# Patient Record
Sex: Female | Born: 1950
Health system: Southern US, Community
[De-identification: ages and names within clinical notes are randomized; demographics above are authoritative.]

## PROBLEM LIST (undated history)

## (undated) DIAGNOSIS — K219 Gastro-esophageal reflux disease without esophagitis: Secondary | ICD-10-CM

## (undated) DIAGNOSIS — M1990 Unspecified osteoarthritis, unspecified site: Secondary | ICD-10-CM

## (undated) DIAGNOSIS — G2581 Restless legs syndrome: Secondary | ICD-10-CM

## (undated) DIAGNOSIS — K589 Irritable bowel syndrome without diarrhea: Secondary | ICD-10-CM

## (undated) DIAGNOSIS — H269 Unspecified cataract: Secondary | ICD-10-CM

## (undated) DIAGNOSIS — Z98811 Dental restoration status: Secondary | ICD-10-CM

## (undated) DIAGNOSIS — M2041 Other hammer toe(s) (acquired), right foot: Secondary | ICD-10-CM

## (undated) DIAGNOSIS — G373 Acute transverse myelitis in demyelinating disease of central nervous system: Secondary | ICD-10-CM

## (undated) HISTORY — DX: Irritable bowel syndrome, unspecified: K58.9

## (undated) HISTORY — PX: COLONOSCOPY: SHX174

## (undated) HISTORY — PX: MIDDLE EAR SURGERY: SHX713

## (undated) HISTORY — DX: Acute transverse myelitis in demyelinating disease of central nervous system: G37.3

## (undated) HISTORY — DX: Restless legs syndrome: G25.81

## (undated) HISTORY — PX: SYNOVIAL CYST EXCISION: SUR507

## (undated) HISTORY — PX: POLYPECTOMY: SHX149

## (undated) HISTORY — DX: Gastro-esophageal reflux disease without esophagitis: K21.9

## (undated) HISTORY — PX: BUNIONECTOMY: SHX129

## (undated) HISTORY — PX: FOOT SURGERY: SHX648

---

## 2003-04-27 ENCOUNTER — Other Ambulatory Visit: Admission: RE | Admit: 2003-04-27 | Discharge: 2003-04-27 | Payer: Self-pay | Admitting: Obstetrics and Gynecology

## 2004-09-11 ENCOUNTER — Other Ambulatory Visit: Admission: RE | Admit: 2004-09-11 | Discharge: 2004-09-11 | Payer: Self-pay | Admitting: Obstetrics and Gynecology

## 2005-03-19 ENCOUNTER — Encounter: Admission: RE | Admit: 2005-03-19 | Discharge: 2005-03-19 | Payer: Self-pay | Admitting: *Deleted

## 2005-10-14 ENCOUNTER — Encounter: Admission: RE | Admit: 2005-10-14 | Discharge: 2005-10-14 | Payer: Self-pay | Admitting: Obstetrics and Gynecology

## 2005-11-25 ENCOUNTER — Other Ambulatory Visit: Admission: RE | Admit: 2005-11-25 | Discharge: 2005-11-25 | Payer: Self-pay | Admitting: Obstetrics and Gynecology

## 2006-10-15 ENCOUNTER — Encounter: Admission: RE | Admit: 2006-10-15 | Discharge: 2006-10-15 | Payer: Self-pay | Admitting: Obstetrics & Gynecology

## 2006-12-15 DIAGNOSIS — K219 Gastro-esophageal reflux disease without esophagitis: Secondary | ICD-10-CM

## 2006-12-15 HISTORY — DX: Gastro-esophageal reflux disease without esophagitis: K21.9

## 2007-01-06 ENCOUNTER — Other Ambulatory Visit: Admission: RE | Admit: 2007-01-06 | Discharge: 2007-01-06 | Payer: Self-pay | Admitting: Obstetrics & Gynecology

## 2007-02-01 ENCOUNTER — Ambulatory Visit: Payer: Self-pay | Admitting: Vascular Surgery

## 2007-02-11 ENCOUNTER — Ambulatory Visit: Payer: Self-pay | Admitting: Vascular Surgery

## 2007-04-01 ENCOUNTER — Ambulatory Visit: Payer: Self-pay | Admitting: Vascular Surgery

## 2007-10-18 ENCOUNTER — Encounter: Admission: RE | Admit: 2007-10-18 | Discharge: 2007-10-18 | Payer: Self-pay | Admitting: Obstetrics & Gynecology

## 2008-01-19 ENCOUNTER — Other Ambulatory Visit: Admission: RE | Admit: 2008-01-19 | Discharge: 2008-01-19 | Payer: Self-pay | Admitting: Obstetrics & Gynecology

## 2008-10-23 ENCOUNTER — Encounter: Admission: RE | Admit: 2008-10-23 | Discharge: 2008-10-23 | Payer: Self-pay | Admitting: Obstetrics & Gynecology

## 2009-02-01 ENCOUNTER — Other Ambulatory Visit: Admission: RE | Admit: 2009-02-01 | Discharge: 2009-02-01 | Payer: Self-pay | Admitting: Obstetrics and Gynecology

## 2011-10-28 ENCOUNTER — Encounter (HOSPITAL_BASED_OUTPATIENT_CLINIC_OR_DEPARTMENT_OTHER)
Admission: RE | Admit: 2011-10-28 | Discharge: 2011-10-28 | Disposition: A | Discharge status: Home / Self Care | Payer: BC Managed Care – PPO | Source: Ambulatory Visit | Attending: Orthopedic Surgery | Admitting: Orthopedic Surgery

## 2011-10-28 ENCOUNTER — Other Ambulatory Visit: Payer: Self-pay

## 2011-10-28 ENCOUNTER — Encounter (HOSPITAL_BASED_OUTPATIENT_CLINIC_OR_DEPARTMENT_OTHER): Payer: Self-pay | Admitting: *Deleted

## 2011-10-28 NOTE — H&P (Signed)
  HISTORY  OF  PRESENT  ILLNESS:  Gail Gilbert  presents  to  the  office  today in  consultation  from  Dr. Althea Charon for a left second toe hammertoe that has been present for the last year and has been somewhat progressive.  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  Otherwise, she is healthy.  She has had a couple of bunion corrections in the past, done by a podiatrist down in [facility].  She is here to discuss  getting  the hammertoe released.  It does have a dorsal  callous.   No  ulceration, but  it  bothers  her and prevents her from wearing most types of shoes.  No fevers or chills or wounds.  The toe next to it, the middle  toe on  the left foot,  does have  a small mallet  deformity.   She also  has  occasional numbness and tingling in the foot that sounds like an early Morton's neuroma, but it is not bad enough where she desires any specific treatment for it.  PAST MEDICAL HISTORY:  Restless leg syndrome, as well as bursitis in her right hip.  PAST SURGICAL HISTORY:  Bunionectomy x2 in both the right and the left foot in 2002 and 2009. Lower back surgery in addition to right ear surgery in 1999.  MEDICATIONS:  Meloxicam, omeprazole, Citrulline, Prometrium, Estradiol, Requip and a multivitamin.  ALLERGIES:  None.  FAMILY HISTORY:  Coronary artery disease, as well as cervical arthritis.  SOCIAL HISTORY:  The patient does drink alcohol occasionally.  She denies tobacco use.  She generally works as a Haematologist, but as mentioned above has been out of work since 01/04/2010.  REVIEW OF SYSTEMS:  An extensive review of systems was conducted and is significant for insomnia, heart palpitations, joint pains, muscle aches, arthritis, bladder control problems in addition to frequent and urgent urination, poor balance and anxiety.  The patient has been pregnant 4 times and has a total of 5 live births.  OBJECTIVE:  The patient is a well-developed and well-nourished female in no acute  distress.  Eyes not dilated.  No use of accessory muscles for breathing.  No rashes on the skin.  She is alert and oriented x3 The  left  second  toe  does  have  a  fairly  impressive  hammer  deformity  with  about  a  45  degree  fixed contracture  at  the  PIP  joint.   Neurovascular  status  is  intact.   No  cuts, scrapes,  or  abrasions  of  the  skin. There is a distal callous, but again it has not worn through.  IMPRESSION:  Left foot second toe hammertoe, fixed, with about a 40 degree flexion contracture and mild symptoms consistent with a Morton's neuroma that do not require surgical intervention at this time.  PLAN:   We  will  get  her  set  up  for  hammertoe  release  using  a  Duvries  arthroplasty  technique.  I  will reassess her in the office at the time of surgery.  I filled out a surgery sheet and gave it to [place] and she may call Olegario Messier to set up the surgery at her convenience.

## 2011-10-28 NOTE — Progress Notes (Signed)
To come in for ekg- 

## 2011-10-29 ENCOUNTER — Encounter (HOSPITAL_BASED_OUTPATIENT_CLINIC_OR_DEPARTMENT_OTHER)
Admission: RE | Disposition: A | Discharge status: Home / Self Care | Payer: Self-pay | Source: Ambulatory Visit | Attending: Orthopedic Surgery

## 2011-10-29 ENCOUNTER — Ambulatory Visit (HOSPITAL_BASED_OUTPATIENT_CLINIC_OR_DEPARTMENT_OTHER)
Admission: RE | Admit: 2011-10-29 | Discharge: 2011-10-29 | Disposition: A | Discharge status: Home / Self Care | Payer: BC Managed Care – PPO | Source: Ambulatory Visit | Attending: Orthopedic Surgery | Admitting: Orthopedic Surgery

## 2011-10-29 ENCOUNTER — Encounter (HOSPITAL_BASED_OUTPATIENT_CLINIC_OR_DEPARTMENT_OTHER): Payer: Self-pay | Admitting: Anesthesiology

## 2011-10-29 ENCOUNTER — Encounter (HOSPITAL_BASED_OUTPATIENT_CLINIC_OR_DEPARTMENT_OTHER): Payer: Self-pay | Admitting: *Deleted

## 2011-10-29 ENCOUNTER — Ambulatory Visit (HOSPITAL_BASED_OUTPATIENT_CLINIC_OR_DEPARTMENT_OTHER): Payer: BC Managed Care – PPO | Admitting: Anesthesiology

## 2011-10-29 DIAGNOSIS — M2040 Other hammer toe(s) (acquired), unspecified foot: Secondary | ICD-10-CM

## 2011-10-29 DIAGNOSIS — Z01810 Encounter for preprocedural cardiovascular examination: Secondary | ICD-10-CM | POA: Insufficient documentation

## 2011-10-29 HISTORY — PX: HAMMER TOE SURGERY: SHX385

## 2011-10-29 SURGERY — CORRECTION, HAMMER TOE
Anesthesia: Choice | Laterality: Left | Wound class: Clean

## 2011-10-29 MED ORDER — CEFAZOLIN SODIUM 1-5 GM-% IV SOLN
INTRAVENOUS | Status: DC | PRN
  Administered 2011-10-29: 1 g via INTRAVENOUS

## 2011-10-29 MED ORDER — CHLORHEXIDINE GLUCONATE 4 % EX LIQD: 60.0000 mL | Freq: Once | CUTANEOUS | Status: DC

## 2011-10-29 MED ORDER — PROPOFOL 10 MG/ML IV EMUL
INTRAVENOUS | Status: DC | PRN
  Administered 2011-10-29: 200 mg via INTRAVENOUS

## 2011-10-29 MED ORDER — BUPIVACAINE HCL (PF) 0.5 % IJ SOLN
INTRAMUSCULAR | Status: DC | PRN
  Administered 2011-10-29: 4 mL

## 2011-10-29 MED ORDER — DEXTROSE-NACL 5-0.45 % IV SOLN: INTRAVENOUS | Status: DC

## 2011-10-29 MED ORDER — HYDROCODONE-ACETAMINOPHEN 5-325 MG PO TABS: 1.0000 | ORAL_TABLET | ORAL | Status: AC | PRN

## 2011-10-29 MED ORDER — FENTANYL CITRATE 0.05 MG/ML IJ SOLN
INTRAMUSCULAR | Status: DC | PRN
  Administered 2011-10-29 (×2): 50 ug via INTRAVENOUS

## 2011-10-29 MED ORDER — MIDAZOLAM HCL 5 MG/5ML IJ SOLN
INTRAMUSCULAR | Status: DC | PRN
  Administered 2011-10-29: 2 mg via INTRAVENOUS

## 2011-10-29 MED ORDER — LIDOCAINE HCL (CARDIAC) 20 MG/ML IV SOLN
INTRAVENOUS | Status: DC | PRN
  Administered 2011-10-29: 60 mg via INTRAVENOUS

## 2011-10-29 MED ORDER — LACTATED RINGERS IV SOLN
INTRAVENOUS | Status: DC
  Administered 2011-10-29 (×3): via INTRAVENOUS

## 2011-10-29 MED ORDER — ONDANSETRON HCL 4 MG/2ML IJ SOLN
INTRAMUSCULAR | Status: DC | PRN
  Administered 2011-10-29: 4 mg via INTRAVENOUS

## 2011-10-29 MED ORDER — CEFAZOLIN SODIUM 1-5 GM-% IV SOLN: 1.0000 g | INTRAVENOUS | Status: DC

## 2011-10-29 MED ORDER — DEXAMETHASONE SODIUM PHOSPHATE 4 MG/ML IJ SOLN
INTRAMUSCULAR | Status: DC | PRN
  Administered 2011-10-29: 10 mg via INTRAVENOUS

## 2011-10-29 SURGICAL SUPPLY — 26 items
BANDAGE ELASTIC 3 VELCRO ST LF (GAUZE/BANDAGES/DRESSINGS) ×2 IMPLANT
BANDAGE ELASTIC 4 VELCRO ST LF (GAUZE/BANDAGES/DRESSINGS) ×2 IMPLANT
BANDAGE ELASTIC 6 VELCRO ST LF (GAUZE/BANDAGES/DRESSINGS) IMPLANT
BLADE OSC/SAG .038X5.5 CUT EDG (BLADE) ×2 IMPLANT
BLADE SURG 15 STRL LF DISP TIS (BLADE) ×1 IMPLANT
BLADE SURG 15 STRL SS (BLADE) ×1
BNDG ESMARK 4X9 LF (GAUZE/BANDAGES/DRESSINGS) ×2 IMPLANT
CHLORAPREP W/TINT 26ML (MISCELLANEOUS) ×2 IMPLANT
CLOTH BEACON ORANGE TIMEOUT ST (SAFETY) ×2 IMPLANT
COVER TABLE BACK 60X90 (DRAPES) ×2 IMPLANT
DRAPE EXTREMITY T 121X128X90 (DRAPE) ×2 IMPLANT
DRAPE U 20/CS (DRAPES) ×2 IMPLANT
GAUZE XEROFORM 1X8 LF (GAUZE/BANDAGES/DRESSINGS) ×2 IMPLANT
GLOVE BIO SURGEON STRL SZ7.5 (GLOVE) ×4 IMPLANT
GLOVE ECLIPSE 6.5 STRL STRAW (GLOVE) ×2 IMPLANT
GLOVE INDICATOR 8.0 STRL GRN (GLOVE) ×2 IMPLANT
GOWN PREVENTION PLUS XLARGE (GOWN DISPOSABLE) ×6 IMPLANT
NEEDLE HYPO 25X1 1.5 SAFETY (NEEDLE) ×2 IMPLANT
PACK BASIN DAY SURGERY FS (CUSTOM PROCEDURE TRAY) ×2 IMPLANT
PADDING CAST ABS 4INX4YD NS (CAST SUPPLIES) ×1
PADDING CAST ABS COTTON 4X4 ST (CAST SUPPLIES) ×1 IMPLANT
SPONGE GAUZE 4X4 12PLY (GAUZE/BANDAGES/DRESSINGS) ×2 IMPLANT
SUT ETHILON 3 0 PS 1 (SUTURE) ×2 IMPLANT
SYR BULB 3OZ (MISCELLANEOUS) ×2 IMPLANT
SYR CONTROL 10ML LL (SYRINGE) ×2 IMPLANT
TOWEL OR 17X26 4PK STRL BLUE (TOWEL DISPOSABLE) ×2 IMPLANT

## 2011-10-29 NOTE — Op Note (Deleted)
dictated [id number]

## 2011-10-29 NOTE — Brief Op Note (Signed)
10/29/2011  3:57 PM  PATIENT:  Rudy Jew  60 y.o. female  PRE-OPERATIVE DIAGNOSIS:  left 2nd toe hammer  POST-OPERATIVE DIAGNOSIS:  Left 2nd toe hammer  PROCEDURE:  Procedure(s): HAMMER TOE CORRECTION  SURGEON:  Surgeon(s): Nestor Lewandowsky  PHYSICIAN ASSISTANT: Mauricia Area  ASSISTANTS:    ANESTHESIA:   general  EBL:  Total I/O In: 600 [I.V.:600] Out: -   BLOOD ADMINISTERED:none  DRAINS: none   LOCAL MEDICATIONS USED:  NONE  SPECIMEN:  No Specimen  DISPOSITION OF SPECIMEN:  N/A  COUNTS:  YES  TOURNIQUET:  * Missing tourniquet times found for documented tourniquets in log:  8723 *  DICTATION: .Other Dictation: Dictation Number [id number]  PLAN OF CARE: Discharge to home after PACU  PATIENT DISPOSITION:  PACU - hemodynamically stable.   Delay start of Pharmacological VTE agent (>24hrs) due to surgical blood loss or risk of bleeding:  {YES/NO/NOT APPLICABLE:20182

## 2011-10-29 NOTE — Anesthesia Postprocedure Evaluation (Signed)
  Anesthesia Post-op Note  Patient: Gail Gilbert  Procedure(s) Performed:  HAMMER TOE CORRECTION - Left 2nd toe duvries arthroplasty  Patient Location: PACU  Anesthesia Type: General  Level of Consciousness: awake, alert  and oriented  Airway and Oxygen Therapy: Patient Spontanous Breathing  Post-op Pain: mild  Post-op Assessment: Post-op Vital signs reviewed, Patient's Cardiovascular Status Stable, Respiratory Function Stable, Patent Airway, No signs of Nausea or vomiting and Pain level controlled  Post-op Vital Signs: Reviewed and stable  Complications: No apparent anesthesia complications

## 2011-10-29 NOTE — Anesthesia Postprocedure Evaluation (Signed)
  Anesthesia Post-op Note  Patient: Gail Gilbert  Procedure(s) Performed:  HAMMER TOE CORRECTION - Left 2nd toe duvries arthroplasty  Patient Location: PACU  Anesthesia Type: General  Level of Consciousness: awake, alert  and oriented  Airway and Oxygen Therapy: Patient Spontanous Breathing  Post-op Pain: mild  Post-op Assessment: Post-op Vital signs reviewed, Patient's Cardiovascular Status Stable, Respiratory Function Stable and Pain level controlled  Post-op Vital Signs: Reviewed and stable  Complications: No apparent anesthesia complications

## 2011-10-29 NOTE — Anesthesia Procedure Notes (Addendum)
Performed by: Signa Kell   Procedure Name: LMA Insertion Date/Time: 10/29/2011 3:37 PM Performed by: Signa Kell Pre-anesthesia Checklist: Patient identified, Emergency Drugs available, Suction available and Patient being monitored Patient Re-evaluated:Patient Re-evaluated prior to inductionOxygen Delivery Method: Circle System Utilized Preoxygenation: Pre-oxygenation with 100% oxygen Intubation Type: IV induction Ventilation: Mask ventilation without difficulty LMA: LMA inserted LMA Size: 4.0 Number of attempts: 1 Airway Equipment and Method: bite block Placement Confirmation: positive ETCO2 Tube secured with: Tape Dental Injury: Teeth and Oropharynx as per pre-operative assessment

## 2011-10-29 NOTE — Anesthesia Postprocedure Evaluation (Signed)
  Anesthesia Post-op Note  Patient: Gail Gilbert  Procedure(s) Performed:  HAMMER TOE CORRECTION - Left 2nd toe duvries arthroplasty  Patient Location: PACU  Anesthesia Type: General  Level of Consciousness: awake and alert   Airway and Oxygen Therapy: Patient Spontanous Breathing  Post-op Pain: mild  Post-op Assessment: Post-op Vital signs reviewed, Patient's Cardiovascular Status Stable, Respiratory Function Stable, Patent Airway, No signs of Nausea or vomiting and Pain level controlled  Post-op Vital Signs: Reviewed and stable  Complications: No apparent anesthesia complications

## 2011-10-29 NOTE — Transfer of Care (Signed)
Immediate Anesthesia Transfer of Care Note  Patient: Gail Gilbert  Procedure(s) Performed:  HAMMER TOE CORRECTION - Left 2nd toe duvries arthroplasty  Patient Location: PACU  Anesthesia Type: General  Level of Consciousness: awake and oriented  Airway & Oxygen Therapy: Patient Spontanous Breathing and Patient connected to face mask  Post-op Assessment: Report given to PACU RN and Post -op Vital signs reviewed and stable  Post vital signs: Reviewed and stable  Complications: No apparent anesthesia complications

## 2011-10-29 NOTE — Anesthesia Preprocedure Evaluation (Addendum)
Anesthesia Evaluation  Patient identified by MRN, date of birth, ID band Patient awake    Reviewed: Allergy & Precautions, H&P , NPO status , Patient's Chart, lab work & pertinent test results  Airway       Dental   Pulmonary          Cardiovascular     Neuro/Psych PSYCHIATRIC DISORDERS    GI/Hepatic   Endo/Other    Renal/GU      Musculoskeletal   Abdominal   Peds  Hematology   Anesthesia Other Findings   Reproductive/Obstetrics                           Anesthesia Physical Anesthesia Plan  ASA: I  Anesthesia Plan: General   Post-op Pain Management:    Induction: Intravenous  Airway Management Planned: LMA  Additional Equipment:   Intra-op Plan:   Post-operative Plan: Extubation in OR  Informed Consent: I have reviewed the patients History and Physical, chart, labs and discussed the procedure including the risks, benefits and alternatives for the proposed anesthesia with the patient or authorized representative who has indicated his/her understanding and acceptance.   Dental advisory given  Plan Discussed with: CRNA and Surgeon  Anesthesia Plan Comments:         Anesthesia Quick Evaluation

## 2011-10-30 NOTE — Op Note (Signed)
NAME:  Gail Gilbert, Gail Gilbert                  ACCOUNT NO.:  [id number]  MEDICAL RECORD NO.:  LOCATION:                                 FACILITY:  PHYSICIAN:  Feliberto Gottron. Turner Daniels, M.D.        DATE OF BIRTH:  DATE OF PROCEDURE:  10/29/2011 DATE OF DISCHARGE:                              OPERATIVE REPORT   PREOPERATIVE DIAGNOSIS:  Left second hammer toe.  POSTOPERATIVE DIAGNOSIS:  Left second hammer toe.  PROCEDURE:  Left second hammertoe release.  SURGEON:  Feliberto Gottron. Turner Daniels, MD  FIRS ASSISTANT:  Shirl Harris, PA-C  ANESTHETIC:  General LMA.  ESTIMATED BLOOD LOSS:  Minimal.  FLUID REPLACEMENT:  600 mL of crystalloid.  DRAINS PLACED:  None.  TOURNIQUET TIME:  15 minutes.  INDICATIONS FOR PROCEDURE:  The patient is a 60 year old woman with a fixed left second hammer toe desires elective release.  She is developing callus on the dorsal aspect of the PIP joint even with loose shoe wear and is causing pain and disability.  Risks and benefits of surgery have been discussed.  Questions answered.  DESCRIPTION OF PROCEDURE:  The patient identified by armband and taken to the operating room 5 at [facility].  Appropriate anesthetic monitors were attached.  General LMA anesthesia induced. With the patient in supine position, tourniquet applied to the left ankle. Left lower extremity was prepped and draped in usual fashion from the toes to the tourniquet.  Time-out procedure performed.  Limb wrapped with an Esmarch bandage, tourniquet inflated to 250 mmHg and we began the operation itself by making an elliptical incision over the left second toe PIP joint removing an ellipse medially to the mid axis laterally of the PIP joint, and the width of the ellipse was about 8 mm. This was a full-thickness ellipse including skin, subcutaneous tissue, and the extensor tendon.  After this has been removed, the condyles of the distal aspect of the proximal phalanx were removed with a small  ACL saw perpendicular to the axis of the PIP.  We then did a provisional reduction of the PIP joint and found to be in excellent condition.  More of the dorsal structures were released with a Therapist, nutritional along the proximal phalanx towards the MTP joint and the toe was now quite flexible at the MTP joint.  The wound was irrigated out with normal saline solution, and we closed dorsally with 3-0 vertical mattress sutures x4 obtaining good dermatodesis and eliminating the hammer toe.  At this point, a dressing of 4 x 4 __________ between the toes 4x4s dorsally and plantarly, 2 inch Webril and a 3 inch Ace wrap were applied.  Tourniquet was let down. The toes pinked up.  The patient was awakened, extubated and taken to recovery room without difficulty.     Feliberto Gottron. Turner Daniels, M.D.     Ovid Curd  D:  10/29/2011  T:  10/29/2011  Job:  [id number]

## 2011-10-30 NOTE — Op Note (Signed)
Pre-operative Diagnosis:Pre-Op Diagnosis Codes:    * Hammer toe [735.3]  Post-operative Diagnosis: same  Surgeon: Surgeon(s) and Role:    * Nestor Lewandowsky - Primary   Procedure and Anesthesia:  Procedure(s) and Anesthesia Type:    * HAMMER TOE CORRECTION - Choice  ASA Class: 1  Patient was taken to the operating room at [facility]. The appropriate anesthetic monitors were attached and general LMA anesthesia was induced with the patient in the supine position. Tourniquet was applied to the left ankle and left foot prepped and draped in usual sterile fashion from the toes to the tourniquet. A time out procedure was performed. The limb was then wrapped with an Esmarch bandage the tourniquet was inflated to 250 mmHg. We began the operation by making an elliptical incision over the second toe PIP joint full-thickness through the skin subcutaneous tissue and extensor tendons exposed and the distal condyles of the proximal phalanx. The distal condyles were then removed with a small oscillating saw. We provisionally approximated the skin edges of the ellipse together reducing the hammertoe deformity. Satisfied with the appearance of the toe the skin edges were then fixed using 4 vertical mattress 3-0 nylon sutures. A dressing of 0 4 x 4 sponges between the toes 2 inch web roll and 2 inch Ace wrap was then applied the tourniquet was let down the patient was awakened extubated and taken to the recovery without difficulty.

## 2011-11-03 ENCOUNTER — Encounter (HOSPITAL_BASED_OUTPATIENT_CLINIC_OR_DEPARTMENT_OTHER): Payer: Self-pay | Admitting: Orthopedic Surgery

## 2012-01-08 ENCOUNTER — Ambulatory Visit: Payer: Self-pay | Admitting: Physician Assistant

## 2012-01-08 DIAGNOSIS — R079 Chest pain, unspecified: Secondary | ICD-10-CM

## 2012-03-10 ENCOUNTER — Telehealth: Payer: Self-pay

## 2012-03-10 NOTE — Telephone Encounter (Signed)
CHELLE  PT IS IN [place] UNDER A LOT OF STRESS AND ANXIETY REQUESTING SOMETHING TO HELP HER DEAL WITH THIS  CALL [phone]

## 2012-03-11 NOTE — Telephone Encounter (Signed)
Chelle,  Do you need the chart?  Can you recommend anything for patient?

## 2012-03-11 NOTE — Telephone Encounter (Signed)
I'll need the paper chart to review before I can make a recommendation, but I'll also need more details about the stress she having, her symptoms, when she expects to return from [place], etc.  Thanks.

## 2012-03-12 ENCOUNTER — Other Ambulatory Visit: Payer: Self-pay | Admitting: Family Medicine

## 2012-03-12 MED ORDER — CLONAZEPAM 1 MG PO TABS: ORAL_TABLET | ORAL | Status: DC

## 2012-03-12 NOTE — Telephone Encounter (Signed)
LMOM to CB. 

## 2012-03-12 NOTE — Telephone Encounter (Signed)
Clonazepam 1 mg, 1/2-1 PO Q8hours prn anxiety, #90, no refills. Please call to he pharmacy of her choice.

## 2012-03-12 NOTE — Telephone Encounter (Signed)
Please call the patient and get the details of her stress and anxiety (please see my previous message).

## 2012-03-12 NOTE — Telephone Encounter (Signed)
Chelle chart in your box.

## 2012-03-12 NOTE — Telephone Encounter (Signed)
Spoke with patient she states that she will be in [place] for 3 weeks visiting mother her stress is due from a 4 yr relationship and found out mate was cheating on her they have concealing set up butit'll  take a while before anything gets better its hard for her to stay sleep at night please respond back

## 2012-03-12 NOTE — Telephone Encounter (Signed)
Patient called back and rx called into below pharmacy.

## 2012-05-18 ENCOUNTER — Other Ambulatory Visit: Payer: Self-pay | Admitting: Physician Assistant

## 2012-05-25 ENCOUNTER — Other Ambulatory Visit: Payer: Self-pay | Admitting: Physician Assistant

## 2012-05-25 MED ORDER — ESTRADIOL 1 MG PO TABS: 1.5000 mg | ORAL_TABLET | Freq: Every day | ORAL | Status: DC

## 2012-05-29 ENCOUNTER — Other Ambulatory Visit: Payer: Self-pay | Admitting: Physician Assistant

## 2012-06-11 ENCOUNTER — Telehealth: Payer: Self-pay

## 2012-06-11 MED ORDER — MEDROXYPROGESTERONE ACETATE 5 MG PO TABS: 5.0000 mg | ORAL_TABLET | Freq: Every day | ORAL | Status: DC

## 2012-06-11 NOTE — Telephone Encounter (Signed)
Sent rx to pharmacy

## 2012-06-11 NOTE — Telephone Encounter (Signed)
Pt pharmacy called, pt needs refill on her medroxy. Can we send in refills? Please advise

## 2012-06-12 NOTE — Telephone Encounter (Signed)
LMOM notifying patient.

## 2012-07-06 ENCOUNTER — Telehealth: Payer: Self-pay

## 2012-07-06 NOTE — Telephone Encounter (Signed)
Per pt, Chelle prescribes her Medroxy PR. She just filled the last one. Would like to know if she needs to RTC or if we will refill. New pharmacy is [facility] in [place] (no # avail, per pt this is the only [facility] in [place])  [place]   [phone]

## 2012-07-07 NOTE — Telephone Encounter (Signed)
Chelle, is this something you can refill or do we need to have her come into clinic?

## 2012-07-08 NOTE — Telephone Encounter (Signed)
I'll need to see her for refills at this point.  Please advise her.

## 2012-07-09 NOTE — Telephone Encounter (Signed)
Spoke with pt advised pt to RTC. Pt understood

## 2012-08-05 ENCOUNTER — Encounter: Payer: Self-pay | Admitting: Physician Assistant

## 2012-08-05 ENCOUNTER — Ambulatory Visit: Payer: Self-pay | Admitting: Physician Assistant

## 2012-08-05 VITALS — BP 102/72 | HR 80 | Temp 98.3°F | Resp 16 | Ht 67.5 in | Wt 136.0 lb

## 2012-08-05 DIAGNOSIS — G2581 Restless legs syndrome: Secondary | ICD-10-CM | POA: Insufficient documentation

## 2012-08-05 DIAGNOSIS — F419 Anxiety disorder, unspecified: Secondary | ICD-10-CM | POA: Insufficient documentation

## 2012-08-05 DIAGNOSIS — M4310 Spondylolisthesis, site unspecified: Secondary | ICD-10-CM | POA: Insufficient documentation

## 2012-08-05 DIAGNOSIS — Q762 Congenital spondylolisthesis: Secondary | ICD-10-CM

## 2012-08-05 DIAGNOSIS — N63 Unspecified lump in unspecified breast: Secondary | ICD-10-CM

## 2012-08-05 DIAGNOSIS — N644 Mastodynia: Secondary | ICD-10-CM

## 2012-08-05 MED ORDER — MELOXICAM 15 MG PO TABS: 15.0000 mg | ORAL_TABLET | Freq: Every day | ORAL | Status: DC

## 2012-08-05 NOTE — Patient Instructions (Signed)
Schedule a mammogram and eye appointments at your earliest convenience!

## 2012-08-05 NOTE — Progress Notes (Signed)
  Subjective:    Patient ID: Gail Gilbert, female    DOB: 1951/05/28, 61 y.o.   MRN: [id number]  HPI  This 61 y.o. female presents for evaluation of a right breast lump, and development of a floater in the right eye.  Does have vision insurance, and plans to see an eye specialist when she returns to [place] next week.  She's had a breast lump since 2006 (US showed calcium deposited in the fatty tissue, mammograms were normal through 2009, but no evaluation since then).  The lump became painful about a month ago.  No change in the size of the lump.    Review of Systems No chest pain, SOB, HA, dizziness, vision change, N/V, diarrhea, constipation, dysuria, urinary urgency or frequency, myalgias, arthralgias or rash.     Objective:   Physical Exam  Blood pressure 102/72, pulse 80, temperature 98.3 F (36.8 C), resp. rate 16, height 5' 7.5" (1.715 m), weight 136 lb (61.689 kg). Body mass index is 20.99 kg/(m^2). Well-developed, well nourished WF who is awake, alert and oriented, in NAD. HEENT: [place]/AT, sclera and conjunctiva are clear.   Neck: supple, non-tender, no lymphadenopathy, thyromegaly. Heart: RRR, no murmur Lungs: normal effort, CTA Skin: warm and dry without rash.     Assessment & Plan:   1. Anxiety  controlled  2. Breast lump  Mammogram and possible [place].  She will schedule upon her return to [place] next week.  3. Mastalgia  As above.  4. Spondylolisthesis  meloxicam (MOBIC) 15 MG tablet

## 2012-10-28 ENCOUNTER — Other Ambulatory Visit: Payer: Self-pay | Admitting: Physician Assistant

## 2012-11-27 ENCOUNTER — Other Ambulatory Visit: Payer: Self-pay | Admitting: Physician Assistant

## 2012-12-15 DIAGNOSIS — G373 Acute transverse myelitis in demyelinating disease of central nervous system: Secondary | ICD-10-CM

## 2012-12-15 HISTORY — DX: Acute transverse myelitis in demyelinating disease of central nervous system: G37.3

## 2013-02-19 ENCOUNTER — Inpatient Hospital Stay (HOSPITAL_COMMUNITY)
Admission: EM | Admit: 2013-02-19 | Discharge: 2013-03-01 | DRG: 099 | Payer: Medicaid Other | Attending: Family Medicine | Admitting: Family Medicine

## 2013-02-19 DIAGNOSIS — K219 Gastro-esophageal reflux disease without esophagitis: Secondary | ICD-10-CM | POA: Diagnosis present

## 2013-02-19 DIAGNOSIS — M47817 Spondylosis without myelopathy or radiculopathy, lumbosacral region: Secondary | ICD-10-CM | POA: Diagnosis present

## 2013-02-19 DIAGNOSIS — K5900 Constipation, unspecified: Secondary | ICD-10-CM | POA: Diagnosis present

## 2013-02-19 DIAGNOSIS — F411 Generalized anxiety disorder: Secondary | ICD-10-CM | POA: Diagnosis present

## 2013-02-19 DIAGNOSIS — G373 Acute transverse myelitis in demyelinating disease of central nervous system: Principal | ICD-10-CM

## 2013-02-19 DIAGNOSIS — F329 Major depressive disorder, single episode, unspecified: Secondary | ICD-10-CM | POA: Diagnosis present

## 2013-02-19 DIAGNOSIS — M4310 Spondylolisthesis, site unspecified: Secondary | ICD-10-CM

## 2013-02-19 DIAGNOSIS — R259 Unspecified abnormal involuntary movements: Secondary | ICD-10-CM | POA: Diagnosis present

## 2013-02-19 DIAGNOSIS — F419 Anxiety disorder, unspecified: Secondary | ICD-10-CM

## 2013-02-19 DIAGNOSIS — G0491 Myelitis, unspecified: Secondary | ICD-10-CM

## 2013-02-19 DIAGNOSIS — M47812 Spondylosis without myelopathy or radiculopathy, cervical region: Secondary | ICD-10-CM | POA: Diagnosis present

## 2013-02-19 DIAGNOSIS — R29898 Other symptoms and signs involving the musculoskeletal system: Secondary | ICD-10-CM

## 2013-02-19 DIAGNOSIS — G2581 Restless legs syndrome: Secondary | ICD-10-CM

## 2013-02-19 DIAGNOSIS — M19079 Primary osteoarthritis, unspecified ankle and foot: Secondary | ICD-10-CM | POA: Diagnosis present

## 2013-02-19 DIAGNOSIS — G43909 Migraine, unspecified, not intractable, without status migrainosus: Secondary | ICD-10-CM | POA: Diagnosis present

## 2013-02-19 DIAGNOSIS — Q762 Congenital spondylolisthesis: Secondary | ICD-10-CM

## 2013-02-19 DIAGNOSIS — R55 Syncope and collapse: Secondary | ICD-10-CM | POA: Diagnosis present

## 2013-02-19 DIAGNOSIS — I059 Rheumatic mitral valve disease, unspecified: Secondary | ICD-10-CM | POA: Diagnosis present

## 2013-02-19 DIAGNOSIS — F3289 Other specified depressive episodes: Secondary | ICD-10-CM | POA: Diagnosis present

## 2013-02-19 DIAGNOSIS — Z79899 Other long term (current) drug therapy: Secondary | ICD-10-CM

## 2013-02-19 DIAGNOSIS — N3010 Interstitial cystitis (chronic) without hematuria: Secondary | ICD-10-CM | POA: Diagnosis present

## 2013-02-19 DIAGNOSIS — R339 Retention of urine, unspecified: Secondary | ICD-10-CM

## 2013-02-19 NOTE — ED Notes (Signed)
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 Began experiencing weakness, tingling/numbness in feet, legs and right arm that started this afternoon, worse over the last hour. Neuro intact, gait steady. Pt voices hx of degenerative disk and osteoporosis. Alert/oriented

## 2013-02-19 NOTE — ED Notes (Signed)
ZOX:[id number]<AV> Expected date:02/19/13<BR> Expected time: 9:43 PM<BR> Means of arrival:Ambulance<BR> Comments:<BR> EMS: has arrived.

## 2013-02-20 ENCOUNTER — Emergency Department (HOSPITAL_COMMUNITY): Payer: Medicaid Other

## 2013-02-20 ENCOUNTER — Inpatient Hospital Stay (HOSPITAL_COMMUNITY): Payer: Medicaid Other

## 2013-02-20 ENCOUNTER — Encounter (HOSPITAL_COMMUNITY): Payer: Self-pay | Admitting: Family Medicine

## 2013-02-20 DIAGNOSIS — G2581 Restless legs syndrome: Secondary | ICD-10-CM

## 2013-02-20 DIAGNOSIS — Q762 Congenital spondylolisthesis: Secondary | ICD-10-CM

## 2013-02-20 DIAGNOSIS — F411 Generalized anxiety disorder: Secondary | ICD-10-CM

## 2013-02-20 DIAGNOSIS — R29898 Other symptoms and signs involving the musculoskeletal system: Secondary | ICD-10-CM | POA: Diagnosis present

## 2013-02-20 LAB — BASIC METABOLIC PANEL
CO2: 25 mEq/L (ref 19–32)
Calcium: 9.1 mg/dL (ref 8.4–10.5)
Chloride: 101 mEq/L (ref 96–112)
Glucose, Bld: 124 mg/dL — ABNORMAL HIGH (ref 70–99)
Potassium: 3.8 mEq/L (ref 3.5–5.1)
Sodium: 136 mEq/L (ref 135–145)

## 2013-02-20 LAB — CBC
Hemoglobin: 13.5 g/dL (ref 12.0–15.0)
MCH: 31.5 pg (ref 26.0–34.0)
MCHC: 35.2 g/dL (ref 30.0–36.0)
MCV: 89.7 fL (ref 78.0–100.0)
RBC: 4.28 MIL/uL (ref 3.87–5.11)

## 2013-02-20 LAB — URINALYSIS, ROUTINE W REFLEX MICROSCOPIC
Glucose, UA: NEGATIVE mg/dL
Leukocytes, UA: NEGATIVE
Specific Gravity, Urine: 1.02 (ref 1.005–1.030)
pH: 7 (ref 5.0–8.0)

## 2013-02-20 LAB — CBC WITH DIFFERENTIAL/PLATELET
Eosinophils Relative: 2 % (ref 0–5)
HCT: 36.3 % (ref 36.0–46.0)
Hemoglobin: 12.6 g/dL (ref 12.0–15.0)
Lymphocytes Relative: 18 % (ref 12–46)
Lymphs Abs: 1.2 10*3/uL (ref 0.7–4.0)
MCH: 31.2 pg (ref 26.0–34.0)
MCHC: 34.7 g/dL (ref 30.0–36.0)
Neutro Abs: 4.8 10*3/uL (ref 1.7–7.7)
Neutrophils Relative %: 74 % (ref 43–77)
RDW: 12.7 % (ref 11.5–15.5)

## 2013-02-20 LAB — MAGNESIUM: Magnesium: 2.1 mg/dL (ref 1.5–2.5)

## 2013-02-20 LAB — URINE MICROSCOPIC-ADD ON

## 2013-02-20 MED ORDER — LORAZEPAM 2 MG/ML IJ SOLN
1.0000 mg | Freq: Once | INTRAMUSCULAR | Status: AC
  Administered 2013-02-20: 1 mg via INTRAVENOUS
  Filled 2013-02-20: qty 1

## 2013-02-20 MED ORDER — SODIUM CHLORIDE 0.9 % IV SOLN
500.0000 mg | Freq: Two times a day (BID) | INTRAVENOUS | Status: DC
  Administered 2013-02-20 – 2013-02-22 (×4): 500 mg via INTRAVENOUS
  Filled 2013-02-20 (×8): qty 4

## 2013-02-20 MED ORDER — ZOLPIDEM TARTRATE 5 MG PO TABS
5.0000 mg | ORAL_TABLET | Freq: Every evening | ORAL | Status: DC | PRN
  Administered 2013-02-20 – 2013-02-28 (×6): 5 mg via ORAL
  Filled 2013-02-20 (×6): qty 1

## 2013-02-20 MED ORDER — MELOXICAM 15 MG PO TABS
15.0000 mg | ORAL_TABLET | Freq: Every day | ORAL | Status: DC
  Administered 2013-02-20 – 2013-03-01 (×10): 15 mg via ORAL
  Filled 2013-02-20 (×10): qty 1

## 2013-02-20 MED ORDER — GADOBENATE DIMEGLUMINE 529 MG/ML IV SOLN: 12.0000 mL | Freq: Once | INTRAVENOUS | Status: DC

## 2013-02-20 MED ORDER — LORAZEPAM 2 MG/ML IJ SOLN
2.0000 mg | Freq: Once | INTRAMUSCULAR | Status: AC
  Administered 2013-02-20: 2 mg via INTRAVENOUS
  Filled 2013-02-20: qty 1

## 2013-02-20 MED ORDER — ENOXAPARIN SODIUM 40 MG/0.4ML ~~LOC~~ SOLN
40.0000 mg | SUBCUTANEOUS | Status: DC
  Administered 2013-02-20: 40 mg via SUBCUTANEOUS
  Filled 2013-02-20: qty 0.4

## 2013-02-20 MED ORDER — VITAMIN D3 25 MCG (1000 UNIT) PO TABS
1000.0000 [IU] | ORAL_TABLET | Freq: Every morning | ORAL | Status: DC
  Administered 2013-02-20 – 2013-03-01 (×10): 1000 [IU] via ORAL
  Filled 2013-02-20 (×10): qty 1

## 2013-02-20 MED ORDER — DEXAMETHASONE SODIUM PHOSPHATE 10 MG/ML IJ SOLN: 6.0000 mg | Freq: Once | INTRAMUSCULAR | Status: DC

## 2013-02-20 MED ORDER — SODIUM CHLORIDE 0.9 % IJ SOLN
3.0000 mL | Freq: Two times a day (BID) | INTRAMUSCULAR | Status: DC
  Administered 2013-02-20 – 2013-02-28 (×14): 3 mL via INTRAVENOUS

## 2013-02-20 MED ORDER — SODIUM CHLORIDE 0.9 % IV SOLN
500.0000 mg | Freq: Once | INTRAVENOUS | Status: DC
  Administered 2013-02-20: 500 mg via INTRAVENOUS
  Filled 2013-02-20: qty 4

## 2013-02-20 MED ORDER — MELATONIN 5 MG PO CAPS
1.0000 | ORAL_CAPSULE | Freq: Every evening | ORAL | Status: DC | PRN
Start: 2013-02-20 — End: 2013-02-20
  Filled 2013-02-20: qty 1

## 2013-02-20 MED ORDER — GADOBENATE DIMEGLUMINE 529 MG/ML IV SOLN
13.0000 mL | Freq: Once | INTRAVENOUS | Status: AC | PRN
  Administered 2013-02-20: 13 mL via INTRAVENOUS

## 2013-02-20 NOTE — ED Notes (Signed)
Received call from MRI, states pt is unable to remain still for MRI, EDP aware

## 2013-02-20 NOTE — ED Provider Notes (Signed)
History     CSN: [id number]  Arrival date & time 02/19/13  2143   First MD Initiated Contact with Patient 02/20/13 0017      Chief Complaint  Patient presents with  . Numbness    (Consider location/radiation/quality/duration/timing/severity/associated sxs/prior treatment) HPI Comments: Patient with history of degenerative disc disease presents with several hours of worsening decreased sensation numbness in her bilateral lower extremities is associated weakness after painting earlier today. Weakness is progressive. Patient had called the ambulance because she is unable to walk or support herself. Numbness extends proximally to the groin. No urinary retention/incontinence but patient has not urinated since symptoms began. No fecal incontinence. No abdominal or back pain. Patient also notes some mild tingling in her left hand of the fifth digit. Onset gradual. Course is worsening. Nothing makes symptoms better or worse.   The history is provided by the patient.    Past Medical History  Diagnosis Date  . Arthritis     cervical,feet  . Depression   . Migraine   . GERD (gastroesophageal reflux disease) 12/2006    esophageal stricture  . Vitamin d deficiency   . MVP (mitral valve prolapse)   . Spondylolisthesis     L4-5  . RLS (restless legs syndrome)   . Anxiety   . Interstitial cystitis   . IBS (irritable bowel syndrome)   . Insomnia   . DVT (deep vein thrombosis) in pregnancy 1989    postpartum    Past Surgical History  Procedure Laterality Date  . Colonoscopy    . Bunionectomy  09,05    both feet  . Upper gastrointestinal endoscopy    . Ear examination under anesthesia      to correct ringing in ear at [facility]  . Lumbar aspiration and drainage      cyst  . Hammer toe surgery  10/29/2011    Procedure: HAMMER TOE CORRECTION;  Surgeon: Nestor Lewandowsky;  Location: [facility];  Service: Orthopedics;  Laterality: Left;  Left 2nd toe duvries arthroplasty     Family History  Problem Relation Age of Onset  . Arthritis Mother     spinal stenosis  . Cataracts Mother   . Heart disease Mother     pacemaker  . Hypertension Father   . Heart disease Father     pacer/defibrillator    History  Substance Use Topics  . Smoking status: Never Smoker   . Smokeless tobacco: Never Used  . Alcohol Use: Yes    OB History   Grav Para Term Preterm Abortions TAB SAB Ect Mult Living                  Review of Systems  Constitutional: Negative for fever and unexpected weight change.  HENT: Negative for congestion, rhinorrhea, neck pain, neck stiffness, dental problem and sinus pressure.   Eyes: Negative for photophobia, discharge, redness and visual disturbance.  Respiratory: Negative for shortness of breath.   Cardiovascular: Negative for chest pain.  Gastrointestinal: Negative for nausea, vomiting and constipation.       Negative for fecal incontinence.   Genitourinary: Negative for dysuria, hematuria, flank pain, vaginal bleeding, vaginal discharge and pelvic pain.       Negative for urinary incontinence or retention.  Musculoskeletal: Negative for back pain and gait problem.  Skin: Negative for rash.  Neurological: Positive for weakness and numbness. Negative for syncope, speech difficulty, light-headedness and headaches.       Denies saddle paresthesias.  Psychiatric/Behavioral: Negative  for confusion.    Allergies  Antihistamines, diphenhydramine-type  Home Medications   Current Outpatient Rx  Name  Route  Sig  Dispense  Refill  . cholecalciferol (VITAMIN D) 1000 UNITS tablet   Oral   Take 1,000 Units by mouth every morning.         . fish oil-omega-3 fatty acids 1000 MG capsule   Oral   Take 2 g by mouth every morning.          . Melatonin 5 MG CAPS   Oral   Take 1 capsule by mouth at bedtime as needed (sleep).         . meloxicam (MOBIC) 15 MG tablet   Oral   Take 15 mg by mouth every morning.         .  Multiple Vitamin (MULTIVITAMIN) capsule   Oral   Take 1 capsule by mouth daily.           . sertraline (ZOLOFT) 100 MG tablet   Oral   Take 50 mg by mouth every morning.           BP 125/66  Pulse 73  Temp(Src) 98.7 F (37.1 C) (Oral)  Resp 18  SpO2 100%  Physical Exam  Nursing note and vitals reviewed. Constitutional: She is oriented to person, place, and time. She appears well-developed and well-nourished.  HENT:  Head: Normocephalic and atraumatic.  Right Ear: Tympanic membrane, external ear and ear canal normal.  Left Ear: Tympanic membrane, external ear and ear canal normal.  Nose: Nose normal.  Mouth/Throat: Uvula is midline, oropharynx is clear and moist and mucous membranes are normal.  Eyes: Conjunctivae, EOM and lids are normal. Pupils are equal, round, and reactive to light. Right eye exhibits no discharge. Left eye exhibits no discharge. Right eye exhibits no nystagmus. Left eye exhibits no nystagmus.  Neck: Normal range of motion. Neck supple.  Cardiovascular: Normal rate, regular rhythm and normal heart sounds.   Pulmonary/Chest: Effort normal and breath sounds normal.  Abdominal: Soft. There is no tenderness.  Genitourinary: Rectal exam shows anal tone abnormal (no tone). Rectal exam shows no mass.  Musculoskeletal:       Cervical back: She exhibits normal range of motion, no tenderness and no bony tenderness.       Thoracic back: She exhibits no tenderness and no bony tenderness.       Lumbar back: She exhibits no tenderness and no bony tenderness.  Neurological: She is alert and oriented to person, place, and time. A sensory deficit (bilateral lower extremities) is present. No cranial nerve deficit. She exhibits abnormal muscle tone. Gait abnormal. Coordination normal. GCS eye subscore is 4. GCS verbal subscore is 5. GCS motor subscore is 6.  Reflex Scores:      Patellar reflexes are 4+ on the right side and 3+ on the left side. 3/5 flexor/extensor strength  bilateral lower extremities; 4/5 strength right upper extremity.   Skin: Skin is warm and dry.  Psychiatric: She has a normal mood and affect.    ED Course  Procedures (including critical care time)  Labs Reviewed  BASIC METABOLIC PANEL - Abnormal; Notable for the following:    Glucose, Bld 124 (*)    All other components within normal limits  URINALYSIS, ROUTINE W REFLEX MICROSCOPIC - Abnormal; Notable for the following:    APPearance CLOUDY (*)    Hgb urine dipstick SMALL (*)    All other components within normal limits  CBC WITH DIFFERENTIAL  URINE MICROSCOPIC-ADD ON  MAGNESIUM  TSH  T4, FREE   No results found.   1. Lower extremity weakness     12:45 AM Patient seen and examined. Dr. Read Drivers and Dr. Amada Jupiter involved in care immediately. I have spoken with radiologist and am trying to arrange for emergent MRI. Patient went to void, however states she is unable to urinate. + urinary retention.     Rectal exam performed with nurse chaperone. No increase of rectal tone with bearing down. No stool in vault. No masses.   Vital signs reviewed and are as follows: Filed Vitals:   02/19/13 2144  BP: 125/66  Pulse: 73  Temp: 98.7 F (37.1 C)  Resp: 18   1:24 AM To [facility] for emergent MRI. Dr. Read Drivers has spoken with Dr. Norlene Campbell at [facility] ED.   CRITICAL CARE Performed by: Carolee Rota   Total critical care time: 45  Critical care time was exclusive of separately billable procedures and treating other patients.  Critical care was necessary to treat or prevent imminent or life-threatening deterioration.  Critical care was time spent personally by me on the following activities: development of treatment plan with patient and/or surrogate as well as nursing, discussions with consultants, evaluation of patient's response to treatment, examination of patient, obtaining history from patient or surrogate, ordering and performing treatments and interventions, ordering and  review of laboratory studies, ordering and review of radiographic studies, pulse oximetry and re-evaluation of patient's condition.    MDM  Patient d/c to [facility] for emergent imaging and admission.         Renne Crigler, PA-C 02/20/13 [phone]

## 2013-02-20 NOTE — ED Notes (Addendum)
Pt returned from MRI. Pt oriented x4, arousable to verbal stimulation. Pt continues to have intermittent spasms in BUE and BLE. Pt's hand grips slightly weaker compared to previous assessment. Daughter at bedside, pt in NAD at this time.

## 2013-02-20 NOTE — ED Notes (Addendum)
Received pt from [facility]. Per [facility] RN, pt has numbness bilateral lower extremities from toes to hips, reports bilateral hands "don't feel normal, but are not numb now." Per RN pt has no other neuro deficits, solumedrol running at this time. 18g left forearm. Pt alert and interactive at this time. BP123/69 102bpm, 20RR. 98%RA, S1S2 heard, lungs CTA, denies pain.

## 2013-02-20 NOTE — ED Provider Notes (Signed)
Medical screening examination/treatment/procedure(s) were conducted as a shared visit with non-physician practitioner(s) and myself.  I personally evaluated the patient during patient the encounter  Patient was near complete paraplegia and paresis of the right upper study. Patient awake, alert, oriented and appropriate. We'll arrange for emergent MRI of the spinal cord as well as an urgent consultation to neurology.  Hanley Seamen, MD 02/20/13 [phone]

## 2013-02-20 NOTE — H&P (Signed)
Seen and examined.  Discussed with Dr. Armen Pickup.  Greatly appreciate neuro help.  Briefly,  Ms Gail Gilbert is a 62 yo female with chronic low back pain presents with bilateral leg weakness and urinary retention.  These are red flag symptoms for a spinal cord process.  She has had MRI imaging of her full cord and there is no mechanical compression.  So, our working diagnosis is transverse myelitis.  She is receiving major dose of glucocorticoids as treatment.  We shall follow her clinically.

## 2013-02-20 NOTE — ED Notes (Signed)
Solumedrol completed 

## 2013-02-20 NOTE — ED Notes (Signed)
Dr. Amada Jupiter paged, states does not want to give more ativan for MRI, states will contact MRI and notify to end scan now.

## 2013-02-20 NOTE — ED Notes (Signed)
Pt given 2mg  Ativan IV, approx 1 minute later, pt SpO2 dropped to 89%, pt placed on 2LNC. Pt able to follow commands, oriented x4. PMS remained intact in BLE and BUE.

## 2013-02-20 NOTE — H&P (Signed)
Gail Gilbert is an 62 y.o. female.   PCP: previously  [facility], currently unassigned.  Historian: patient and her daughter in law. Patient has received ativan for MRI and is sedated.  Chief Complaint: bilateral leg tingling/numbness and weakness  HPI:  62 yo F with history of low back pain s/p L4 decompression laminectomy in 1999, anxiety, insomnia, restless leg syndrome and osteopenia presents with acute onset of tingling in her feet starting  3/8 afternoon, 2-3 hrs later she developed leg weakness and staggering gait as well as RUE numbness and weakness. She called EMS and presented to [facility] ED. She denies weakness in other areas, visual changes, slurred speech and SOB. She also denies recent fever, respiratory/GI illness. She denies recent medications changes and drug use.  She reports that she started to experience urinary retention upon presenting to the ED. She denies fecal and urinary incontinence. She reports that her R arms numbness and weakness has improved. She reports similar symptoms that were note as severe following her back surgery in 1999.   Of note, she recently moved back to [place] for the [place], [place] area about two weeks ago.   Past Medical History  Diagnosis Date  . Arthritis     cervical,feet  . Depression   . Migraine   . GERD (gastroesophageal reflux disease) 12/2006    esophageal stricture  . Vitamin D deficiency   . MVP (mitral valve prolapse)   . Spondylolisthesis     L4-5  . RLS (restless legs syndrome)   . Anxiety   . Interstitial cystitis   . IBS (irritable bowel syndrome)   . Insomnia   . DVT (deep vein thrombosis) in pregnancy 1989    postpartum    Past Surgical History  Procedure Laterality Date  . Colonoscopy    . Bunionectomy  09,05    both feet  . Upper gastrointestinal endoscopy    . Ear examination under anesthesia      to correct ringing in ear at [facility]  . Lumbar aspiration and drainage      cyst  . Hammer toe surgery  10/29/2011     Procedure: HAMMER TOE CORRECTION;  Surgeon: Nestor Lewandowsky;  Location: [facility];  Service: Orthopedics;  Laterality: Left;  Left 2nd toe duvries arthroplasty  . Decompressive lumbar laminectomy level 4  1999    Family History  Problem Relation Age of Onset  . Arthritis Mother     spinal stenosis  . Cataracts Mother   . Heart disease Mother     pacemaker  . Hypertension Father   . Heart disease Father     pacer/defibrillator   Social History:  reports that she has never smoked. She has never used smokeless tobacco. She reports that  drinks alcohol. She reports that she does not use illicit drugs.  Allergies:  Allergies  Allergen Reactions  . Antihistamines, Diphenhydramine-Type Palpitations   Results for orders placed during the hospital encounter of 02/19/13 (from the past 48 hour(s))  CBC WITH DIFFERENTIAL     Status: None   Collection Time    02/20/13 12:55 AM      Result Value Range   WBC 6.5  4.0 - 10.5 K/uL   RBC 4.04  3.87 - 5.11 MIL/uL   Hemoglobin 12.6  12.0 - 15.0 g/dL   HCT 29.5  62.1 - 30.8 %   MCV 89.9  78.0 - 100.0 fL   MCH 31.2  26.0 - 34.0 pg  MCHC 34.7  30.0 - 36.0 g/dL   RDW 16.1  09.6 - 04.5 %   Platelets 287  150 - 400 K/uL   Neutrophils Relative 74  43 - 77 %   Neutro Abs 4.8  1.7 - 7.7 K/uL   Lymphocytes Relative 18  12 - 46 %   Lymphs Abs 1.2  0.7 - 4.0 K/uL   Monocytes Relative 5  3 - 12 %   Monocytes Absolute 0.3  0.1 - 1.0 K/uL   Eosinophils Relative 2  0 - 5 %   Eosinophils Absolute 0.1  0.0 - 0.7 K/uL   Basophils Relative 1  0 - 1 %   Basophils Absolute 0.1  0.0 - 0.1 K/uL  BASIC METABOLIC PANEL     Status: Abnormal   Collection Time    02/20/13 12:55 AM      Result Value Range   Sodium 136  135 - 145 mEq/L   Potassium 3.8  3.5 - 5.1 mEq/L   Chloride 101  96 - 112 mEq/L   CO2 25  19 - 32 mEq/L   Glucose, Bld 124 (*) 70 - 99 mg/dL   BUN 16  6 - 23 mg/dL   Creatinine, Ser 4.09  0.50 - 1.10 mg/dL   Calcium 9.1  8.4  - 81.1 mg/dL   GFR calc non Af Amer >90  >90 mL/min   GFR calc Af Amer >90  >90 mL/min   Comment:            The eGFR has been calculated     using the CKD EPI equation.     This calculation has not been     validated in all clinical     situations.     eGFR's persistently     <90 mL/min signify     possible Chronic Kidney Disease.  URINALYSIS, ROUTINE W REFLEX MICROSCOPIC     Status: Abnormal   Collection Time    02/20/13  1:40 AM      Result Value Range   Color, Urine YELLOW  YELLOW   APPearance CLOUDY (*) CLEAR   Specific Gravity, Urine 1.020  1.005 - 1.030   pH 7.0  5.0 - 8.0   Glucose, UA NEGATIVE  NEGATIVE mg/dL   Hgb urine dipstick SMALL (*) NEGATIVE   Bilirubin Urine NEGATIVE  NEGATIVE   Ketones, ur NEGATIVE  NEGATIVE mg/dL   Protein, ur NEGATIVE  NEGATIVE mg/dL   Urobilinogen, UA 0.2  0.0 - 1.0 mg/dL   Nitrite NEGATIVE  NEGATIVE   Leukocytes, UA NEGATIVE  NEGATIVE  URINE MICROSCOPIC-ADD ON     Status: None   Collection Time    02/20/13  1:40 AM      Result Value Range   RBC / HPF 3-6  <3 RBC/hpf   Urine-Other AMORPHOUS URATES/PHOSPHATES    MAGNESIUM     Status: None   Collection Time    02/20/13  7:13 AM      Result Value Range   Magnesium 2.1  1.5 - 2.5 mg/dL   Mr Cervical Spine Wo Contrast  02/20/2013  *RADIOLOGY REPORT*  Clinical Data:  Acute onset of paraparesis.  Hyperreflexia concerning for cervical spine lesion.  Associated spasms.  MRI CERVICAL, THORACIC AND LUMBAR SPINE WITHOUT CONTRAST  Technique:  Multiplanar and multiecho pulse sequences of the cervical spine, to include the craniocervical junction and cervicothoracic junction, and thoracic and lumbar spine, were obtained without intravenous contrast.  Comparison:   None.  MRI CERVICAL SPINE  Findings:  Straightening of the cervical lordosis with mild kyphosis.  There is cervical spondylosis.  No cord compression or cord lesion is identified.  C2-3:  Negative  C3-4:  3 mm anterior slip with disc  degeneration and facet degeneration.  No cord compression.  C4-5:  Disc degeneration and spondylosis with mild narrowing of the canal but no significant spinal stenosis.  C5-6:  Disc degeneration and spondylosis with mild narrowing of the anterior subarachnoid space.  No cord deformity.  C6-7:  Disc degeneration and spondylosis narrowing the anterior subarachnoid space.  No cord compression.  Mild foraminal narrowing due to spurring bilaterally  C7-T1:  Negative  IMPRESSION: Cervical spondylosis.  No cord compression or significant spinal stenosis.  These findings appear chronic.  MRI THORACIC SPINE  Findings: Negative for fracture or mass in the thoracic spine. Negative for cord compression.  No focal cord lesion is seen.  The signal in the spinal cord is normal.  No disc protrusion or spinal stenosis is present.  Negative for epidural abscess or evidence of infection.  There is mild disc degeneration at T11-T12  and T12-L1.  IMPRESSION: No acute abnormality in the cervical spine.  Specifically no cord compression or cord lesion is present.  MRI LUMBAR SPINE  Findings: The conus medullaris is normal and terminates at T12-L1.  Negative for fracture or mass in the lumbar spine.  L1-2:  Mild disc bulging without stenosis  L2-3:  Mild posterior slip.  Disc degeneration without stenosis  L3-4:  Disc degeneration with mild spondylosis.  Moderate to advanced facet hypertrophy bilaterally with  bilateral facet joint effusions.  Synovial cyst on the right projects into the canal and could cause impingement of the L4 nerve root on the right in the lateral recess.  The right foramen is mildly narrowed at this level.  Central canal is mildly narrowed without significant spinal stenosis  L4-5:  7 mm anterior slip with moderate disc degeneration and spondylosis.  There is advanced facet degeneration bilaterally with facet hypertrophy and bilateral facet joint effusions.  These findings contribute to foraminal encroachment  bilaterally.  There is impingement of the left L4 nerve root in the foramen.  There has been prior decompressive laminectomy on the left.  L5-S1:  Disc degeneration and mild spondylosis and mild facet degeneration.  IMPRESSION: The conus medullaris is normal.  Lumbar degenerative changes as above.  These appear chronic.  I discussed the findings by telephone with Dr. Roseanne Reno.   Original Report Authenticated By: Janeece Riggers, M.D.    Mr Thoracic Spine Wo Contrast  02/20/2013  *RADIOLOGY REPORT*  Clinical Data:  Acute onset of paraparesis.  Hyperreflexia concerning for cervical spine lesion.  Associated spasms.  MRI CERVICAL, THORACIC AND LUMBAR SPINE WITHOUT CONTRAST  Technique:  Multiplanar and multiecho pulse sequences of the cervical spine, to include the craniocervical junction and cervicothoracic junction, and thoracic and lumbar spine, were obtained without intravenous contrast.  Comparison:   None.  MRI CERVICAL SPINE  Findings:  Straightening of the cervical lordosis with mild kyphosis.  There is cervical spondylosis.  No cord compression or cord lesion is identified.  C2-3:  Negative  C3-4:  3 mm anterior slip with disc degeneration and facet degeneration.  No cord compression.  C4-5:  Disc degeneration and spondylosis with mild narrowing of the canal but no significant spinal stenosis.  C5-6:  Disc degeneration and spondylosis with mild narrowing of the anterior subarachnoid space.  No cord deformity.  C6-7:  Disc  degeneration and spondylosis narrowing the anterior subarachnoid space.  No cord compression.  Mild foraminal narrowing due to spurring bilaterally  C7-T1:  Negative  IMPRESSION: Cervical spondylosis.  No cord compression or significant spinal stenosis.  These findings appear chronic.  MRI THORACIC SPINE  Findings: Negative for fracture or mass in the thoracic spine. Negative for cord compression.  No focal cord lesion is seen.  The signal in the spinal cord is normal.  No disc protrusion or  spinal stenosis is present.  Negative for epidural abscess or evidence of infection.  There is mild disc degeneration at T11-T12  and T12-L1.  IMPRESSION: No acute abnormality in the cervical spine.  Specifically no cord compression or cord lesion is present.  MRI LUMBAR SPINE  Findings: The conus medullaris is normal and terminates at T12-L1.  Negative for fracture or mass in the lumbar spine.  L1-2:  Mild disc bulging without stenosis  L2-3:  Mild posterior slip.  Disc degeneration without stenosis  L3-4:  Disc degeneration with mild spondylosis.  Moderate to advanced facet hypertrophy bilaterally with  bilateral facet joint effusions.  Synovial cyst on the right projects into the canal and could cause impingement of the L4 nerve root on the right in the lateral recess.  The right foramen is mildly narrowed at this level.  Central canal is mildly narrowed without significant spinal stenosis  L4-5:  7 mm anterior slip with moderate disc degeneration and spondylosis.  There is advanced facet degeneration bilaterally with facet hypertrophy and bilateral facet joint effusions.  These findings contribute to foraminal encroachment bilaterally.  There is impingement of the left L4 nerve root in the foramen.  There has been prior decompressive laminectomy on the left.  L5-S1:  Disc degeneration and mild spondylosis and mild facet degeneration.  IMPRESSION: The conus medullaris is normal.  Lumbar degenerative changes as above.  These appear chronic.  I discussed the findings by telephone with Dr. Roseanne Reno.   Original Report Authenticated By: Janeece Riggers, M.D.    Mr Lumbar Spine Wo Contrast  02/20/2013  *RADIOLOGY REPORT*  Clinical Data:  Acute onset of paraparesis.  Hyperreflexia concerning for cervical spine lesion.  Associated spasms.  MRI CERVICAL, THORACIC AND LUMBAR SPINE WITHOUT CONTRAST  Technique:  Multiplanar and multiecho pulse sequences of the cervical spine, to include the craniocervical junction and  cervicothoracic junction, and thoracic and lumbar spine, were obtained without intravenous contrast.  Comparison:   None.  MRI CERVICAL SPINE  Findings:  Straightening of the cervical lordosis with mild kyphosis.  There is cervical spondylosis.  No cord compression or cord lesion is identified.  C2-3:  Negative  C3-4:  3 mm anterior slip with disc degeneration and facet degeneration.  No cord compression.  C4-5:  Disc degeneration and spondylosis with mild narrowing of the canal but no significant spinal stenosis.  C5-6:  Disc degeneration and spondylosis with mild narrowing of the anterior subarachnoid space.  No cord deformity.  C6-7:  Disc degeneration and spondylosis narrowing the anterior subarachnoid space.  No cord compression.  Mild foraminal narrowing due to spurring bilaterally  C7-T1:  Negative  IMPRESSION: Cervical spondylosis.  No cord compression or significant spinal stenosis.  These findings appear chronic.  MRI THORACIC SPINE  Findings: Negative for fracture or mass in the thoracic spine. Negative for cord compression.  No focal cord lesion is seen.  The signal in the spinal cord is normal.  No disc protrusion or spinal stenosis is present.  Negative for epidural abscess  or evidence of infection.  There is mild disc degeneration at T11-T12  and T12-L1.  IMPRESSION: No acute abnormality in the cervical spine.  Specifically no cord compression or cord lesion is present.  MRI LUMBAR SPINE  Findings: The conus medullaris is normal and terminates at T12-L1.  Negative for fracture or mass in the lumbar spine.  L1-2:  Mild disc bulging without stenosis  L2-3:  Mild posterior slip.  Disc degeneration without stenosis  L3-4:  Disc degeneration with mild spondylosis.  Moderate to advanced facet hypertrophy bilaterally with  bilateral facet joint effusions.  Synovial cyst on the right projects into the canal and could cause impingement of the L4 nerve root on the right in the lateral recess.  The right foramen  is mildly narrowed at this level.  Central canal is mildly narrowed without significant spinal stenosis  L4-5:  7 mm anterior slip with moderate disc degeneration and spondylosis.  There is advanced facet degeneration bilaterally with facet hypertrophy and bilateral facet joint effusions.  These findings contribute to foraminal encroachment bilaterally.  There is impingement of the left L4 nerve root in the foramen.  There has been prior decompressive laminectomy on the left.  L5-S1:  Disc degeneration and mild spondylosis and mild facet degeneration.  IMPRESSION: The conus medullaris is normal.  Lumbar degenerative changes as above.  These appear chronic.  I discussed the findings by telephone with Dr. Roseanne Reno.   Original Report Authenticated By: Janeece Riggers, M.D.     ROS Negative ROS except as per HPI and the following  Neuro: intermittent, short duration (3-5 secs) coarse upper and lower extremity tremors  Blood pressure 106/61, pulse 72, temperature 99.5 F (37.5 C), temperature source Oral, resp. rate 16, SpO2 100.00%. Physical Exam  BP 124/89  Pulse 99  Temp(Src) 99.5 F (37.5 C) (Oral)  Resp 16  SpO2 100% General appearance: resting, awake, slowly responsive, following commands and answering questions appropriately.  Head: Normocephalic, without obvious abnormality, atraumatic Eyes: conjunctivae/corneas clear. PERRL, EOM's intact.  Throat: lips, mucosa, and tongue normal; teeth and gums normal Lungs: clear to auscultation bilaterally Heart: sinus tachycardia, no MRG.  Abdomen: soft, non-tender; bowel sounds normal; no masses,  no organomegaly Extremities: extremities normal, atraumatic, no cyanosis or edema Pulses: 2+ and symmetric Skin: Skin color, texture, turgor normal. No rashes or lesions Back: negative straight leg raising bilaterally.  Neurologic:  Mental status: alertness: lethargic, orientation: time, date, person, place, affect: normal, thought content exhibits logical  connections Cranial nerves: normal Sensory: temperature sense present generalized bilaterally Motor: Tone is normal. Bulk is normal.  LUE: 5/5 strength RUE: 4/5 triceps and grip, 4+/5 biceps. LLE: 4-/5 hip flexion, knee flexion/extension and plantar and plantar/dorsiflexion  RLE: 3/5 hip flexion, knee flexion/extension and plantar and plantar/dorsiflexion  Reflexes: quadriceps reflex (L-2 to L-4) right 3+/4, left 2+/4 brachioradialis reflex (C-5 to C-6) right and left 3+/4 Coordination: normal Gait: not tested due to weakness  Assessment/Plan 62 yo F presents with acute onset of bilateral LE weakness and tingling.   1. Bilateral LE weakness: acute onset. Weakness without atrophy and hyperreflexia on  exam concerning for UMN lesion. Patient with C5-C6,  L4-L5 and L5-S1 spondylosis on MRI. No spinal cord lesions, masses or abscesses noted to account for physical exam finding. Patient is s/p one dose of IV Solumerdol 500 mg and she has been evaluated by neurology.  P:  Admit to med-surg tele bed Obtain MRI head to rule out intracranial pathology/lesion consistent with MS.  Continue IV  Solumedrol 500 mg BID.  I spoke to Dr. Roseanne Reno, [facility] who agrees with continuing steroids and obtaining MRI of head. If MRI of head is negative, he will perform a LP to evaluate for inflammatory process. He request that FMTS page him at [phone] to discuss MRI of head. F/u neuro exam.   FEN/GI: Lytes normal. Regular diet.   DVT PPX: lovenox.   Code: Full.   Dispo: pending work-up and stabilization/improvement in neurological findings.    FUNCHES,JOSALYN 02/20/2013, 11:50 AM

## 2013-02-20 NOTE — Consult Note (Signed)
Reason for Consult: LE weakness Referring Physician: Molpus, J  CC: LE weakness  History is obtained from:Patient  HPI: Gail Gilbert is a 62 y.o. female who began having some pins and needles earlier today. Then this afternoon, she noticed numbness of her legs. Subsequently she has noticed that hse has had weakness, and currently is even having difficulty walking. Then, this afternoon, she began having some numbness in the right hand as well. She denies incontinence. She denies previous history of similar events. She denies history of blurred vision.   LKW: this morning tpa given, no out of window.   ROS: A 14 point ROS was performed and is negative except as noted in the HPI.  Past Medical History  Diagnosis Date  . Arthritis     cervical,feet  . Depression   . Migraine   . GERD (gastroesophageal reflux disease) 12/2006    esophageal stricture  . Vitamin d deficiency   . MVP (mitral valve prolapse)   . Spondylolisthesis     L4-5  . RLS (restless legs syndrome)   . Anxiety   . Interstitial cystitis   . IBS (irritable bowel syndrome)   . Insomnia   . DVT (deep vein thrombosis) in pregnancy 1989    postpartum    Family History: No history of autoimmune disease or neuropathy  Social History: Tob: denies  Exam: Current vital signs: BP 123/69  Pulse 81  Temp(Src) 98.7 F (37.1 C) (Oral)  Resp 18  SpO2 100% Vital signs in last 24 hours: Temp:  [98.7 F (37.1 C)] 98.7 F (37.1 C) (03/08 2144) Pulse Rate:  [73-81] 81 (03/09 0134) Resp:  [18] 18 (03/09 0134) BP: (123-125)/(66-69) 123/69 mmHg (03/09 0134) SpO2:  [100 %] 100 % (03/09 0134)  General: in bed, appears anxious CV: RRR Mental Status: Patient is awake, alert, oriented to person, place, month, year, and situation. Immediate and remote memory are intact. Patient is able to give a clear and coherent history. No sign of aphasia.  Cranial Nerves: II: Visual Fields are full. Pupils are < 1mm different, left  > right, round, and reactive to light.  Discs are difficult to visualize. III,IV, VI: EOMI without ptosis or diploplia.  V: Facial sensation is symmetric to temperature VII: Facial movement is symmetric.  VIII: hearing is intact to voice X: Uvula elevates symmetrically XI: Shoulder shrug is symmetric. XII: tongue is midline without atrophy or fasciculations.  Motor: Tone is normal. Bulk is normal. 5/5 strength was present in left arm, right arm 4/5 triceps and grip, 4+/5 biceps. In her legs she had 4-/5 strength in her left hip flexor, 4/5 knee flexor/extensor, and 4+/5 plantar/dorsiflexion. On the right, she has 3/5 hip flexion, 4-/5 knee flexion/extension, and 4/5 plantar/dorsiflexion.  Sensory: Sensation is diminished to vibration and pin in teh legs, right more than left. In hands, sensation to pin and vibration are intact.  Deep Tendon Reflexes: 3+ and symmetric in the biceps and patellae with crossed adductors. She has sustained clonus bilaterally at the ankles.  Plantars: Toes are equivocal bilaterally.  Cerebellar: FNF  intact bilaterally Gait: Not tested due to weakness.   I have reviewed labs in epic and the results pertinent to this consultation are: CBC, BMP unremarkable.   Impression: 62 yo F with rapidly progressive acute paraparesis and hyperreflexia concerning for a C-Spine lesion. At this time, I would be concerned for a compressive lesion and she will need emergent evaluation of her cervical and thoracic spine.   Also  to be considered would be a transverse myelitis.   Recommendations: 1) Solumedrol 500mg  IV x 1 2) MRI C-Spine and T-Spine w/wo contrast.    Ritta Slot, MD [facility] [phone]  If 7pm- 7am, please page neurology on call at [phone].

## 2013-02-20 NOTE — ED Notes (Signed)
CARELINK ARRIVAL

## 2013-02-20 NOTE — ED Notes (Signed)
Pt unable to remain still for MRI, appears to be having intermittent, short-lasting spasms of the BUE and BLE

## 2013-02-21 DIAGNOSIS — G0490 Encephalitis and encephalomyelitis, unspecified: Secondary | ICD-10-CM

## 2013-02-21 DIAGNOSIS — R29898 Other symptoms and signs involving the musculoskeletal system: Secondary | ICD-10-CM

## 2013-02-21 DIAGNOSIS — G0491 Myelitis, unspecified: Secondary | ICD-10-CM | POA: Insufficient documentation

## 2013-02-21 LAB — CSF CELL COUNT WITH DIFFERENTIAL
Eosinophils, CSF: NONE SEEN % (ref 0–1)
Segmented Neutrophils-CSF: NONE SEEN % (ref 0–6)
WBC, CSF: 1 /mm3 (ref 0–5)

## 2013-02-21 LAB — CBC
MCH: 31.4 pg (ref 26.0–34.0)
MCHC: 34.9 g/dL (ref 30.0–36.0)
Platelets: 286 10*3/uL (ref 150–400)

## 2013-02-21 LAB — GRAM STAIN

## 2013-02-21 LAB — PROTIME-INR: Prothrombin Time: 13.7 seconds (ref 11.6–15.2)

## 2013-02-21 MED ORDER — SERTRALINE HCL 50 MG PO TABS
50.0000 mg | ORAL_TABLET | Freq: Every morning | ORAL | Status: DC
  Administered 2013-02-21 – 2013-03-01 (×9): 50 mg via ORAL
  Filled 2013-02-21 (×9): qty 1

## 2013-02-21 MED ORDER — BACLOFEN 5 MG HALF TABLET
5.0000 mg | ORAL_TABLET | Freq: Four times a day (QID) | ORAL | Status: DC
  Administered 2013-02-21 – 2013-03-01 (×30): 5 mg via ORAL
  Filled 2013-02-21 (×35): qty 1

## 2013-02-21 MED ORDER — POVIDONE-IODINE 10 % EX SOLN
CUTANEOUS | Status: DC | PRN
  Filled 2013-02-21: qty 118

## 2013-02-21 NOTE — Progress Notes (Signed)
[facility] Attending Note Patient seen and examined by me; she is laying comfortably in bed, having just had LP performed prior to my visit.  Denies pain, says the dexterity of her hands is improved today from yesterday but still bothered by leg spasms and is concerned about leg weakness.  I did not get patient up to ambulate, as she has just had LP performed.  Plan to continue high-dose steroids, PT/OT, await results of LP.  Appreciate Neurohospitalist consult. Paula Compton, MD

## 2013-02-21 NOTE — Progress Notes (Addendum)
Subjective: No changes noted in strength with weakness and numbness distally in upper extremities and both lower extremities. She's experiencing spasms involving her legs which are uncomfortable. She's had no problems with bowel or bladder control.  Objective: Current vital signs: BP 112/63  Pulse 88  Temp(Src) 97.5 F (36.4 C) (Axillary)  Resp 18  SpO2 97%  Neurologic Exam: Alert and in no acute distress. Mental status was normal. Extraocular movements were full and conjugate. No facial weakness. Normal speech with no dysarthria Normal strength of upper extremities proximally; slight weakness of wrist extensors, right greater than left; mild intrinsic hand muscle weakness on the left and moderate on the right. Marked weakness of hip flexors with 2/5 strength bilaterally; quadriceps were normal; tibialis anterior and gastrocnemius muscles were normal. Diffuse hyperreflexia with 3+ reflexes in the upper extremities and at the knees; sustained clonus at right ankle; sustained clonus at left ankle.  Lab Results: Results for orders placed during the hospital encounter of 02/19/13 (from the past 48 hour(s))  CBC WITH DIFFERENTIAL     Status: None   Collection Time    02/20/13 12:55 AM      Result Value Range   WBC 6.5  4.0 - 10.5 K/uL   RBC 4.04  3.87 - 5.11 MIL/uL   Hemoglobin 12.6  12.0 - 15.0 g/dL   HCT 16.1  09.6 - 04.5 %   MCV 89.9  78.0 - 100.0 fL   MCH 31.2  26.0 - 34.0 pg   MCHC 34.7  30.0 - 36.0 g/dL   RDW 40.9  81.1 - 91.4 %   Platelets 287  150 - 400 K/uL   Neutrophils Relative 74  43 - 77 %   Neutro Abs 4.8  1.7 - 7.7 K/uL   Lymphocytes Relative 18  12 - 46 %   Lymphs Abs 1.2  0.7 - 4.0 K/uL   Monocytes Relative 5  3 - 12 %   Monocytes Absolute 0.3  0.1 - 1.0 K/uL   Eosinophils Relative 2  0 - 5 %   Eosinophils Absolute 0.1  0.0 - 0.7 K/uL   Basophils Relative 1  0 - 1 %   Basophils Absolute 0.1  0.0 - 0.1 K/uL  BASIC METABOLIC PANEL     Status: Abnormal    Collection Time    02/20/13 12:55 AM      Result Value Range   Sodium 136  135 - 145 mEq/L   Potassium 3.8  3.5 - 5.1 mEq/L   Chloride 101  96 - 112 mEq/L   CO2 25  19 - 32 mEq/L   Glucose, Bld 124 (*) 70 - 99 mg/dL   BUN 16  6 - 23 mg/dL   Creatinine, Ser 7.82  0.50 - 1.10 mg/dL   Calcium 9.1  8.4 - 95.6 mg/dL   GFR calc non Af Amer >90  >90 mL/min   GFR calc Af Amer >90  >90 mL/min   Comment:            The eGFR has been calculated     using the CKD EPI equation.     This calculation has not been     validated in all clinical     situations.     eGFR's persistently     <90 mL/min signify     possible Chronic Kidney Disease.  URINALYSIS, ROUTINE W REFLEX MICROSCOPIC     Status: Abnormal   Collection Time    02/20/13  1:40  AM      Result Value Range   Color, Urine YELLOW  YELLOW   APPearance CLOUDY (*) CLEAR   Specific Gravity, Urine 1.020  1.005 - 1.030   pH 7.0  5.0 - 8.0   Glucose, UA NEGATIVE  NEGATIVE mg/dL   Hgb urine dipstick SMALL (*) NEGATIVE   Bilirubin Urine NEGATIVE  NEGATIVE   Ketones, ur NEGATIVE  NEGATIVE mg/dL   Protein, ur NEGATIVE  NEGATIVE mg/dL   Urobilinogen, UA 0.2  0.0 - 1.0 mg/dL   Nitrite NEGATIVE  NEGATIVE   Leukocytes, UA NEGATIVE  NEGATIVE  URINE MICROSCOPIC-ADD ON     Status: None   Collection Time    02/20/13  1:40 AM      Result Value Range   RBC / HPF 3-6  <3 RBC/hpf   Urine-Other AMORPHOUS URATES/PHOSPHATES    MAGNESIUM     Status: None   Collection Time    02/20/13  7:13 AM      Result Value Range   Magnesium 2.1  1.5 - 2.5 mg/dL  TSH     Status: None   Collection Time    02/20/13  7:13 AM      Result Value Range   TSH 1.420  0.350 - 4.500 uIU/mL  T4, FREE     Status: None   Collection Time    02/20/13  7:13 AM      Result Value Range   Free T4 0.94  0.80 - 1.80 ng/dL  CBC     Status: None   Collection Time    02/20/13  1:00 PM      Result Value Range   WBC 8.2  4.0 - 10.5 K/uL   RBC 4.28  3.87 - 5.11 MIL/uL    Hemoglobin 13.5  12.0 - 15.0 g/dL   HCT 16.1  09.6 - 04.5 %   MCV 89.7  78.0 - 100.0 fL   MCH 31.5  26.0 - 34.0 pg   MCHC 35.2  30.0 - 36.0 g/dL   RDW 40.9  81.1 - 91.4 %   Platelets 300  150 - 400 K/uL  CREATININE, SERUM     Status: None   Collection Time    02/20/13  1:00 PM      Result Value Range   Creatinine, Ser 0.52  0.50 - 1.10 mg/dL   GFR calc non Af Amer >90  >90 mL/min   GFR calc Af Amer >90  >90 mL/min   Comment:            The eGFR has been calculated     using the CKD EPI equation.     This calculation has not been     validated in all clinical     situations.     eGFR's persistently     <90 mL/min signify     possible Chronic Kidney Disease.  CBC     Status: Abnormal   Collection Time    02/21/13  1:14 PM      Result Value Range   WBC 13.1 (*) 4.0 - 10.5 K/uL   RBC 4.07  3.87 - 5.11 MIL/uL   Hemoglobin 12.8  12.0 - 15.0 g/dL   HCT 78.2  95.6 - 21.3 %   MCV 90.2  78.0 - 100.0 fL   MCH 31.4  26.0 - 34.0 pg   MCHC 34.9  30.0 - 36.0 g/dL   RDW 08.6  57.8 - 46.9 %   Platelets 286  150 - 400 K/uL  PROTIME-INR     Status: None   Collection Time    02/21/13  1:14 PM      Result Value Range   Prothrombin Time 13.7  11.6 - 15.2 seconds   INR 1.06  0.00 - 1.49    Studies/Results: Mr Laqueta Jean AV Contrast  02/20/2013  *RADIOLOGY REPORT*  Clinical Data: Bilateral leg weakness.  Rule out demyelinating disease.  MRI HEAD WITHOUT AND WITH CONTRAST  Technique:  Multiplanar, multiecho pulse sequences of the brain and surrounding structures were obtained according to standard protocol without and with intravenous contrast  Contrast: 13mL MULTIHANCE GADOBENATE DIMEGLUMINE 529 MG/ML IV SOLN  Comparison: None.  Findings: Ventricle size is normal.  Craniocervical junction is normal.  Pituitary is not enlarged.  Scattered small subcortical white matter hyperintensities are present on the right.  Left cerebral white matter is normal.  Basal ganglion brainstem and cerebellum are  normal.  Diffusion weighted imaging is negative.  No acute infarct. Negative for hemorrhage or mass lesion.  No pathologic enhancement following contrast administration.  Chronic sinusitis with mucosal thickening in the paranasal sinuses.  IMPRESSION: Scattered small subcortical white matter hyperintensities in the cerebral white matter on the right.  These are most likely related to chronic microvascular ischemia.  Differential includes migraine headache and vasculitis.  No acute infarct or mass.  Multiple sclerosis not felt likely.   Original Report Authenticated By: Janeece Riggers, M.D.    Mr Cervical Spine Wo Contrast  02/20/2013  *RADIOLOGY REPORT*  Clinical Data:  Acute onset of paraparesis.  Hyperreflexia concerning for cervical spine lesion.  Associated spasms.  MRI CERVICAL, THORACIC AND LUMBAR SPINE WITHOUT CONTRAST  Technique:  Multiplanar and multiecho pulse sequences of the cervical spine, to include the craniocervical junction and cervicothoracic junction, and thoracic and lumbar spine, were obtained without intravenous contrast.  Comparison:   None.  MRI CERVICAL SPINE  Findings:  Straightening of the cervical lordosis with mild kyphosis.  There is cervical spondylosis.  No cord compression or cord lesion is identified.  C2-3:  Negative  C3-4:  3 mm anterior slip with disc degeneration and facet degeneration.  No cord compression.  C4-5:  Disc degeneration and spondylosis with mild narrowing of the canal but no significant spinal stenosis.  C5-6:  Disc degeneration and spondylosis with mild narrowing of the anterior subarachnoid space.  No cord deformity.  C6-7:  Disc degeneration and spondylosis narrowing the anterior subarachnoid space.  No cord compression.  Mild foraminal narrowing due to spurring bilaterally  C7-T1:  Negative  IMPRESSION: Cervical spondylosis.  No cord compression or significant spinal stenosis.  These findings appear chronic.  MRI THORACIC SPINE  Findings: Negative for fracture  or mass in the thoracic spine. Negative for cord compression.  No focal cord lesion is seen.  The signal in the spinal cord is normal.  No disc protrusion or spinal stenosis is present.  Negative for epidural abscess or evidence of infection.  There is mild disc degeneration at T11-T12  and T12-L1.  IMPRESSION: No acute abnormality in the cervical spine.  Specifically no cord compression or cord lesion is present.  MRI LUMBAR SPINE  Findings: The conus medullaris is normal and terminates at T12-L1.  Negative for fracture or mass in the lumbar spine.  L1-2:  Mild disc bulging without stenosis  L2-3:  Mild posterior slip.  Disc degeneration without stenosis  L3-4:  Disc degeneration with mild spondylosis.  Moderate to advanced facet hypertrophy bilaterally with  bilateral facet joint effusions.  Synovial cyst on the right projects into the canal and could cause impingement of the L4 nerve root on the right in the lateral recess.  The right foramen is mildly narrowed at this level.  Central canal is mildly narrowed without significant spinal stenosis  L4-5:  7 mm anterior slip with moderate disc degeneration and spondylosis.  There is advanced facet degeneration bilaterally with facet hypertrophy and bilateral facet joint effusions.  These findings contribute to foraminal encroachment bilaterally.  There is impingement of the left L4 nerve root in the foramen.  There has been prior decompressive laminectomy on the left.  L5-S1:  Disc degeneration and mild spondylosis and mild facet degeneration.  IMPRESSION: The conus medullaris is normal.  Lumbar degenerative changes as above.  These appear chronic.  I discussed the findings by telephone with Dr. Roseanne Reno.   Original Report Authenticated By: Janeece Riggers, M.D.    Mr Thoracic Spine Wo Contrast  02/20/2013  *RADIOLOGY REPORT*  Clinical Data:  Acute onset of paraparesis.  Hyperreflexia concerning for cervical spine lesion.  Associated spasms.  MRI CERVICAL, THORACIC AND  LUMBAR SPINE WITHOUT CONTRAST  Technique:  Multiplanar and multiecho pulse sequences of the cervical spine, to include the craniocervical junction and cervicothoracic junction, and thoracic and lumbar spine, were obtained without intravenous contrast.  Comparison:   None.  MRI CERVICAL SPINE  Findings:  Straightening of the cervical lordosis with mild kyphosis.  There is cervical spondylosis.  No cord compression or cord lesion is identified.  C2-3:  Negative  C3-4:  3 mm anterior slip with disc degeneration and facet degeneration.  No cord compression.  C4-5:  Disc degeneration and spondylosis with mild narrowing of the canal but no significant spinal stenosis.  C5-6:  Disc degeneration and spondylosis with mild narrowing of the anterior subarachnoid space.  No cord deformity.  C6-7:  Disc degeneration and spondylosis narrowing the anterior subarachnoid space.  No cord compression.  Mild foraminal narrowing due to spurring bilaterally  C7-T1:  Negative  IMPRESSION: Cervical spondylosis.  No cord compression or significant spinal stenosis.  These findings appear chronic.  MRI THORACIC SPINE  Findings: Negative for fracture or mass in the thoracic spine. Negative for cord compression.  No focal cord lesion is seen.  The signal in the spinal cord is normal.  No disc protrusion or spinal stenosis is present.  Negative for epidural abscess or evidence of infection.  There is mild disc degeneration at T11-T12  and T12-L1.  IMPRESSION: No acute abnormality in the cervical spine.  Specifically no cord compression or cord lesion is present.  MRI LUMBAR SPINE  Findings: The conus medullaris is normal and terminates at T12-L1.  Negative for fracture or mass in the lumbar spine.  L1-2:  Mild disc bulging without stenosis  L2-3:  Mild posterior slip.  Disc degeneration without stenosis  L3-4:  Disc degeneration with mild spondylosis.  Moderate to advanced facet hypertrophy bilaterally with  bilateral facet joint effusions.   Synovial cyst on the right projects into the canal and could cause impingement of the L4 nerve root on the right in the lateral recess.  The right foramen is mildly narrowed at this level.  Central canal is mildly narrowed without significant spinal stenosis  L4-5:  7 mm anterior slip with moderate disc degeneration and spondylosis.  There is advanced facet degeneration bilaterally with facet hypertrophy and bilateral facet joint effusions.  These findings contribute to foraminal encroachment bilaterally.  There is impingement  of the left L4 nerve root in the foramen.  There has been prior decompressive laminectomy on the left.  L5-S1:  Disc degeneration and mild spondylosis and mild facet degeneration.  IMPRESSION: The conus medullaris is normal.  Lumbar degenerative changes as above.  These appear chronic.  I discussed the findings by telephone with Dr. Roseanne Reno.   Original Report Authenticated By: Janeece Riggers, M.D.    Mr Lumbar Spine Wo Contrast  02/20/2013  *RADIOLOGY REPORT*  Clinical Data:  Acute onset of paraparesis.  Hyperreflexia concerning for cervical spine lesion.  Associated spasms.  MRI CERVICAL, THORACIC AND LUMBAR SPINE WITHOUT CONTRAST  Technique:  Multiplanar and multiecho pulse sequences of the cervical spine, to include the craniocervical junction and cervicothoracic junction, and thoracic and lumbar spine, were obtained without intravenous contrast.  Comparison:   None.  MRI CERVICAL SPINE  Findings:  Straightening of the cervical lordosis with mild kyphosis.  There is cervical spondylosis.  No cord compression or cord lesion is identified.  C2-3:  Negative  C3-4:  3 mm anterior slip with disc degeneration and facet degeneration.  No cord compression.  C4-5:  Disc degeneration and spondylosis with mild narrowing of the canal but no significant spinal stenosis.  C5-6:  Disc degeneration and spondylosis with mild narrowing of the anterior subarachnoid space.  No cord deformity.  C6-7:  Disc  degeneration and spondylosis narrowing the anterior subarachnoid space.  No cord compression.  Mild foraminal narrowing due to spurring bilaterally  C7-T1:  Negative  IMPRESSION: Cervical spondylosis.  No cord compression or significant spinal stenosis.  These findings appear chronic.  MRI THORACIC SPINE  Findings: Negative for fracture or mass in the thoracic spine. Negative for cord compression.  No focal cord lesion is seen.  The signal in the spinal cord is normal.  No disc protrusion or spinal stenosis is present.  Negative for epidural abscess or evidence of infection.  There is mild disc degeneration at T11-T12  and T12-L1.  IMPRESSION: No acute abnormality in the cervical spine.  Specifically no cord compression or cord lesion is present.  MRI LUMBAR SPINE  Findings: The conus medullaris is normal and terminates at T12-L1.  Negative for fracture or mass in the lumbar spine.  L1-2:  Mild disc bulging without stenosis  L2-3:  Mild posterior slip.  Disc degeneration without stenosis  L3-4:  Disc degeneration with mild spondylosis.  Moderate to advanced facet hypertrophy bilaterally with  bilateral facet joint effusions.  Synovial cyst on the right projects into the canal and could cause impingement of the L4 nerve root on the right in the lateral recess.  The right foramen is mildly narrowed at this level.  Central canal is mildly narrowed without significant spinal stenosis  L4-5:  7 mm anterior slip with moderate disc degeneration and spondylosis.  There is advanced facet degeneration bilaterally with facet hypertrophy and bilateral facet joint effusions.  These findings contribute to foraminal encroachment bilaterally.  There is impingement of the left L4 nerve root in the foramen.  There has been prior decompressive laminectomy on the left.  L5-S1:  Disc degeneration and mild spondylosis and mild facet degeneration.  IMPRESSION: The conus medullaris is normal.  Lumbar degenerative changes as above.  These  appear chronic.  I discussed the findings by telephone with Dr. Roseanne Reno.   Original Report Authenticated By: Janeece Riggers, M.D.     Medications:  I have reviewed the patient's current medications. Scheduled: . baclofen  5 mg Oral QID  .  cholecalciferol  1,000 Units Oral q morning - 10a  . meloxicam  15 mg Oral Daily  . methylPREDNISolone (SOLU-MEDROL) injection  500 mg Intravenous Once  . methylPREDNISolone (SOLU-MEDROL) injection  500 mg Intravenous Q12H  . sertraline  50 mg Oral q morning - 10a  . sodium chloride  3 mL Intravenous Q12H   ZOX:[id number], zolpidem  Assessment/Plan: Probable acute transverse myelitis as etiology for her distal upper extremity and bilateral lower extremity weakness and numbness. No evidence of compressive lesion involving spinal cord is demonstrated. MRI of the brain was unremarkable with no indication of an acute intracranial lesion. Patient is possibly slightly better with increased strength of her quadriceps and distal lower extremity muscles.  Lumbar puncture was performed. Laboratory results pending.  Recommend continuing IV Solu-Medrol 100 mg every 12 hours. Baclofen 5 mg ordered for spasticity 4 times a day. Physical therapy and occupational therapy intervention to continue as well. We will continue to follow this patient closely with you.  C.R. Roseanne Reno, MD [facility]  [phone]  02/21/2013  3:48 PM

## 2013-02-21 NOTE — Discharge Summary (Signed)
Physician Discharge Summary  Patient ID: Gail Gilbert MRN: [id number] DOB: December 24, 1950 Age: 62 y.o.  Admit date: 02/19/2013 Discharge date: 03/01/2013 Admitting Physician: Sanjuana Letters, MD  PCP: No primary provider on file.  Consultants: [facility]    Discharge Diagnosis: Principal Problem:   Idiopathic transverse myelitis Active Problems:   Spondylolisthesis   RLS (restless legs syndrome)   Anxiety   Leg weakness, bilateral   Urinary retention    Hospital Course 62 yo healthy female presented with acute onset of bilateral LE weakness and tingling and RUE weakness and tingling as well as bladder incontinence concerning for spinal compression or UMN lesion. Initial spine MRI did not show compression and head MRI did not show acute changes or pathology consistent with MS.  Under consult with Neurology, patient started on IV steroids with concern for transverse myelitis and LP ordered to look for inflammatory changes. LP results were negative, though clinical presentation consistent with transverse myelitis.  Symptoms remained stable to slightly improved on 4 day course of steroids.  Additional workup initiated including metabolic, infectious, and autoimmune causes for presentation all negative at time of discharge to inpatient rehab.  Vitamin D, GM1 and anti GAD antibodies remain pending. MRI rescan of cervical and lumbar spine with contrast showed inflammatory changes in both regions that could be explained by  infectious process or acute injury on chronic degeneration. No other markers of infection present with evidence of clinical improvement with physical therapy. Second attempt at voiding trial successful with minor dribbling noted by patient. Infrequent bowel movements treated with miralax and metamucil. Baclofen given for spasticity with improvement in symptoms. Patients home medications for depression, anxiety, and osteoarthritis continued. Patient considered good  candidate for inpatient rehabilitation by CIR.     Problem List 1. Bilateral LE weakness and loss of sensation 2. LE spasticity 3. UE parasthesia, worse on right than left 4. Urinary retention        Discharge PE   Filed Vitals:   03/01/13 0540  BP: 102/53  Pulse: 72  Temp: 98.4 F (36.9 C)  Resp: 18   Gen: Well appearing, NAD.  CV: Regular rate and rhythm, no murmurs rubs or gallops  PULM: clear to auscultation bilaterally. No wheezes/rales/rhonchi  ABD: soft/nontender/nondistended  EXT: No edema  Neuro: Alert and oriented x3. LE strength 5/5 bilateral foot dorsi/plantar flexion, 5/5 bilateral hip extension/flexion. Bilateral ankle clonus, upgoing babinskis.  UE right 5/5 grip strength, 5/5 left. Sensation diminished but present bilateral lower extremities and RUE ulnar distribution.     Procedures/Imaging:  Mr Laqueta Jean Wo Contrast 02/20/2013  IMPRESSION: Scattered small subcortical white matter hyperintensities in the cerebral white matter on the right.  These are most likely related to chronic microvascular ischemia.  Differential includes migraine headache and vasculitis.  No acute infarct or mass.  Multiple sclerosis not felt likely.    Mr Cervical Spine Wo Contrast 02/20/2013  IMPRESSION: No acute abnormality in the cervical spine.  Specifically no cord compression or cord lesion is present.   MRI LUMBAR SPINE  IMPRESSION: The conus medullaris is normal.  Lumbar degenerative changes as above.  These appear chronic.  I discussed the findings by telephone with Dr. Roseanne Reno.    Mr Thoracic Spine Wo Contrast 02/20/2013   IMPRESSION: Cervical spondylosis.  No cord compression or significant spinal stenosis.  These findings appear chronic.   MRI THORACIC SPINE   IMPRESSION: No acute abnormality in the cervical spine.  Specifically no cord compression or cord lesion is  present.   MRI LUMBAR SPINE  IMPRESSION: The conus medullaris is normal.  Lumbar degenerative changes as above.  These  appear chronic.   Labs  CBC   02/22/2013 06:10  WBC 13.0 (H)  RBC 4.02  Hemoglobin 12.5  HCT 35.8 (L)  MCV 89.1  MCH 31.1  MCHC 34.9  RDW 13.4  Platelets 271   BMET  Recent Labs Lab 02/25/13 1733  CREATININE 0.61     Patient condition at time of discharge/disposition: stable  Disposition-Inpatient Rehab   Follow up issues: 1. Bilateral LE weakness and parathesias  2. UE parathesia 3. Urinary Retention 4. Lab results  Discharge follow up:  Follow-up Information   Follow up with [facility] or [facility]. (Call for appointment after discharge for follow up)       Follow up with REYNOLDS,LESLIE D, MD. (Call for appointment after discharge)    Contact information:   [address] [facility] [place] [address]          Discharge Orders   Future Orders Complete By Expires     Diet general  As directed     Increase activity slowly  As directed       Outpatient: follow-up with Dr. Thana Farr, [facility]   Discharge Instructions: Please refer to Patient Instructions section of EMR for full details.  Patient was counseled important signs and symptoms that should prompt return to medical care, changes in medications, dietary instructions, activity restrictions, and follow up appointments.  Significant instructions noted below:  Discharge Medications   Medication List    TAKE these medications       baclofen 5 mg Tabs  Commonly known as:  LIORESAL  Take 0.5 tablets (5 mg total) by mouth 4 (four) times daily.     cholecalciferol 1000 UNITS tablet  Commonly known as:  VITAMIN D  Take 1,000 Units by mouth every morning.     fish oil-omega-3 fatty acids 1000 MG capsule  Take 2 g by mouth every morning.     Melatonin 5 MG Caps  Take 1 capsule by mouth at bedtime as needed (sleep).     meloxicam 15 MG tablet  Commonly known as:  MOBIC  Take 15 mg by mouth every morning.     multivitamin  capsule  Take 1 capsule by mouth daily.     polyethylene glycol packet  Commonly known as:  MIRALAX / GLYCOLAX  Take 17 g by mouth daily as needed.     sertraline 100 MG tablet  Commonly known as:  ZOLOFT  Take 50 mg by mouth every morning.       Ellin Goodie, [facility] 03/01/2013 11:37   I have seen and examined patient and agree with discharge summary above. I have made appropriate changes to reflect my findings. Patient will be discharged to [facility] for intense therapy.  Camella Seim M. Oluwatosin Higginson, M.D. 03/01/2013 11:42 AM

## 2013-02-21 NOTE — Progress Notes (Signed)
Patient ID: Gail Gilbert, female   DOB: 01/07/1951, 61 y.o.   MRN: [id number] PGY-1 Daily Progress Note Family Medicine Teaching Service [name] Service Pager: [phone]   Subjective: No changes reported overnight.  Right hand still feels "clumsy" with tingling along the ulnar distribution.  She reports dropping her drink and not being able to use her smart phone as well as normal.  Feet feel heavy but no changes since yesterday in weakness.  Appetite good, no SOB, visual changes, h/a.   Objective:  VITALS Temp:  [98.1 F (36.7 C)-99.8 F (37.7 C)] 98.1 F (36.7 C) (03/10 0534) Pulse Rate:  [88-107] 88 (03/10 0534) Resp:  [16] 16 (03/10 0534) BP: (104-124)/(65-89) 107/67 mmHg (03/10 0534) SpO2:  [94 %-100 %] 98 % (03/10 0534)  In/Out  Intake/Output Summary (Last 24 hours) at 02/21/13 0923 Last data filed at 02/21/13 0600  Gross per 24 hour  Intake    830 ml  Output   2150 ml  Net  -1320 ml    Physical Exam: Gen:  NAD, laying in bed.  HEENT: moist mucous membranes CV: Regular rate and rhythm, no murmurs rubs or gallops PULM: clear to auscultation bilaterally. No wheezes/rales/rhonchi ABD: soft/nontender/nondistended/normal bowel sounds EXT: No edema Neuro: Alert and oriented x3. LE strength 4/5 bilaterally, unable to stand. UE right 4 - /5 grip strength, 5/5 left.  Sensation intact to temp/light touch bilaterally U/L E. Unable to assess gait.   MEDS Scheduled Meds: . cholecalciferol  1,000 Units Oral q morning - 10a  . meloxicam  15 mg Oral Daily  . methylPREDNISolone (SOLU-MEDROL) injection  500 mg Intravenous Once  . methylPREDNISolone (SOLU-MEDROL) injection  500 mg Intravenous Q12H  . sertraline  50 mg Oral q morning - 10a  . sodium chloride  3 mL Intravenous Q12H   Continuous Infusions:  PRN Meds:.zolpidem  Labs and imaging:   CBC  Recent Labs Lab 02/20/13 0055 02/20/13 1300  WBC 6.5 8.2  HGB 12.6 13.5  HCT 36.3 38.4  PLT 287 300    BMET/CMET  Recent Labs Lab 02/20/13 0055 02/20/13 1300  NA 136  --   K 3.8  --   CL 101  --   CO2 25  --   BUN 16  --   CREATININE 0.58 0.52  CALCIUM 9.1  --   GLUCOSE 124*  --    Results for orders placed during the hospital encounter of 02/19/13 (from the past 24 hour(s))  CBC     Status: None   Collection Time    02/20/13  1:00 PM      Result Value Range   WBC 8.2  4.0 - 10.5 K/uL   RBC 4.28  3.87 - 5.11 MIL/uL   Hemoglobin 13.5  12.0 - 15.0 g/dL   HCT 10.2  72.5 - 36.6 %   MCV 89.7  78.0 - 100.0 fL   MCH 31.5  26.0 - 34.0 pg   MCHC 35.2  30.0 - 36.0 g/dL   RDW 44.0  34.7 - 42.5 %   Platelets 300  150 - 400 K/uL  CREATININE, SERUM     Status: None   Collection Time    02/20/13  1:00 PM      Result Value Range   Creatinine, Ser 0.52  0.50 - 1.10 mg/dL   GFR calc non Af Amer >90  >90 mL/min   GFR calc Af Amer >90  >90 mL/min   Mr Brain W Wo Contrast  02/20/2013  *RADIOLOGY REPORT*  Clinical Data: Bilateral leg weakness.  Rule out demyelinating disease.  MRI HEAD WITHOUT AND WITH CONTRAST  Technique:  Multiplanar, multiecho pulse sequences of the brain and surrounding structures were obtained according to standard protocol without and with intravenous contrast  Contrast: 13mL MULTIHANCE GADOBENATE DIMEGLUMINE 529 MG/ML IV SOLN  Comparison: None.  Findings: Ventricle size is normal.  Craniocervical junction is normal.  Pituitary is not enlarged.  Scattered small subcortical white matter hyperintensities are present on the right.  Left cerebral white matter is normal.  Basal ganglion brainstem and cerebellum are normal.  Diffusion weighted imaging is negative.  No acute infarct. Negative for hemorrhage or mass lesion.  No pathologic enhancement following contrast administration.  Chronic sinusitis with mucosal thickening in the paranasal sinuses.  IMPRESSION: Scattered small subcortical white matter hyperintensities in the cerebral white matter on the right.  These are most  likely related to chronic microvascular ischemia.  Differential includes migraine headache and vasculitis.  No acute infarct or mass.  Multiple sclerosis not felt likely.   Original Report Authenticated By: Janeece Riggers, M.D.    Mr Cervical Spine Wo Contrast  02/20/2013  *RADIOLOGY REPORT*  Clinical Data:  Acute onset of paraparesis.  Hyperreflexia concerning for cervical spine lesion.  Associated spasms.  MRI CERVICAL, THORACIC AND LUMBAR SPINE WITHOUT CONTRAST  Technique:  Multiplanar and multiecho pulse sequences of the cervical spine, to include the craniocervical junction and cervicothoracic junction, and thoracic and lumbar spine, were obtained without intravenous contrast.  Comparison:   None.  MRI CERVICAL SPINE  Findings:  Straightening of the cervical lordosis with mild kyphosis.  There is cervical spondylosis.  No cord compression or cord lesion is identified.  C2-3:  Negative  C3-4:  3 mm anterior slip with disc degeneration and facet degeneration.  No cord compression.  C4-5:  Disc degeneration and spondylosis with mild narrowing of the canal but no significant spinal stenosis.  C5-6:  Disc degeneration and spondylosis with mild narrowing of the anterior subarachnoid space.  No cord deformity.  C6-7:  Disc degeneration and spondylosis narrowing the anterior subarachnoid space.  No cord compression.  Mild foraminal narrowing due to spurring bilaterally  C7-T1:  Negative  IMPRESSION: Cervical spondylosis.  No cord compression or significant spinal stenosis.  These findings appear chronic.  MRI THORACIC SPINE  Findings: Negative for fracture or mass in the thoracic spine. Negative for cord compression.  No focal cord lesion is seen.  The signal in the spinal cord is normal.  No disc protrusion or spinal stenosis is present.  Negative for epidural abscess or evidence of infection.  There is mild disc degeneration at T11-T12  and T12-L1.  IMPRESSION: No acute abnormality in the cervical spine.  Specifically  no cord compression or cord lesion is present.  MRI LUMBAR SPINE  Findings: The conus medullaris is normal and terminates at T12-L1.  Negative for fracture or mass in the lumbar spine.  L1-2:  Mild disc bulging without stenosis  L2-3:  Mild posterior slip.  Disc degeneration without stenosis  L3-4:  Disc degeneration with mild spondylosis.  Moderate to advanced facet hypertrophy bilaterally with  bilateral facet joint effusions.  Synovial cyst on the right projects into the canal and could cause impingement of the L4 nerve root on the right in the lateral recess.  The right foramen is mildly narrowed at this level.  Central canal is mildly narrowed without significant spinal stenosis  L4-5:  7 mm anterior slip with  moderate disc degeneration and spondylosis.  There is advanced facet degeneration bilaterally with facet hypertrophy and bilateral facet joint effusions.  These findings contribute to foraminal encroachment bilaterally.  There is impingement of the left L4 nerve root in the foramen.  There has been prior decompressive laminectomy on the left.  L5-S1:  Disc degeneration and mild spondylosis and mild facet degeneration.  IMPRESSION: The conus medullaris is normal.  Lumbar degenerative changes as above.  These appear chronic.  I discussed the findings by telephone with Dr. Roseanne Reno.   Original Report Authenticated By: Janeece Riggers, M.D.    Mr Thoracic Spine Wo Contrast  02/20/2013  *RADIOLOGY REPORT*  Clinical Data:  Acute onset of paraparesis.  Hyperreflexia concerning for cervical spine lesion.  Associated spasms.  MRI CERVICAL, THORACIC AND LUMBAR SPINE WITHOUT CONTRAST  Technique:  Multiplanar and multiecho pulse sequences of the cervical spine, to include the craniocervical junction and cervicothoracic junction, and thoracic and lumbar spine, were obtained without intravenous contrast.  Comparison:   None.  MRI CERVICAL SPINE  Findings:  Straightening of the cervical lordosis with mild kyphosis.  There  is cervical spondylosis.  No cord compression or cord lesion is identified.  C2-3:  Negative  C3-4:  3 mm anterior slip with disc degeneration and facet degeneration.  No cord compression.  C4-5:  Disc degeneration and spondylosis with mild narrowing of the canal but no significant spinal stenosis.  C5-6:  Disc degeneration and spondylosis with mild narrowing of the anterior subarachnoid space.  No cord deformity.  C6-7:  Disc degeneration and spondylosis narrowing the anterior subarachnoid space.  No cord compression.  Mild foraminal narrowing due to spurring bilaterally  C7-T1:  Negative  IMPRESSION: Cervical spondylosis.  No cord compression or significant spinal stenosis.  These findings appear chronic.  MRI THORACIC SPINE  Findings: Negative for fracture or mass in the thoracic spine. Negative for cord compression.  No focal cord lesion is seen.  The signal in the spinal cord is normal.  No disc protrusion or spinal stenosis is present.  Negative for epidural abscess or evidence of infection.  There is mild disc degeneration at T11-T12  and T12-L1.  IMPRESSION: No acute abnormality in the cervical spine.  Specifically no cord compression or cord lesion is present.  MRI LUMBAR SPINE  Findings: The conus medullaris is normal and terminates at T12-L1.  Negative for fracture or mass in the lumbar spine.  L1-2:  Mild disc bulging without stenosis  L2-3:  Mild posterior slip.  Disc degeneration without stenosis  L3-4:  Disc degeneration with mild spondylosis.  Moderate to advanced facet hypertrophy bilaterally with  bilateral facet joint effusions.  Synovial cyst on the right projects into the canal and could cause impingement of the L4 nerve root on the right in the lateral recess.  The right foramen is mildly narrowed at this level.  Central canal is mildly narrowed without significant spinal stenosis  L4-5:  7 mm anterior slip with moderate disc degeneration and spondylosis.  There is advanced facet degeneration  bilaterally with facet hypertrophy and bilateral facet joint effusions.  These findings contribute to foraminal encroachment bilaterally.  There is impingement of the left L4 nerve root in the foramen.  There has been prior decompressive laminectomy on the left.  L5-S1:  Disc degeneration and mild spondylosis and mild facet degeneration.  IMPRESSION: The conus medullaris is normal.  Lumbar degenerative changes as above.  These appear chronic.  I discussed the findings by telephone with Dr. Roseanne Reno.  Original Report Authenticated By: Janeece Riggers, M.D.    Mr Lumbar Spine Wo Contrast  02/20/2013  *RADIOLOGY REPORT*  Clinical Data:  Acute onset of paraparesis.  Hyperreflexia concerning for cervical spine lesion.  Associated spasms.  MRI CERVICAL, THORACIC AND LUMBAR SPINE WITHOUT CONTRAST  Technique:  Multiplanar and multiecho pulse sequences of the cervical spine, to include the craniocervical junction and cervicothoracic junction, and thoracic and lumbar spine, were obtained without intravenous contrast.  Comparison:   None.  MRI CERVICAL SPINE  Findings:  Straightening of the cervical lordosis with mild kyphosis.  There is cervical spondylosis.  No cord compression or cord lesion is identified.  C2-3:  Negative  C3-4:  3 mm anterior slip with disc degeneration and facet degeneration.  No cord compression.  C4-5:  Disc degeneration and spondylosis with mild narrowing of the canal but no significant spinal stenosis.  C5-6:  Disc degeneration and spondylosis with mild narrowing of the anterior subarachnoid space.  No cord deformity.  C6-7:  Disc degeneration and spondylosis narrowing the anterior subarachnoid space.  No cord compression.  Mild foraminal narrowing due to spurring bilaterally  C7-T1:  Negative  IMPRESSION: Cervical spondylosis.  No cord compression or significant spinal stenosis.  These findings appear chronic.  MRI THORACIC SPINE  Findings: Negative for fracture or mass in the thoracic spine. Negative  for cord compression.  No focal cord lesion is seen.  The signal in the spinal cord is normal.  No disc protrusion or spinal stenosis is present.  Negative for epidural abscess or evidence of infection.  There is mild disc degeneration at T11-T12  and T12-L1.  IMPRESSION: No acute abnormality in the cervical spine.  Specifically no cord compression or cord lesion is present.  MRI LUMBAR SPINE  Findings: The conus medullaris is normal and terminates at T12-L1.  Negative for fracture or mass in the lumbar spine.  L1-2:  Mild disc bulging without stenosis  L2-3:  Mild posterior slip.  Disc degeneration without stenosis  L3-4:  Disc degeneration with mild spondylosis.  Moderate to advanced facet hypertrophy bilaterally with  bilateral facet joint effusions.  Synovial cyst on the right projects into the canal and could cause impingement of the L4 nerve root on the right in the lateral recess.  The right foramen is mildly narrowed at this level.  Central canal is mildly narrowed without significant spinal stenosis  L4-5:  7 mm anterior slip with moderate disc degeneration and spondylosis.  There is advanced facet degeneration bilaterally with facet hypertrophy and bilateral facet joint effusions.  These findings contribute to foraminal encroachment bilaterally.  There is impingement of the left L4 nerve root in the foramen.  There has been prior decompressive laminectomy on the left.  L5-S1:  Disc degeneration and mild spondylosis and mild facet degeneration.  IMPRESSION: The conus medullaris is normal.  Lumbar degenerative changes as above.  These appear chronic.  I discussed the findings by telephone with Dr. Roseanne Reno.   Original Report Authenticated By: Janeece Riggers, M.D.         Assessment  62 yo F presents with acute onset of bilateral LE weakness and tingling.    1. Bilateral LE weakness: acute onset. Weakness without atrophy and hyperreflexia on exam concerning for UMN lesion. Initial possibilities include  spinal cord compression, vasculitis, transverse myelitis, MS. Patient with C5-C6, L4-L5 and L5-S1 spondylosis on MRI, likely chronic. No spinal cord lesions, masses or abscesses noted to account for physical exam finding.  MRI head shows possible  chronic ischemic changes, possible vasculitis, MS unlikely.  --Neurology Dr. Roseanne Reno, following: started solumedrol 500mg  IV; LP 3/10; stopped lovenox 3/9.   --PT/PTT, CBC this AM prior to LP --Continue IV Solumedrol 500 mg BID.  --F/u neuro exam.   2. Depression --continue home sertraline 50mg  PO once daily  3. Osteopoarthritis --continue home mobic 15 mg PO once daily --vit D 1000 units PO once daily  FEN/GI: Lytes normal. Regular diet.  DVT PPX: d/c lovenox 3/9 for LP 3/10. SCDs in place.  Will restart lovenox post LP Code: Full.  Dispo: pending work-up and stabilization/improvement in neurological findings.    Eli C. Arlana Pouch MS4  PGY-2 Addendum: I have seen and evaluated patient and agree with MS4 note above. In brief, 62 yo F with rapidly progressive lower extremity weakness, with unremarkable spine MRI and brain MRI that could possible represent vasculitis.  S: Pt doing well this morning. No changes or complaints.  O: Gen: Awake, alert, using iPad HEENT: AT, [place]. Good ROM of neck. MMM Cardio: RRR Pulm: Good effort, no wheezes Extremities: Warm. SCD in place Neuro: Slight foot drop bilaterally at rest, but able to move feet. Able to lift lefts and bend all joints. Good grip.  A/P: - Neuro following. Will contact them again about plans for LP (Lovenox stopped, PT/INR checked) - Continue steroids - Serial neuro exams - Restart home Zoloft - Restart VTE ppx s/p procedure  Amber M. Hairford, M.D. 02/21/2013 2:28 PM

## 2013-02-21 NOTE — Progress Notes (Signed)
OT Cancellation Note  Patient Details Name: ARLOA PRAK MRN: [id number] DOB: 1951-01-07   Cancelled Treatment:    Reason Eval/Treat Not Completed: Patient at procedure or test/ unavailable (Pt getting ready to have lumbar puncture.) Will check back on 02/22/13  MCGUIRE,JAMES 02/21/2013, 3:08 PM

## 2013-02-21 NOTE — Clinical Social Work Psychosocial (Signed)
     Clinical Social Work Department BRIEF PSYCHOSOCIAL ASSESSMENT 02/21/2013  Patient:  Gail Gilbert, Gail Gilbert     Account Number:  [id number]     Admit date:  02/19/2013  Clinical Social Worker:  Hulan Fray  Date/Time:  02/21/2013 04:16 PM  Referred by:  Physician  Date Referred:  02/21/2013 Referred for  Other - See comment   Other Referral:   "No job concerned about finances"   Interview type:  Patient Other interview type:    PSYCHOSOCIAL DATA Living Status:  ALONE Admitted from facility:   Level of care:   Primary support name:  Gail Gilbert Primary support relationship to patient:  FAMILY Degree of support available:   Adequate    CURRENT CONCERNS Current Concerns  Financial Resources   Other Concerns:    SOCIAL WORK ASSESSMENT / PLAN Clinical Social Worker received referral for concerns patient had regarding not having employment and financial concerns. Patient had multiple visitors during assessment and patient gave permission for CSW to continue with assessment.    Patient reported that she has not worked since November 2012 and is no longer receiving unemployment benefits. Patient reported concerns regarding not having insurance as well. CSW offered to call financial counselor regarding insurance concern and patient was agreeable. CSW contacted Maddie in Financial Counseling to speak with patient regarding insurance concern. CSW provided list of [facility] assistance programs and [employer] that include employment assistance. Patient did not voice any other concerns. CSW will sign off, as social work intervention is no longer needed.   Assessment/plan status:  Information/Referral to [employer] Other assessment/ plan:   Information/referral to community resources:   [employer]  [place] resource  (employment)    PATIENTS/FAMILYS RESPONSE TO PLAN OF CARE: Patient was appreciative of resources  provided by CSW.

## 2013-02-21 NOTE — Progress Notes (Signed)
Lumbar Puncture Note:   Indication: rule out acute CNS inflammatory process including transverse myelitis.  Description: Lumbar puncture was performed via the L3-4 interspace following sterile preparation and insufflation of 1% lidocaine for local anesthesia. Procedure was atraumatic. CSF was clear in colorless. Opening pressure was 130 mm of CSF. A total of 10 cc of fluid were collected for routine studies and submitted to the laboratory. Patient tolerated procedure well. There were no complications.  Venetia Maxon M.D.  [facility] [phone]

## 2013-02-22 DIAGNOSIS — G373 Acute transverse myelitis in demyelinating disease of central nervous system: Secondary | ICD-10-CM

## 2013-02-22 DIAGNOSIS — R209 Unspecified disturbances of skin sensation: Secondary | ICD-10-CM

## 2013-02-22 DIAGNOSIS — M6281 Muscle weakness (generalized): Secondary | ICD-10-CM

## 2013-02-22 LAB — CBC
Hemoglobin: 12.5 g/dL (ref 12.0–15.0)
MCH: 31.1 pg (ref 26.0–34.0)
RBC: 4.02 MIL/uL (ref 3.87–5.11)
WBC: 13 10*3/uL — ABNORMAL HIGH (ref 4.0–10.5)

## 2013-02-22 LAB — BASIC METABOLIC PANEL
CO2: 25 mEq/L (ref 19–32)
Calcium: 8.9 mg/dL (ref 8.4–10.5)
Chloride: 102 mEq/L (ref 96–112)
Glucose, Bld: 137 mg/dL — ABNORMAL HIGH (ref 70–99)
Sodium: 138 mEq/L (ref 135–145)

## 2013-02-22 MED ORDER — METHYLPREDNISOLONE SODIUM SUCC 125 MG IJ SOLR
100.0000 mg | Freq: Two times a day (BID) | INTRAMUSCULAR | Status: DC
  Administered 2013-02-22 – 2013-02-23 (×2): 100 mg via INTRAVENOUS
  Filled 2013-02-22: qty 1.6
  Filled 2013-02-22: qty 2
  Filled 2013-02-22 (×3): qty 1.6

## 2013-02-22 MED ORDER — BISACODYL 5 MG PO TBEC
5.0000 mg | DELAYED_RELEASE_TABLET | Freq: Once | ORAL | Status: AC
  Administered 2013-02-22: 5 mg via ORAL
  Filled 2013-02-22: qty 1

## 2013-02-22 MED ORDER — POLYETHYLENE GLYCOL 3350 17 G PO PACK
17.0000 g | PACK | Freq: Every day | ORAL | Status: DC
  Administered 2013-02-23 – 2013-03-01 (×7): 17 g via ORAL
  Filled 2013-02-22 (×8): qty 1

## 2013-02-22 MED ORDER — ENOXAPARIN SODIUM 40 MG/0.4ML ~~LOC~~ SOLN
40.0000 mg | SUBCUTANEOUS | Status: DC
  Administered 2013-02-22 – 2013-03-01 (×8): 40 mg via SUBCUTANEOUS
  Filled 2013-02-22 (×8): qty 0.4

## 2013-02-22 NOTE — Progress Notes (Signed)
Pt for possible CIR admission.  Will f/up tomorrow.  [phone]

## 2013-02-22 NOTE — Evaluation (Signed)
Physical Therapy Evaluation Patient Details Name: Gail Gilbert MRN: [id number] DOB: Jun 24, 1951 Today's Date: 02/22/2013 Time: 5621-3086 PT Time Calculation (min): 25 min  PT Assessment / Plan / Recommendation Clinical Impression  Pt is a 62 yo female admitted for onset of bilat LE weakness. Suspected transverse myelitis. Pt was living alone and functioning independently now presenting with bilat LE weakness, ataxia, poor motor/planning and sequencing and signficant balance impairement. Pt an excellent canidate for CIR to address mentioned deficits and achieve modifiend independent function for safe transition home.    PT Assessment  Patient needs continued PT services    Follow Up Recommendations  CIR    Does the patient have the potential to tolerate intense rehabilitation      Barriers to Discharge None pt has 24/7 supervision avail via family    Equipment Recommendations   (TBD)    Recommendations for Other Services Rehab consult   Frequency Min 4X/week    Precautions / Restrictions Precautions Precautions: Fall Restrictions Weight Bearing Restrictions: No   Pertinent Vitals/Pain Denies pain. C/o rectal discomfort from not having BM since saturday      Mobility  Bed Mobility Bed Mobility: Not assessed Transfers Transfers: Sit to Stand;Stand to Sit;Stand Pivot Transfers Sit to Stand: 1: +2 Total assist;With upper extremity assist;From bed;From chair/3-in-1 Sit to Stand: Patient Percentage: 50% Stand to Sit: 1: +2 Total assist;To chair/3-in-1;With armrests Stand to Sit: Patient Percentage: 50% Stand Pivot Transfers: 1: +2 Total assist Stand Pivot Transfers: Patient Percentage: 40% Details for Transfer Assistance: maxA to maintain standing, maxA for LE advancement due to ataxia and inability to co-ordinate step. pt with inverted foot and inability to place LE in supportive position to support weight Ambulation/Gait Ambulation/Gait Assistance: Not tested (comment)    Exercises     PT Diagnosis: Difficulty walking;Generalized weakness  PT Problem List: Decreased strength;Decreased activity tolerance;Decreased balance;Decreased mobility;Decreased coordination;Impaired sensation PT Treatment Interventions: Gait training;Functional mobility training;Therapeutic activities;Therapeutic exercise;Balance training;Neuromuscular re-education   PT Goals Acute Rehab PT Goals PT Goal Formulation: With patient Time For Goal Achievement: 03/01/13 Potential to Achieve Goals: Good Pt will go Supine/Side to Sit: with supervision;with HOB 0 degrees PT Goal: Supine/Side to Sit - Progress: Goal set today Pt will go Sit to Stand: with min assist;with upper extremity assist (up to RW) PT Goal: Sit to Stand - Progress: Goal set today Pt will Transfer Bed to Chair/Chair to Bed: with mod assist (with RW) PT Transfer Goal: Bed to Chair/Chair to Bed - Progress: Goal set today Pt will Stand: with mod assist;1 - 2 min;with bilateral upper extremity support (without bilat knee buckling) PT Goal: Stand - Progress: Goal set today Pt will Ambulate: 1 - 15 feet;with mod assist;with rolling walker PT Goal: Ambulate - Progress: Goal set today Pt will Perform Home Exercise Program: Independently PT Goal: Perform Home Exercise Program - Progress: Goal set today  Visit Information  Last PT Received On: 02/22/13 Assistance Needed: +2 PT/OT Co-Evaluation/Treatment: Yes    Subjective Data  Subjective: Pt received sitting EOB with OT with request to use [facility]   Prior Functioning  Home Living Lives With: Alone Available Help at Discharge: Family;Available 24 hours/day Type of Home: Apartment Home Access: Stairs to enter [employer] of Steps: 10 Entrance Stairs-Rails: Right Home Layout: One level Bathroom Shower/Tub: Engineer, manufacturing systems: Standard Bathroom Accessibility: Yes How Accessible: Accessible via walker Additional Comments: can stay with son who has  1 level, 1 STE, combo shower/tub with curtain Prior Function Level of Independence:  Independent Able to Take Stairs?: Yes Driving: Yes Vocation: Unemployed Comments: glasses Communication Communication: No difficulties Dominant Hand: Right    Cognition  Cognition Overall Cognitive Status: Impaired Arousal/Alertness: Awake/alert Orientation Level: Oriented X4 / Intact Behavior During Session: WFL for tasks performed Cognition - Other Comments: delayed processing    Extremity/Trunk Assessment Right Upper Extremity Assessment RUE ROM/Strength/Tone: Within functional levels RUE Sensation: Deficits RUE Sensation Deficits: mild numbness in R medial forearm and R 5th digit/lateral hand RUE Coordination: WFL - gross/fine motor Left Upper Extremity Assessment LUE ROM/Strength/Tone: Within functional levels LUE Sensation: WFL - Light Touch LUE Coordination: WFL - gross/fine motor Right Lower Extremity Assessment RLE ROM/Strength/Tone: Deficits RLE ROM/Strength/Tone Deficits: grossly 3+/5 RLE Sensation: Deficits RLE Sensation Deficits: can feel pain, but minimal light touch RLE Coordination: Deficits RLE Coordination Deficits: pt ataxic, poor motor planning/control Left Lower Extremity Assessment LLE ROM/Strength/Tone: Deficits LLE ROM/Strength/Tone Deficits: grossly 3+/5 LLE Sensation: Deficits LLE Sensation Deficits: can feel pain but minimal light touch LLE Coordination: Deficits LLE Coordination Deficits: pt ataxic, poor motor planning/control Trunk Assessment Trunk Assessment: Normal   Balance Balance Balance Assessed: Yes Static Sitting Balance Static Sitting - Balance Support: No upper extremity supported Static Sitting - Level of Assistance: 4: Min assist;3: Mod assist Static Sitting - Comment/# of Minutes: pt requires min guard for supported sitting Dynamic Sitting Balance Dynamic Sitting - Balance Support: Right upper extremity supported Dynamic Sitting - Level of  Assistance: 3: Mod assist Dynamic Sitting - Comments: to perform hygiene s/p BM Static Standing Balance Static Standing - Balance Support: Bilateral upper extremity supported Static Standing - Level of Assistance: 1: +2 Total assist Static Standing - Comment/# of Minutes: 1 min for hygiene  End of Session PT - End of Session Equipment Utilized During Treatment: Gait belt Activity Tolerance: Patient tolerated treatment well Patient left: in chair;with call bell/phone within reach Nurse Communication: Mobility status (how to safely transfer pt with 2 person assist)  GP     Marcene Brawn 02/22/2013, 8:56 AM  Lewis Shock, PT, DPT Pager #: [phone] Office #: [phone]

## 2013-02-22 NOTE — Evaluation (Signed)
Occupational Therapy Evaluation Patient Details Name: Gail Gilbert MRN: [id number] DOB: 04-14-1951 Today's Date: 02/22/2013 Time: 0865-7846 OT Time Calculation (min): 25 min  OT Assessment / Plan / Recommendation Clinical Impression  This 62 yo female admitted with bilateral leg tingling/numbness and weakness presents to acute OT with problems below. Will benefit from acute OT with follow OT at [facility]    OT Assessment  Patient needs continued OT Services    Follow Up Recommendations  CIR    Barriers to Discharge None    Equipment Recommendations  3 in 1 bedside comode    Recommendations for Other Services Rehab consult  Frequency  Min 2X/week    Precautions / Restrictions Precautions Precautions: Fall Restrictions Weight Bearing Restrictions: No       ADL  Eating/Feeding: Independent Where Assessed - Eating/Feeding: Bed level Grooming: Moderate assistance Where Assessed - Grooming: Unsupported sitting Upper Body Bathing: Moderate assistance Where Assessed - Upper Body Bathing: Unsupported sitting Lower Body Bathing: Maximal assistance Where Assessed - Lower Body Bathing: Supported sit to stand (+2 total A (pt=50%) for sit to stand and maintain standing) Upper Body Dressing: Moderate assistance Where Assessed - Upper Body Dressing: Unsupported sitting Lower Body Dressing: +1 Total assistance Where Assessed - Lower Body Dressing: Supported sit to stand (+2 total A (pt=50%) for sit to stand and maintain standing) Toilet Transfer: +2 Total assistance Toilet Transfer: Patient Percentage: 40% Toilet Transfer Method: Stand pivot Acupuncturist: Bedside commode Toileting - Clothing Manipulation and Hygiene: Performed;+1 Total assistance Where Assessed - Glass blower/designer Manipulation and Hygiene: Standing (With additionall +1 for maintaining standing) Equipment Used: Gait belt Transfers/Ambulation Related to ADLs: Total A +2 (pt=50%) for sit to stand, stand to sit,  stand pivot    OT Diagnosis: Generalized weakness;Cognitive deficits  OT Problem List: Decreased strength;Impaired balance (sitting and/or standing);Decreased coordination;Decreased cognition;Decreased knowledge of use of DME or AE OT Treatment Interventions: Self-care/ADL training;Therapeutic activities;Neuromuscular education;DME and/or AE instruction;Patient/family education;Balance training   OT Goals Acute Rehab OT Goals OT Goal Formulation: With patient Time For Goal Achievement: 03/08/13 Potential to Achieve Goals: Good ADL Goals Pt Will Perform Grooming: with set-up;with supervision;Unsupported;Sitting, edge of bed ADL Goal: Grooming - Progress: Goal set today Pt Will Transfer to Toilet: with mod assist;Stand pivot transfer;with DME;3-in-1 ADL Goal: Toilet Transfer - Progress: Goal set today Miscellaneous OT Goals Miscellaneous OT Goal #1: Pt will be Mod A sit to stand from all surfaces OT Goal: Miscellaneous Goal #1 - Progress: Goal set today Miscellaneous OT Goal #2: Pt will be able to maintain EOB dynamic balance with Min A OT Goal: Miscellaneous Goal #2 - Progress: Goal set today  Visit Information  Last OT Received On: 02/22/13 Assistance Needed: +2    Subjective Data  Subjective: I feel like I need to go to the bathroom   Prior Functioning     Home Living Lives With: Alone Available Help at Discharge: Family;Available 24 hours/day Type of Home: Apartment Home Access: Stairs to enter [employer] of Steps: 10 Entrance Stairs-Rails: Right Home Layout: One level Bathroom Shower/Tub: Engineer, manufacturing systems: Standard Bathroom Accessibility: Yes How Accessible: Accessible via walker Additional Comments: can stay with son who has 1 level, 1 STE, combo shower/tub with curtain Prior Function Level of Independence: Independent Able to Take Stairs?: Yes Driving: Yes Vocation: Unemployed Comments: glasses Communication Communication: No  difficulties Dominant Hand: Right         Vision/Perception Vision - History Baseline Vision: No visual deficits   Cognition  Cognition Overall Cognitive Status: Impaired Arousal/Alertness: Awake/alert Orientation Level: Oriented X4 / Intact Behavior During Session: WFL for tasks performed Cognition - Other Comments: delayed processing    Extremity/Trunk Assessment Right Upper Extremity Assessment RUE ROM/Strength/Tone: Within functional levels RUE Sensation: Deficits RUE Sensation Deficits: mild numbness in R medial forearm and R 5th digit/lateral hand RUE Coordination: WFL - gross/fine motor Left Upper Extremity Assessment LUE ROM/Strength/Tone: Within functional levels LUE Sensation: WFL - Light Touch LUE Coordination: WFL - gross/fine motor Right Lower Extremity Assessment RLE ROM/Strength/Tone: Deficits RLE ROM/Strength/Tone Deficits: grossly 3+/5 RLE Sensation: Deficits RLE Sensation Deficits: can feel pain, but minimal light touch RLE Coordination: Deficits RLE Coordination Deficits: pt ataxic, poor motor planning/control Left Lower Extremity Assessment LLE ROM/Strength/Tone: Deficits LLE ROM/Strength/Tone Deficits: grossly 3+/5 LLE Sensation: Deficits LLE Sensation Deficits: can feel pain but minimal light touch LLE Coordination: Deficits LLE Coordination Deficits: pt ataxic, poor motor planning/control Trunk Assessment Trunk Assessment: Normal     Mobility Bed Mobility Bed Mobility: Not assessed Transfers Sit to Stand: 1: +2 Total assist;With upper extremity assist;From bed;From chair/3-in-1 Sit to Stand: Patient Percentage: 50% Stand to Sit: 1: +2 Total assist;To chair/3-in-1;With armrests Stand to Sit: Patient Percentage: 50% Details for Transfer Assistance: maxA to maintain standing, maxA for LE advancement due to ataxia and inability to co-ordinate step. pt with inverted foot and inability to place LE in supportive position to support weight         Balance Balance Balance Assessed: Yes Static Sitting Balance Static Sitting - Balance Support: No upper extremity supported Static Sitting - Level of Assistance: 4: Min assist;3: Mod assist Static Sitting - Comment/# of Minutes: pt requires min guard for supported sitting Dynamic Sitting Balance Dynamic Sitting - Balance Support: Right upper extremity supported Dynamic Sitting - Level of Assistance: 3: Mod assist Dynamic Sitting - Comments: to perform hygiene s/p BM Static Standing Balance Static Standing - Balance Support: Bilateral upper extremity supported Static Standing - Level of Assistance: 1: +2 Total assist Static Standing - Comment/# of Minutes: 1 min for hygiene   End of Session OT - End of Session Equipment Utilized During Treatment: Gait belt Activity Tolerance: Patient tolerated treatment well Patient left: in chair;with call bell/phone within reach Nurse Communication: Mobility status (+2 stand pivot back to bed or 3n1)    Evette Georges [phone] 02/22/2013, 9:02 AM

## 2013-02-22 NOTE — Consult Note (Signed)
Physical Medicine and Rehabilitation Consult Reason for Consult: Suspect transverse myelitis Referring Physician: Dr. Leveda Anna   HPI: Gail Gilbert is a 62 y.o. right-handed female with history of low back pain status post lumbar L4 decompression laminectomy 1999, restless leg syndrome.. Admitted 02/21/2011 with tingling in her feet and lower extremity weakness with staggering gait as well his right upper extremity numbness and weakness. She denied any visual changes or slurred speech. Patient did note some increased urinary retention. MRI of the brain with no acute infarct or mass. There was some chronic microvascular ischemia. MRI cervical thoracic lumbar spine with no cord compression. Neurology services consulted with suspect transverse myelitis and placed on intravenous Solu-Medrol. Lumbar puncture was pending. Physical and occupational therapy evaluations completed 02/22/2013 an ongoing with recommendations of physical medicine rehabilitation consult to consider inpatient rehabilitation services  Review of Systems  Gastrointestinal:       Reflux  Genitourinary:       Urinary retention  Neurological: Positive for tingling, weakness and headaches.  Psychiatric/Behavioral: Positive for depression. The patient has insomnia.        Anxiety  All other systems reviewed and are negative.   Past Medical History  Diagnosis Date  . Arthritis     cervical,feet  . Depression   . Migraine   . GERD (gastroesophageal reflux disease) 12/2006    esophageal stricture  . Vitamin D deficiency   . MVP (mitral valve prolapse)   . Spondylolisthesis     L4-5  . RLS (restless legs syndrome)   . Anxiety   . Interstitial cystitis   . IBS (irritable bowel syndrome)   . Insomnia   . DVT (deep vein thrombosis) in pregnancy 1989    postpartum   Past Surgical History  Procedure Laterality Date  . Colonoscopy    . Bunionectomy  09,05    both feet  . Upper gastrointestinal endoscopy    . Ear examination  under anesthesia      to correct ringing in ear at [facility]  . Lumbar aspiration and drainage      cyst  . Hammer toe surgery  10/29/2011    Procedure: HAMMER TOE CORRECTION;  Surgeon: Nestor Lewandowsky;  Location: [facility];  Service: Orthopedics;  Laterality: Left;  Left 2nd toe duvries arthroplasty  . Decompressive lumbar laminectomy level 4  1999   Family History  Problem Relation Age of Onset  . Arthritis Mother     spinal stenosis  . Cataracts Mother   . Heart disease Mother     pacemaker  . Hypertension Father   . Heart disease Father     pacer/defibrillator   Social History:  reports that she has never smoked. She has never used smokeless tobacco. She reports that  drinks alcohol. She reports that she does not use illicit drugs. Allergies:  Allergies  Allergen Reactions  . Antihistamines, Diphenhydramine-Type Palpitations   Medications Prior to Admission  Medication Sig Dispense Refill  . cholecalciferol (VITAMIN D) 1000 UNITS tablet Take 1,000 Units by mouth every morning.      . fish oil-omega-3 fatty acids 1000 MG capsule Take 2 g by mouth every morning.       . Melatonin 5 MG CAPS Take 1 capsule by mouth at bedtime as needed (sleep).      . meloxicam (MOBIC) 15 MG tablet Take 15 mg by mouth every morning.      . Multiple Vitamin (MULTIVITAMIN) capsule Take 1 capsule by mouth daily.        Marland Kitchen  sertraline (ZOLOFT) 100 MG tablet Take 50 mg by mouth every morning.        Home: Home Living Lives With: Alone Available Help at Discharge: Family;Available 24 hours/day Type of Home: Apartment Home Access: Stairs to enter Entrance Stairs-Number of Steps: 10 Entrance Stairs-Rails: Right Home Layout: One level Bathroom Shower/Tub: Engineer, manufacturing systems: Standard Bathroom Accessibility: Yes How Accessible: Accessible via walker Additional Comments: can stay with son who has 1 level, 1 STE, combo shower/tub with curtain  Functional History: Prior  Function Able to Take Stairs?: Yes Driving: Yes Vocation: Unemployed Comments: glasses Functional Status:  Mobility: Bed Mobility Bed Mobility: Not assessed Transfers Transfers: Sit to Stand;Stand to Sit;Stand Pivot Transfers Sit to Stand: 1: +2 Total assist;With upper extremity assist;From bed;From chair/3-in-1 Sit to Stand: Patient Percentage: 50% Stand to Sit: 1: +2 Total assist;To chair/3-in-1;With armrests Stand to Sit: Patient Percentage: 50% Stand Pivot Transfers: 1: +2 Total assist Stand Pivot Transfers: Patient Percentage: 40% Ambulation/Gait Ambulation/Gait Assistance: Not tested (comment)    ADL: ADL Eating/Feeding: Independent Where Assessed - Eating/Feeding: Bed level Grooming: Moderate assistance Where Assessed - Grooming: Unsupported sitting Upper Body Bathing: Moderate assistance Where Assessed - Upper Body Bathing: Unsupported sitting Lower Body Bathing: Maximal assistance Where Assessed - Lower Body Bathing: Supported sit to stand (+2 total A (pt=50%) for sit to stand and maintain standing) Upper Body Dressing: Moderate assistance Where Assessed - Upper Body Dressing: Unsupported sitting Lower Body Dressing: +1 Total assistance Where Assessed - Lower Body Dressing: Supported sit to stand (+2 total A (pt=50%) for sit to stand and maintain standing) Toilet Transfer: +2 Total assistance Toilet Transfer Method: Stand pivot Toilet Transfer Equipment: Bedside commode Equipment Used: Gait belt Transfers/Ambulation Related to ADLs: Total A +2 (pt=50%) for sit to stand, stand to sit, stand pivot  Cognition: Cognition Arousal/Alertness: Awake/alert Orientation Level: Oriented X4;Oriented to person;Oriented to place;Oriented to time;Oriented to situation Cognition Overall Cognitive Status: Impaired Arousal/Alertness: Awake/alert Orientation Level: Oriented X4 / Intact Behavior During Session: WFL for tasks performed Cognition - Other Comments: delayed  processing  Blood pressure 117/72, pulse 80, temperature 98.3 F (36.8 C), temperature source Oral, resp. rate 17, SpO2 93.00%. Physical Exam  Vitals reviewed. Constitutional: She is oriented to person, place, and time. She appears well-developed.  HENT:  Head: Normocephalic.  Eyes: EOM are normal.  Neck: Normal range of motion. Neck supple. No thyromegaly present.  Cardiovascular: Normal rate and regular rhythm.   Pulmonary/Chest: Breath sounds normal. No respiratory distress.  Abdominal: Soft. Bowel sounds are normal. She exhibits no distension. There is no tenderness.  Musculoskeletal: She exhibits no edema.  Decreased sensation to light touch lower extremities  Neurological: She is alert and oriented to person, place, and time.  T7-8 Sensory level. DTR's hypereflexic in either leg. No resting tone. Strength 4/5 distally at ankles. 3 at knees, 2-3 at hips. Does sense deep pain stimulation in legs. UE's intact  Skin: Skin is warm and dry.  Psychiatric: She has a normal mood and affect. Her behavior is normal. Judgment and thought content normal.    Results for orders placed during the hospital encounter of 02/19/13 (from the past 24 hour(s))  CBC     Status: Abnormal   Collection Time    02/21/13  1:14 PM      Result Value Range   WBC 13.1 (*) 4.0 - 10.5 K/uL   RBC 4.07  3.87 - 5.11 MIL/uL   Hemoglobin 12.8  12.0 - 15.0 g/dL   HCT 36.7  36.0 - 46.0 %   MCV 90.2  78.0 - 100.0 fL   MCH 31.4  26.0 - 34.0 pg   MCHC 34.9  30.0 - 36.0 g/dL   RDW 27.2  53.6 - 64.4 %   Platelets 286  150 - 400 K/uL  PROTIME-INR     Status: None   Collection Time    02/21/13  1:14 PM      Result Value Range   Prothrombin Time 13.7  11.6 - 15.2 seconds   INR 1.06  0.00 - 1.49  CSF CELL COUNT WITH DIFFERENTIAL     Status: Abnormal   Collection Time    02/21/13  4:08 PM      Result Value Range   Tube # 1     Color, CSF COLORLESS  COLORLESS   Appearance, CSF CLEAR  CLEAR   Supernatant NOT  INDICATED     RBC Count, CSF 6 (*) 0 /cu mm   WBC, CSF 1  0 - 5 /cu mm   Segmented Neutrophils-CSF NONE SEEN  0 - 6 %   Lymphs, CSF FEW  40 - 80 %   Monocyte-Macrophage-Spinal Fluid RARE  15 - 45 %   Eosinophils, CSF NONE SEEN  0 - 1 %  CSF CULTURE     Status: None   Collection Time    02/21/13  4:09 PM      Result Value Range   Specimen Description CSF     Special Requests NONE     Gram Stain       Value: WBC PRESENT, PREDOMINANTLY MONONUCLEAR     NO ORGANISMS SEEN     CYTOSPUN Performed at [facility]   Culture NO GROWTH     Report Status PENDING    PROTEIN AND GLUCOSE, CSF     Status: Abnormal   Collection Time    02/21/13  4:10 PM      Result Value Range   Glucose, CSF 100 (*) 43 - 76 mg/dL   Total  Protein, CSF 32  15 - 45 mg/dL  GRAM STAIN     Status: None   Collection Time    02/21/13  4:10 PM      Result Value Range   Specimen Description CSF     Special Requests NONE     Gram Stain       Value: CYTOSPIN SLIDE     WBC PRESENT, PREDOMINANTLY MONONUCLEAR     NO ORGANISMS SEEN   Report Status 02/21/2013 FINAL    CBC     Status: Abnormal   Collection Time    02/22/13  6:10 AM      Result Value Range   WBC 13.0 (*) 4.0 - 10.5 K/uL   RBC 4.02  3.87 - 5.11 MIL/uL   Hemoglobin 12.5  12.0 - 15.0 g/dL   HCT 03.4 (*) 74.2 - 59.5 %   MCV 89.1  78.0 - 100.0 fL   MCH 31.1  26.0 - 34.0 pg   MCHC 34.9  30.0 - 36.0 g/dL   RDW 63.8  75.6 - 43.3 %   Platelets 271  150 - 400 K/uL  BASIC METABOLIC PANEL     Status: Abnormal   Collection Time    02/22/13  6:10 AM      Result Value Range   Sodium 138  135 - 145 mEq/L   Potassium 3.9  3.5 - 5.1 mEq/L   Chloride 102  96 - 112 mEq/L  CO2 25  19 - 32 mEq/L   Glucose, Bld 137 (*) 70 - 99 mg/dL   BUN 21  6 - 23 mg/dL   Creatinine, Ser 4.69  0.50 - 1.10 mg/dL   Calcium 8.9  8.4 - 62.9 mg/dL   GFR calc non Af Amer >90  >90 mL/min   GFR calc Af Amer >90  >90 mL/min   Mr Laqueta Jean Wo Contrast  02/20/2013  *RADIOLOGY  REPORT*  Clinical Data: Bilateral leg weakness.  Rule out demyelinating disease.  MRI HEAD WITHOUT AND WITH CONTRAST  Technique:  Multiplanar, multiecho pulse sequences of the brain and surrounding structures were obtained according to standard protocol without and with intravenous contrast  Contrast: 13mL MULTIHANCE GADOBENATE DIMEGLUMINE 529 MG/ML IV SOLN  Comparison: None.  Findings: Ventricle size is normal.  Craniocervical junction is normal.  Pituitary is not enlarged.  Scattered small subcortical white matter hyperintensities are present on the right.  Left cerebral white matter is normal.  Basal ganglion brainstem and cerebellum are normal.  Diffusion weighted imaging is negative.  No acute infarct. Negative for hemorrhage or mass lesion.  No pathologic enhancement following contrast administration.  Chronic sinusitis with mucosal thickening in the paranasal sinuses.  IMPRESSION: Scattered small subcortical white matter hyperintensities in the cerebral white matter on the right.  These are most likely related to chronic microvascular ischemia.  Differential includes migraine headache and vasculitis.  No acute infarct or mass.  Multiple sclerosis not felt likely.   Original Report Authenticated By: Janeece Riggers, M.D.     Assessment/Plan: Diagnosis: Thoracic transverse myelitis 1. Does the need for close, 24 hr/day medical supervision in concert with the patient's rehab needs make it unreasonable for this patient to be served in a less intensive setting? Yes 2. Co-Morbidities requiring supervision/potential complications: rls, anxiety 3. Due to bladder management, bowel management, safety, skin/wound care, disease management, medication administration, pain management and patient education, does the patient require 24 hr/day rehab nursing? Yes 4. Does the patient require coordinated care of a physician, rehab nurse, PT (1-2 hrs/day, 5 days/week) and OT (1-2 hrs/day, 5 days/week) to address physical and  functional deficits in the context of the above medical diagnosis(es)? Yes Addressing deficits in the following areas: balance, endurance, locomotion, strength, transferring, bowel/bladder control, bathing, dressing, feeding, grooming, toileting and psychosocial support 5. Can the patient actively participate in an intensive therapy program of at least 3 hrs of therapy per day at least 5 days per week? Yes 6. The potential for patient to make measurable gains while on inpatient rehab is excellent 7. Anticipated functional outcomes upon discharge from inpatient rehab are supervision to min assist with PT, supervision to min assist with OT, n/a  with SLP. 8. Estimated rehab length of stay to reach the above functional goals is: 2-3 weeks 9. Does the patient have adequate social supports to accommodate these discharge functional goals? Yes and Potentially 10. Anticipated D/C setting: Home 11. Anticipated post D/C treatments: Outpt therapy 12. Overall Rehab/Functional Prognosis: excellent  RECOMMENDATIONS: This patient's condition is appropriate for continued rehabilitative care in the following setting: CIR Patient has agreed to participate in recommended program. Yes Note that insurance prior authorization may be required for reimbursement for recommended care.  Comment:Rehab RN to follow up.   Ranelle Oyster, MD, [place] [facility]     02/22/2013

## 2013-02-22 NOTE — Progress Notes (Signed)
Patient ID: Gail Gilbert, female   DOB: [birth year], 62 y.o.   MRN: [id number] PGY-1 Daily Progress Note Family Medicine Teaching Service [name] Service Pager: [phone]   Subjective: Patient sitting in chair after PT visit.   Feels a little bit stronger and right hand coordination is better.  Unable to walk without significant PT assistance. No pain. Appetite good, no SOB, visual changes, h/a, fevers/chills.  ROS: bowel cramps, has not had BM for two days.  Occasional shakes in legs. She feels this is nerves.    Objective:  VITALS Temp:  [97.5 F (36.4 C)-98.9 F (37.2 C)] 98.3 F (36.8 C) (03/11 0616) Pulse Rate:  [80-88] 80 (03/11 0616) Resp:  [17-19] 17 (03/11 0616) BP: (112-124)/(63-72) 117/72 mmHg (03/11 0616) SpO2:  [93 %-97 %] 93 % (03/11 0616)  In/Out  Intake/Output Summary (Last 24 hours) at 02/22/13 0758 Last data filed at 02/22/13 0500  Gross per 24 hour  Intake    840 ml  Output   3550 ml  Net  -2710 ml    Physical Exam: Gen:  NAD, sitting up in chair.  HEENT: moist mucous membranes CV: Regular rate and rhythm, no murmurs rubs or gallops PULM: clear to auscultation bilaterally. No wheezes/rales/rhonchi ABD: soft/nontender/nondistended/normal bowel sounds EXT: No edema Skin: LP site clean and dry.  Neuro: Alert and oriented x3. LE strength 4/5 bilateral foot dorsi/plantar flexion, 4/5 bilateral leg extension/flexion, 3/5 bilateral hip extension/flexion, unable to stand. UE right 5/5 grip strength, 5/5 left.  Sensation intact to light touch bilaterally U/L E, reduced sensation to temperature in LE. Unable to assess gait. Sustained clonus bilaterally at ankles. Did not assess reflexes.   MEDS Scheduled Meds: . baclofen  5 mg Oral QID  . cholecalciferol  1,000 Units Oral q morning - 10a  . meloxicam  15 mg Oral Daily  . methylPREDNISolone (SOLU-MEDROL) injection  500 mg Intravenous Once  . methylPREDNISolone (SOLU-MEDROL) injection  500 mg Intravenous Q12H   . polyethylene glycol  17 g Oral Daily  . sertraline  50 mg Oral q morning - 10a  . sodium chloride  3 mL Intravenous Q12H   Continuous Infusions:  PRN Meds:.povidone-iodine, zolpidem  Labs and imaging:   CBC  Recent Labs Lab 02/20/13 1300 02/21/13 1314 02/22/13 0610  WBC 8.2 13.1* 13.0*  HGB 13.5 12.8 12.5  HCT 38.4 36.7 35.8*  PLT 300 286 271   BMET/CMET  Recent Labs Lab 02/20/13 0055 02/20/13 1300 02/22/13 0610  NA 136  --  138  K 3.8  --  3.9  CL 101  --  102  CO2 25  --  25  BUN 16  --  21  CREATININE 0.58 0.52 0.53  CALCIUM 9.1  --  8.9  GLUCOSE 124*  --  137*   Results for orders placed during the hospital encounter of 02/19/13 (from the past 24 hour(s))  CBC     Status: Abnormal   Collection Time    02/21/13  1:14 PM      Result Value Range   WBC 13.1 (*) 4.0 - 10.5 K/uL   RBC 4.07  3.87 - 5.11 MIL/uL   Hemoglobin 12.8  12.0 - 15.0 g/dL   HCT 14.7  82.9 - 56.2 %   MCV 90.2  78.0 - 100.0 fL   MCH 31.4  26.0 - 34.0 pg   MCHC 34.9  30.0 - 36.0 g/dL   RDW 13.0  86.5 - 78.4 %   Platelets 286  150 - 400 K/uL  PROTIME-INR     Status: None   Collection Time    02/21/13  1:14 PM      Result Value Range   Prothrombin Time 13.7  11.6 - 15.2 seconds   INR 1.06  0.00 - 1.49  CSF CELL COUNT WITH DIFFERENTIAL     Status: Abnormal   Collection Time    02/21/13  4:08 PM      Result Value Range   Tube # 1     Color, CSF COLORLESS  COLORLESS   Appearance, CSF CLEAR  CLEAR   Supernatant NOT INDICATED     RBC Count, CSF 6 (*) 0 /cu mm   WBC, CSF 1  0 - 5 /cu mm   Segmented Neutrophils-CSF NONE SEEN  0 - 6 %   Lymphs, CSF FEW  40 - 80 %   Monocyte-Macrophage-Spinal Fluid RARE  15 - 45 %   Eosinophils, CSF NONE SEEN  0 - 1 %  CSF CULTURE     Status: None   Collection Time    02/21/13  4:09 PM      Result Value Range   Specimen Description CSF     Special Requests NONE     Gram Stain PENDING     Culture NO GROWTH     Report Status PENDING    PROTEIN  AND GLUCOSE, CSF     Status: Abnormal   Collection Time    02/21/13  4:10 PM      Result Value Range   Glucose, CSF 100 (*) 43 - 76 mg/dL   Total  Protein, CSF 32  15 - 45 mg/dL  GRAM STAIN     Status: None   Collection Time    02/21/13  4:10 PM      Result Value Range   Specimen Description CSF     Special Requests NONE     Gram Stain       Value: CYTOSPIN SLIDE     WBC PRESENT, PREDOMINANTLY MONONUCLEAR     NO ORGANISMS SEEN   Report Status 02/21/2013 FINAL    CBC     Status: Abnormal   Collection Time    02/22/13  6:10 AM      Result Value Range   WBC 13.0 (*) 4.0 - 10.5 K/uL   RBC 4.02  3.87 - 5.11 MIL/uL   Hemoglobin 12.5  12.0 - 15.0 g/dL   HCT 16.1 (*) 09.6 - 04.5 %   MCV 89.1  78.0 - 100.0 fL   MCH 31.1  26.0 - 34.0 pg   MCHC 34.9  30.0 - 36.0 g/dL   RDW 40.9  81.1 - 91.4 %   Platelets 271  150 - 400 K/uL  BASIC METABOLIC PANEL     Status: Abnormal   Collection Time    02/22/13  6:10 AM      Result Value Range   Sodium 138  135 - 145 mEq/L   Potassium 3.9  3.5 - 5.1 mEq/L   Chloride 102  96 - 112 mEq/L   CO2 25  19 - 32 mEq/L   Glucose, Bld 137 (*) 70 - 99 mg/dL   BUN 21  6 - 23 mg/dL   Creatinine, Ser 7.82  0.50 - 1.10 mg/dL   Calcium 8.9  8.4 - 95.6 mg/dL   GFR calc non Af Amer >90  >90 mL/min   GFR calc Af Amer >90  >90 mL/min   Mr  Brain W Wo Contrast  02/20/2013  *RADIOLOGY REPORT*  Clinical Data: Bilateral leg weakness.  Rule out demyelinating disease.  MRI HEAD WITHOUT AND WITH CONTRAST  Technique:  Multiplanar, multiecho pulse sequences of the brain and surrounding structures were obtained according to standard protocol without and with intravenous contrast  Contrast: 13mL MULTIHANCE GADOBENATE DIMEGLUMINE 529 MG/ML IV SOLN  Comparison: None.  Findings: Ventricle size is normal.  Craniocervical junction is normal.  Pituitary is not enlarged.  Scattered small subcortical white matter hyperintensities are present on the right.  Left cerebral white matter is  normal.  Basal ganglion brainstem and cerebellum are normal.  Diffusion weighted imaging is negative.  No acute infarct. Negative for hemorrhage or mass lesion.  No pathologic enhancement following contrast administration.  Chronic sinusitis with mucosal thickening in the paranasal sinuses.  IMPRESSION: Scattered small subcortical white matter hyperintensities in the cerebral white matter on the right.  These are most likely related to chronic microvascular ischemia.  Differential includes migraine headache and vasculitis.  No acute infarct or mass.  Multiple sclerosis not felt likely.   Original Report Authenticated By: Janeece Riggers, M.D.         Assessment  62 yo F presents with acute onset of bilateral LE weakness and tingling and RUE weakness and tingling.     1. Bilateral LE and RUE weakness: acute onset. Weakness without atrophy and with hyperreflexia on exam concerning for UMN lesion. Initial possibilities include spinal cord compression, vasculitis, transverse myelitis, MS. MRI spinal cord showed no cord compression. Patient with C5-C6, L4-L5 and L5-S1 spondylosis on MRI, likely chronic. No spinal cord lesions, masses or abscesses noted to account for physical exam finding.  MRI head shows possible chronic ischemic changes, possible vasculitis, MS unlikely.  --Neurology Dr. Roseanne Reno, following: started solumedrol 500mg  IV 3/9;  --change IV Solumedrol to 100 mg IV BID 3/11 per neurology recs.   --restart lovenox 1mg /kg 3/11 --Baclofen 5mg  PO QID for spasticity 3/11 --F/u neuro exam.  --F/u LP--initial results show no inflammation; non-inflammatory causes of transverse myelitis include spinal vascular infaract, metabolic/nutritional myelopathies; appreciate Neurology recs.   --PT consult this AM--initial recommendation for inpatient rehab  2. Depression --continue home sertraline 50mg  PO once daily  3. Osteopoarthritis --continue home mobic 15 mg PO once daily --vit D 1000 units PO once  daily  FEN/GI: Lytes normal. Regular diet.  --miralax for constipation;  --dulcolax x1  DVT PPX: d/c lovenox 3/9 for LP 3/10. SCDs in place.  Will restart lovenox post LP --restart lovenox 1mg /kg 3/11  Code: Full.  Dispo: Inpatient rehab likely; pending work-up and stabilization/improvement in neurological findings.    Eli C. Arlana Pouch MS4  Family Medicine Upper Level Addendum:   I have seen and examined the patient independently, discussed with Ellin Goodie MSIV, fully reviewed the progress note and agree with it's contents with the additions as noted in blue text. My independent exam is below.   S: states weakness slightly improved. Was able to get to the bedside toilet this AM from the chair but had trouble wiping so needed assist. Small BM with improvement in abdominal cramping. Feels like it is small though and would like to continue miralax and dulcolax x1 as previously ordered  O: BP 117/72  Pulse 80  Temp(Src) 98.3 F (36.8 C) (Oral)  Resp 17  SpO2 93% Gen: NAD, resting comfortably in chair, alert and oriented HEENT: MMM CV: RRR no mrg  Lungs: CTAB  Abd: soft/nontender/nondistended/normal bowel sounds  Ext: no edema  Skin: warm and dry, no rash  Neuro: lower extremities 4/5 with exception of 3/5 in hip flexors. UE 5/5 throughout left, right UE still with some weakness but grip appears 5/5 to me but 4/5 tricep and bicep after 2-3 seconds 5/5. Cannot stand. Clonus persists in bilateral feet.   A/P:   52 F with new onset lower extremity and RUE weakness likely due to transverse myelitis -appreciate neuro recs. Would appreciate input on further testing as to cause for transverse myelitis (e.g. RPR?) -continue to follow CSF cultures but initial testing does not appear to be infectious/inflammatory -changed dose of solumedrol per neuo recs. Patient does appear to have some improvement with steroids. Appreciate neuro recs as to prognosis but expect could take several months to tell  if she will have further resolution.  -currently being evaluated for CIR -constipation-continue miralax and dulcolax x1 -leukcocytosis due to demargination from steroids  Tana Conch, MD, PGY2 02/22/2013 1:02 PM

## 2013-02-22 NOTE — Progress Notes (Signed)
Rehab Admissions Coordinator Note:  Patient was screened by Trish Mage for appropriateness for an Inpatient Acute Rehab Consult. Noted PT and OT recommending CIR.  At this time, we are recommending Inpatient Rehab consult.  Trish Mage 02/22/2013, 9:18 AM  I can be reached at [phone].

## 2013-02-22 NOTE — Progress Notes (Signed)
FMTS Attending Note Patient seen and examined by me, sitting in chair.  Reports mild improvement in leg weakness, complains of abdominal cramping and difficulty with constipation (last BM on 3/09). Usually has BM daily.  Foley in place due to urinary retention.  Preliminary CSF cell count, glucose noted.  Exam: well appearing, no apparent distress. Sitting recliner.  Dorsi/plantarflexion strength 4-5/5 bilat symmetric. Handgrip symmetric and grossly full. Hyperreflexic patellar DTRs.  ABD Soft, nontender, no masses.  A/P: Evaluation of acute onset weakness without evidence of cord compression, concern for transverse myelitis.  Continuing on high dose steroids.  Will await further recs from Neurology service regarding workup and treatment/prognosis.  Patient is anxious to know the expected course of her illness. Consider bisacodyl or enema for constipation. Paula Compton, MD

## 2013-02-22 NOTE — Progress Notes (Signed)
Subjective: No changes noted in strength with weakness and numbness distally in upper extremities and both lower extremities. She had been experiencing spasms involving her legs which were uncomfortable but are now controlled with medications. She's had no problems with bowel control. She has a bladder catheter.  Patient says that one physician in [place] years ago told her that she may have had histoplasmosis in past by looking at her chest xray. She states that she has never been treated for this. She has traveled to [place] to visit parents for extended time period. Returned 3 weeks ago.  Objective: Current vital signs: BP 110/59  Pulse 78  Temp(Src) 99 F (37.2 C) (Oral)  Resp 18  SpO2 96%  Neurologic Exam: Alert and in no acute distress. Mental status was normal. Extraocular movements were full and conjugate. No facial weakness. Normal speech with no dysarthria Normal strength of upper extremities proximally; slight weakness of wrist extensors, right greater than left; mild intrinsic hand muscle weakness on the left and moderate on the right. Marked weakness of hip flexors with 2/5 strength bilaterally; quadriceps were normal; tibialis anterior and gastrocnemius muscles were normal. Diffuse hyperreflexia with 3+ reflexes in the upper extremities and at the knees; sustained clonus at right ankle; sustained clonus at left ankle.  Lab Results: Results for orders placed during the hospital encounter of 02/19/13 (from the past 48 hour(s))  CBC     Status: Abnormal   Collection Time    02/21/13  1:14 PM      Result Value Range   WBC 13.1 (*) 4.0 - 10.5 K/uL   RBC 4.07  3.87 - 5.11 MIL/uL   Hemoglobin 12.8  12.0 - 15.0 g/dL   HCT 81.1  91.4 - 78.2 %   MCV 90.2  78.0 - 100.0 fL   MCH 31.4  26.0 - 34.0 pg   MCHC 34.9  30.0 - 36.0 g/dL   RDW 95.6  21.3 - 08.6 %   Platelets 286  150 - 400 K/uL  PROTIME-INR     Status: None   Collection Time    02/21/13  1:14 PM      Result Value  Range   Prothrombin Time 13.7  11.6 - 15.2 seconds   INR 1.06  0.00 - 1.49  CSF CELL COUNT WITH DIFFERENTIAL     Status: Abnormal   Collection Time    02/21/13  4:08 PM      Result Value Range   Tube # 1     Color, CSF COLORLESS  COLORLESS   Appearance, CSF CLEAR  CLEAR   Supernatant NOT INDICATED     RBC Count, CSF 6 (*) 0 /cu mm   WBC, CSF 1  0 - 5 /cu mm   Segmented Neutrophils-CSF NONE SEEN  0 - 6 %   Lymphs, CSF FEW  40 - 80 %   Monocyte-Macrophage-Spinal Fluid RARE  15 - 45 %   Eosinophils, CSF NONE SEEN  0 - 1 %  CSF CULTURE     Status: None   Collection Time    02/21/13  4:09 PM      Result Value Range   Specimen Description CSF     Special Requests NONE     Gram Stain       Value: WBC PRESENT, PREDOMINANTLY MONONUCLEAR     NO ORGANISMS SEEN     CYTOSPUN Performed at [facility]   Culture NO GROWTH     Report Status PENDING  PROTEIN AND GLUCOSE, CSF     Status: Abnormal   Collection Time    02/21/13  4:10 PM      Result Value Range   Glucose, CSF 100 (*) 43 - 76 mg/dL   Total  Protein, CSF 32  15 - 45 mg/dL  GRAM STAIN     Status: None   Collection Time    02/21/13  4:10 PM      Result Value Range   Specimen Description CSF     Special Requests NONE     Gram Stain       Value: CYTOSPIN SLIDE     WBC PRESENT, PREDOMINANTLY MONONUCLEAR     NO ORGANISMS SEEN   Report Status 02/21/2013 FINAL    FUNGUS CULTURE W SMEAR     Status: None   Collection Time    02/21/13  4:11 PM      Result Value Range   Specimen Description CSF     Special Requests NONE     Fungal Smear NO YEAST OR FUNGAL ELEMENTS SEEN     Culture CULTURE IN PROGRESS FOR FOUR WEEKS     Report Status PENDING    CBC     Status: Abnormal   Collection Time    02/22/13  6:10 AM      Result Value Range   WBC 13.0 (*) 4.0 - 10.5 K/uL   RBC 4.02  3.87 - 5.11 MIL/uL   Hemoglobin 12.5  12.0 - 15.0 g/dL   HCT 45.4 (*) 09.8 - 11.9 %   MCV 89.1  78.0 - 100.0 fL   MCH 31.1  26.0 - 34.0 pg    MCHC 34.9  30.0 - 36.0 g/dL   RDW 14.7  82.9 - 56.2 %   Platelets 271  150 - 400 K/uL  BASIC METABOLIC PANEL     Status: Abnormal   Collection Time    02/22/13  6:10 AM      Result Value Range   Sodium 138  135 - 145 mEq/L   Potassium 3.9  3.5 - 5.1 mEq/L   Chloride 102  96 - 112 mEq/L   CO2 25  19 - 32 mEq/L   Glucose, Bld 137 (*) 70 - 99 mg/dL   BUN 21  6 - 23 mg/dL   Creatinine, Ser 1.30  0.50 - 1.10 mg/dL   Calcium 8.9  8.4 - 86.5 mg/dL   GFR calc non Af Amer >90  >90 mL/min   GFR calc Af Amer >90  >90 mL/min   Comment:            The eGFR has been calculated     using the CKD EPI equation.     This calculation has not been     validated in all clinical     situations.     eGFR's persistently     <90 mL/min signify     possible Chronic Kidney Disease.    Studies/Results: Mr Laqu[facility] Contrast  02/20/2013  *RADIOLOGY REPORT*  Clinical Data: Bilateral leg weakness.  Rule out demyelinating disease.  MRI HEAD WITHOUT AND WITH CONTRAST  Technique:  Multiplanar, multiecho pulse sequences of the brain and surrounding structures were obtained according to standard protocol without and with intravenous contrast  Contrast: 13mL MULTIHANCE GADOBENATE DIMEGLUMINE 529 MG/ML IV SOLN  Comparison: None.  Findings: Ventricle size is normal.  Craniocervical junction is normal.  Pituitary is not enlarged.  Scattered small subcortical white matter hyperintensities are  present on the right.  Left cerebral white matter is normal.  Basal ganglion brainstem and cerebellum are normal.  Diffusion weighted imaging is negative.  No acute infarct. Negative for hemorrhage or mass lesion.  No pathologic enhancement following contrast administration.  Chronic sinusitis with mucosal thickening in the paranasal sinuses.  IMPRESSION: Scattered small subcortical white matter hyperintensities in the cerebral white matter on the right.  These are most likely related to chronic microvascular ischemia.  Differential  includes migraine headache and vasculitis.  No acute infarct or mass.  Multiple sclerosis not felt likely.   Original Report Authenticated By: Janeece Riggers, M.D.     Medications:  I have reviewed the patient's current medications. Scheduled: . baclofen  5 mg Oral QID  . bisacodyl  5 mg Oral Once  . cholecalciferol  1,000 Units Oral q morning - 10a  . enoxaparin (LOVENOX) injection  40 mg Subcutaneous Q24H  . meloxicam  15 mg Oral Daily  . methylPREDNISolone (SOLU-MEDROL) injection  100 mg Intravenous Q12H  . methylPREDNISolone (SOLU-MEDROL) injection  500 mg Intravenous Once  . polyethylene glycol  17 g Oral Daily  . sertraline  50 mg Oral q morning - 10a  . sodium chloride  3 mL Intravenous Q12H   JYN:[id number], zolpidem  Assessment/Plan: Probable acute transverse myelitis as etiology for her distal upper extremity and bilateral lower extremity weakness and numbness. No evidence of compressive lesion involving spinal cord is demonstrated. MRI of the brain was unremarkable with no indication of an acute intracranial lesion.   Lumbar puncture was performed. Full report pending (fungal). Questionable history of histoplasmosis. Recommend CXR.  Recommend continuing IV Solu-Medrol 100 mg every 12 hours. Baclofen 5 mg ordered for spasticity which is helping. Physical therapy and occupational therapy working with patient and recommending inpatient rehabilitation.  Job Founds, MBA, MHA [facility] Pager [phone]  Reviewed and approved. Venetia Maxon M.D. [facility]  02/22/2013  7:02 PM

## 2013-02-23 ENCOUNTER — Inpatient Hospital Stay (HOSPITAL_COMMUNITY): Payer: Medicaid Other

## 2013-02-23 LAB — RPR: RPR Ser Ql: NONREACTIVE

## 2013-02-23 MED ORDER — METHYLPREDNISOLONE SODIUM SUCC 125 MG IJ SOLR
100.0000 mg | Freq: Every day | INTRAMUSCULAR | Status: DC
  Administered 2013-02-24: 100 mg via INTRAVENOUS
  Filled 2013-02-23: qty 1.6

## 2013-02-23 NOTE — Progress Notes (Addendum)
Subjective: Patient reports no improvement while on steroids.  Continues to have weakness and numbness.  Spasms improved with Baclofen.    Objective: Current vital signs: BP 120/72  Pulse 73  Temp(Src) 98.1 F (36.7 C) (Oral)  Resp 18  SpO2 95% Vital signs in last 24 hours: Temp:  [97.4 F (36.3 C)-99 F (37.2 C)] 98.1 F (36.7 C) (03/12 1610) Pulse Rate:  [73-80] 73 (03/12 0652) Resp:  [18] 18 (03/12 0652) BP: (110-120)/(55-72) 120/72 mmHg (03/12 0652) SpO2:  [95 %-97 %] 95 % (03/12 0652)  Intake/Output from previous day: 03/11 0701 - 03/12 0700 In: 580 [P.O.:580] Out: 1700 [Urine:1700] Intake/Output this shift: Total I/O In: 3 [I.V.:3] Out: -  Nutritional status: General  Neurologic Exam: Mental Status: Alert, oriented, thought content appropriate.  Speech fluent without evidence of aphasia.  Able to follow 3 step commands without difficulty. Cranial Nerves: II: Discs flat bilaterally; Visual fields grossly normal, pupils equal, round, reactive to light and accommodation III,IV, VI: ptosis not present, extra-ocular motions intact bilaterally V,VII: smile symmetric, facial light touch sensation normal bilaterally VIII: hearing normal bilaterally IX,X: gag reflex present XI: bilateral shoulder shrug XII: midline tongue extension Motor: Right : Upper extremity   5/5      Left:     Upper extremity   5/5  Lower extremity   3+/5 proximally     Lower extremity   3+/5 proximally Lower extremities are 5/5 distally Tone and bulk:normal tone throughout; no atrophy noted Sensory: Pinprick and light touch decreased in the lower extremities and in the ulnar aspect of the RUE Deep Tendon Reflexes: 3+ and symmetric throughout Plantars: Right: upgoing   Left: upgoing Cerebellar: normal finger-to-nose, normal rapid alternating movements and normal heel-to-shin test Gait: unable to be tested CV: pulses palpable throughout     Lab Results: Basic Metabolic Panel:  Recent  Labs Lab 02/20/13 0055 02/20/13 0713 02/20/13 1300 02/22/13 0610  NA 136  --   --  138  K 3.8  --   --  3.9  CL 101  --   --  102  CO2 25  --   --  25  GLUCOSE 124*  --   --  137*  BUN 16  --   --  21  CREATININE 0.58  --  0.52 0.53  CALCIUM 9.1  --   --  8.9  MG  --  2.1  --   --     Liver Function Tests: No results found for this basename: AST, ALT, ALKPHOS, BILITOT, PROT, ALBUMIN,  in the last 168 hours No results found for this basename: LIPASE, AMYLASE,  in the last 168 hours No results found for this basename: AMMONIA,  in the last 168 hours  CBC:  Recent Labs Lab 02/20/13 0055 02/20/13 1300 02/21/13 1314 02/22/13 0610  WBC 6.5 8.2 13.1* 13.0*  NEUTROABS 4.8  --   --   --   HGB 12.6 13.5 12.8 12.5  HCT 36.3 38.4 36.7 35.8*  MCV 89.9 89.7 90.2 89.1  PLT 287 300 286 271    Cardiac Enzymes: No results found for this basename: CKTOTAL, CKMB, CKMBINDEX, TROPONINI,  in the last 168 hours  Lipid Panel: No results found for this basename: CHOL, TRIG, HDL, CHOLHDL, VLDL, LDLCALC,  in the last 168 hours  CBG: No results found for this basename: GLUCAP,  in the last 168 hours  Microbiology: Results for orders placed during the hospital encounter of 02/19/13  CSF CULTURE  Status: None   Collection Time    02/21/13  4:09 PM      Result Value Range Status   Specimen Description CSF   Final   Special Requests NONE   Final   Gram Stain     Final   Value: WBC PRESENT, PREDOMINANTLY MONONUCLEAR     NO ORGANISMS SEEN     CYTOSPUN Performed at [facility]   Culture NO GROWTH 1 DAY   Final   Report Status PENDING   Incomplete  GRAM STAIN     Status: None   Collection Time    02/21/13  4:10 PM      Result Value Range Status   Specimen Description CSF   Final   Special Requests NONE   Final   Gram Stain     Final   Value: CYTOSPIN SLIDE     WBC PRESENT, PREDOMINANTLY MONONUCLEAR     NO ORGANISMS SEEN   Report Status 02/21/2013 FINAL   Final  FUNGUS  CULTURE W SMEAR     Status: None   Collection Time    02/21/13  4:11 PM      Result Value Range Status   Specimen Description CSF   Final   Special Requests NONE   Final   Fungal Smear NO YEAST OR FUNGAL ELEMENTS SEEN   Final   Culture CULTURE IN PROGRESS FOR FOUR WEEKS   Final   Report Status PENDING   Incomplete    Coagulation Studies:  Recent Labs  02/21/13 1314  LABPROT 13.7  INR 1.06    Imaging: No results found.  Medications:  I have reviewed the patient's current medications. Scheduled: . baclofen  5 mg Oral QID  . cholecalciferol  1,000 Units Oral q morning - 10a  . enoxaparin (LOVENOX) injection  40 mg Subcutaneous Q24H  . meloxicam  15 mg Oral Daily  . methylPREDNISolone (SOLU-MEDROL) injection  100 mg Intravenous Q12H  . methylPREDNISolone (SOLU-MEDROL) injection  500 mg Intravenous Once  . polyethylene glycol  17 g Oral Daily  . sertraline  50 mg Oral q morning - 10a  . sodium chloride  3 mL Intravenous Q12H    Assessment/Plan: 62 year old female with a rapid onset paraparesis-proximal weakness greater than distal weakness.  No bowel or bladder complaints.  MRI of the brain and spine show no etiology to explain exam findings.  LP shows no evidence of infection or inflammation.  Etiology for symptoms remains unclear.  Transverse myelitis unlikely.    Recommendations: 1.  TSH, B12, ANA, lyme titer, anti-GAD antibody, anti-GM1 antibody, heavy metal screen, copper, vitamin D 2.  Discontinue steroids.  3.  Repeat MRI of the cervical spine with contrast  4.  If above work up unremarkable will need to consider an EMG/NCV as an outpatient-unable to be performed as an inpatient    LOS: 4 days   Thana Farr, MD [facility] [phone] 02/23/2013  12:22 PM

## 2013-02-23 NOTE — Progress Notes (Signed)
Physical Therapy Treatment Patient Details Name: Gail Gilbert MRN: [id number] DOB: 1951/03/01 Today's Date: 02/23/2013 Time: 4098-1191 PT Time Calculation (min): 36 min  PT Assessment / Plan / Recommendation Comments on Treatment Session  Pt is 62 yo female with possible tranverse myelitis who ambulated a few feet today with manual knee control by therapist. She is discouraged by being unable to have a bowel mvmt despite much cramping. She began to feel weak on BSC, BP 66/37. Pt assisted back to chair, feet up, reclined, RN aware and notified physician. Continue to strongly feel that pt will be a good rehan candidate. PT will continue to follow.    Follow Up Recommendations  CIR     Does the patient have the potential to tolerate intense rehabilitation     Barriers to Discharge        Equipment Recommendations  Other (comment) (TBD)    Recommendations for Other Services Rehab consult  Frequency Min 4X/week   Plan Discharge plan remains appropriate;Frequency remains appropriate    Precautions / Restrictions Precautions Precautions: Fall Restrictions Weight Bearing Restrictions: No   Pertinent Vitals/Pain BP 66/37, RN aware, transferred back to chair with feet up    Mobility  Bed Mobility Bed Mobility: Rolling Left;Left Sidelying to Sit;Sitting - Scoot to [place] of Bed Rolling Left: 4: Min assist;With rail Left Sidelying to Sit: 4: Min assist;With rails;HOB flat Sitting - Scoot to [place] of Bed: 4: Min assist Details for Bed Mobility Assistance: pt needed assist to achieve full SL and asisst to help slide legs off front of bed Transfers Transfers: Sit to Stand;Stand to Sit Sit to Stand: 1: +2 Total assist;From bed;From chair/3-in-1 Sit to Stand: Patient Percentage: 50% Stand to Sit: 1: +2 Total assist;To chair/3-in-1;To bed;With upper extremity assist Stand to Sit: Patient Percentage: 50% Stand Pivot Transfers: 1: +2 Total assist Stand Pivot Transfers: Patient Percentage:  40% Details for Transfer Assistance: pt performed multiple sit to stand as well as stand-pivot and squat pivot transfers. Was able to stand without feet inverted but knees blocked to prevent buckling. When performing squat pivot, pt was unable to push through floor with feet to scoot hips back into chair, ineffective push with feet coming off floor Ambulation/Gait Ambulation/Gait Assistance: 1: +2 Total assist Ambulation/Gait: Patient Percentage: 40% Ambulation Distance (Feet): 2 Feet Assistive device: Rolling walker Ambulation/Gait Assistance Details: 1 person assist standing behind pt and 1 person assist in front of pt with hands on bilateral knees to provide knee control. Pt was able to take about 5 steps in this way. Difficulty keeping wt withing center of gravity, needed RW to help her orient Gait Pattern: Decreased stride length;Decreased weight shift to right;Decreased weight shift to left;Right steppage;Left steppage Stairs: No Wheelchair Mobility Wheelchair Mobility: No    Exercises General Exercises - Lower Extremity Long Arc Quad: AROM;Both;10 reps;Seated (with resistance)   PT Diagnosis:    PT Problem List:   PT Treatment Interventions:     PT Goals Acute Rehab PT Goals PT Goal Formulation: With patient Time For Goal Achievement: 03/01/13 Potential to Achieve Goals: Good Pt will go Supine/Side to Sit: with supervision;with HOB 0 degrees PT Goal: Supine/Side to Sit - Progress: Progressing toward goal Pt will go Sit to Stand: with min assist;with upper extremity assist PT Goal: Sit to Stand - Progress: Progressing toward goal Pt will Transfer Bed to Chair/Chair to Bed: with mod assist PT Transfer Goal: Bed to Chair/Chair to Bed - Progress: Progressing toward goal Pt will Stand: with  mod assist;1 - 2 min;with bilateral upper extremity support PT Goal: Stand - Progress: Progressing toward goal Pt will Ambulate: 1 - 15 feet;with mod assist;with rolling walker PT Goal:  Ambulate - Progress: Progressing toward goal Pt will Perform Home Exercise Program: Independently PT Goal: Perform Home Exercise Program - Progress: Progressing toward goal  Visit Information  Last PT Received On: 02/23/13 Assistance Needed: +2    Subjective Data  Subjective: pt feels that she could have a bowel mvmt Patient Stated Goal: return home and to normal functioning   Cognition  Cognition Overall Cognitive Status: Appears within functional limits for tasks assessed/performed Arousal/Alertness: Awake/alert Orientation Level: Oriented X4 / Intact Behavior During Session: [facility] for tasks performed    Balance  Balance Balance Assessed: Yes Static Sitting Balance Static Sitting - Balance Support: Feet supported;No upper extremity supported Static Sitting - Level of Assistance: 5: Stand by assistance Dynamic Standing Balance Dynamic Standing - Balance Support: Bilateral upper extremity supported;During functional activity Dynamic Standing - Level of Assistance: 1: +2 Total assist (pt 50%)  End of Session PT - End of Session Equipment Utilized During Treatment: Gait belt Activity Tolerance: Patient tolerated treatment well Patient left: in chair;with call bell/phone within reach Nurse Communication: Other (comment) (BP)   GP   Lyanne Co, PT  Acute Rehab Services  [phone]   Lyanne Co 02/23/2013, 4:17 PM

## 2013-02-23 NOTE — Progress Notes (Addendum)
Patient ID: Gail Gilbert, female   DOB: 12-01-1951, 62 y.o.   MRN: [id number] PGY-1 Daily Progress Note Family Medicine Teaching Service Gail Gilbert MS4 Service Pager: [phone]   Subjective: Patient laying in bed, pleasant, comfortable.  Feels there have been no changes in her weakness. Mild tingling in right hand, predominately 5th digit. One BM yesterday. Took laxative last night.  Muscle relaxant helping with spasticity. No pain. Appetite good, no SOB, visual changes, h/a, fevers/chills.  Additional history: recently divorced, tested for HPV which was positive but resolved. No other STI testing, but she is interested in an STI panel.   ROS: nothing reported.   Objective:  VITALS Temp:  [97.4 F (36.3 C)-99 F (37.2 C)] 98.1 F (36.7 C) (03/12 0652) Pulse Rate:  [73-80] 73 (03/12 0652) Resp:  [18] 18 (03/12 0652) BP: (110-120)/(55-72) 120/72 mmHg (03/12 0652) SpO2:  [95 %-97 %] 95 % (03/12 0652)  In/Out  Intake/Output Summary (Last 24 hours) at 02/23/13 0849 Last data filed at 02/23/13 0559  Gross per 24 hour  Intake    580 ml  Output   1700 ml  Net  -1120 ml    Physical Exam: Gen:  NAD, sitting up in chair.  HEENT: moist mucous membranes CV: Regular rate and rhythm, no murmurs rubs or gallops PULM: clear to auscultation bilaterally. No wheezes/rales/rhonchi ABD: soft/nontender/nondistended/normal bowel sounds EXT: No edema Skin: LP site clean and dry.  Neuro: Alert and oriented x3. LE strength 4/5 bilateral foot dorsi/plantar flexion, 4/5 bilateral leg extension/flexion, 3/5 bilateral hip extension/flexion, unable to stand. UE right 5/5 grip strength, 5/5 left.  Sensation intact to light touch bilaterally U/L E, reduced sensation to temperature in LE. Unable to assess gait. Sustained clonus bilaterally at ankles. Did not assess reflexes.   MEDS Scheduled Meds: . baclofen  5 mg Oral QID  . cholecalciferol  1,000 Units Oral q morning - 10a  . enoxaparin (LOVENOX) injection   40 mg Subcutaneous Q24H  . meloxicam  15 mg Oral Daily  . methylPREDNISolone (SOLU-MEDROL) injection  100 mg Intravenous Q12H  . methylPREDNISolone (SOLU-MEDROL) injection  500 mg Intravenous Once  . polyethylene glycol  17 g Oral Daily  . sertraline  50 mg Oral q morning - 10a  . sodium chloride  3 mL Intravenous Q12H   Continuous Infusions:  PRN Meds:.povidone-iodine, zolpidem  Labs and imaging:   CBC  Recent Labs Lab 02/20/13 1300 02/21/13 1314 02/22/13 0610  WBC 8.2 13.1* 13.0*  HGB 13.5 12.8 12.5  HCT 38.4 36.7 35.8*  PLT 300 286 271   BMET/CMET  Recent Labs Lab 02/20/13 0055 02/20/13 1300 02/22/13 0610  NA 136  --  138  K 3.8  --  3.9  CL 101  --  102  CO2 25  --  25  BUN 16  --  21  CREATININE 0.58 0.52 0.53  CALCIUM 9.1  --  8.9  GLUCOSE 124*  --  137*   No results found for this or any previous visit (from the past 24 hour(s)). No results found.      Assessment  62 yo F presents with acute onset of bilateral LE weakness and tingling and RUE weakness and tingling.     1. Bilateral LE and RUE weakness: acute onset. Weakness without atrophy and with hyperreflexia on exam concerning for UMN lesion. Initial possibilities include spinal cord compression, vasculitis, transverse myelitis, MS. MRI spinal cord showed no cord compression. Patient with C5-C6, L4-L5 and L5-S1 spondylosis on  MRI, likely chronic. No spinal cord lesions, masses or abscesses noted to account for physical exam finding.  MRI head shows possible chronic ischemic changes, possible vasculitis, MS unlikely.  --Neurology Dr. Roseanne Reno, following: started solumedrol 500mg  IV 3/9;  --change IV Solumedrol to 100 mg IV BID 3/11 per neurology recs. Will continue today and await neuro recs on transition to PO.  --leukocytosis 2/2 steroids --restart lovenox 1mg /kg 3/11 following LP --Baclofen 5mg  PO QID for spasticity 3/11 --F/u neuro exam.  --F/u LP--initial results show no inflammation;  non-inflammatory causes of transverse myelitis include spinal vascular infaract, metabolic/nutritional myelopathies; appreciate Neurology recs.   --PT/PM&R consult 3/11--recommendation for inpatient rehab when bed available, patient is amenable. Awaiting Rehab RN consult --STI panel: HIV, syphilis, G/C  2. Depression --continue home sertraline 50mg  PO once daily  3. Osteopoarthritis --continue home mobic 15 mg PO once daily --vit D 1000 units PO once daily  FEN/GI: Lytes normal. Regular diet.  --miralax for constipation;  --dulcolax x1 --D/C foley 3/12  DVT PPX: Lovenox ppx dose.  Code: Full.  Dispo: Inpatient rehab when bed available.    Eli C. Arlana Pouch MS4  Family Medicine Upper Level Addendum:   I have seen and examined the patient independently, discussed with Gail Gilbert MSIV, fully reviewed the progress note and agree with it's contents with the additions as noted in blue text. My independent exam is below.   S: states weakness stable from yesterday (no improvement) but that her spirits are better. She has been reading abuot Job in the Bible and states this has improved her perspective on her own illness.   O:  BP 120/72  Pulse 73  Temp(Src) 98.1 F (36.7 C) (Oral)  Resp 18  SpO2 95% Gen: NAD, resting comfortably in chair, alert and oriented HEENT: MMM CV: RRR no mrg  Lungs: CTAB  Abd: soft/nontender/nondistended/normal bowel sounds  Ext: no edema  Skin: warm and dry, no rash  Neuro: lower extremities 4/5 with exception of 3/5 in hip flexors (after 1-2 second period of 4/5). UE 5/5 throughout left, right UE still with some weakness but grip appears 5/5 to me but 4/5 tricep and bicep after 2-3 seconds 5/5. Cannot stand. Clonus persists in bilateral feet.   A/P:   39 F with new onset lower extremity and RUE weakness likely due to transverse myelitis -appreciate neuro recs.  -continue to follow CSF cultures but initial testing does not appear to be  infectious/inflammatory. Cx neg to date.  -currently being evaluated for CIR. No insurance so question of affordability  -d/c foley and 8 hour voiding trial. Post void residual if voids, if no urination in 8 hours may replace foley and repeat in AM.  -will get RPR/HIV/gonorrhea/chlamydia  Aldine Contes. Marti Sleigh, MD, PGY2 02/23/2013 11:52 AM  Addendum: Discussed with Dr. Lum Babe who agrees with following neurology recommendations of coming off steroids with uncertain diagnosis of transverse myelitis but requests a taper off of medication. Have ordered 100mg  solumedrol daily with plan to further reduce tomorrow. As primary team, we have witnessed improvement in strength from 3/5 to 4/5 in several areas and believe she has had some benefit from steroids so do not want to rapidly take her off.    Aldine Contes. Marti Sleigh, MD, PGY2 02/23/2013 4:34 PM

## 2013-02-23 NOTE — Progress Notes (Signed)
FMTS Attending Note Patient seen and examined by me, discussed with resident team and medical student Ellin Goodie, and I agree with the assessment and plan as documented in this progress note.  Patient reports some improvement in her strength this morning.  Was able to get up to recliner for awhile.  Is open to the inpatient rehab program. She states that she had lost her job and health insurance in 2012, was in the process of applying for jobs when she became ill and hospitalized.  Agree with plan of care, including STI testing, as per resident/medical student note. Paula Compton, MD

## 2013-02-23 NOTE — Progress Notes (Addendum)
Patient on bsc c/o of weakness and felt faint. bp 66/37 hr 62 97/ra, no urine, no bm, therapists put patient in recliner. Rechecked bp 106/66.hr 69, 96/ra. Physician notified. Orders received  -Gail Neu, RN 02/23/13/3:23  Spoke with RN. Likely vasovagal episode with attempts to defecate. RN reported no uop yet. Asked for bladder scan and if elevated >>200 to alert [place]. Also asked for repeat bladder scan if able to urinate. If does not urinate in 8 hours, bladder scan and replace foley overnight with plans for trial tomorrow.  Urinary retention or constipation potentially could cause autonomic dysreflexia although we do not have clear evidence of spinal lesion above T6.   We had discussed d/c telemetry earlier but will restart at this time given event. Possible d/c tele tomorrow if stable. May need to add more to help with BM in AM.   Aldine Contes. Marti Sleigh, MD, PGY2 02/23/2013 3:50 PM

## 2013-02-23 NOTE — Progress Notes (Signed)
Patient seen and examined by me on 02/22/2013, I agree with assessment and plan by resident team and have discussed with the resident team.  Paula Compton, MD

## 2013-02-23 NOTE — Progress Notes (Signed)
Note workup continues.  Following for possible CIR in future.  [phone]

## 2013-02-24 ENCOUNTER — Inpatient Hospital Stay (HOSPITAL_COMMUNITY): Payer: Medicaid Other

## 2013-02-24 LAB — ANA: Anti Nuclear Antibody(ANA): NEGATIVE

## 2013-02-24 LAB — GC/CHLAMYDIA PROBE AMP
CT Probe RNA: NEGATIVE
GC Probe RNA: NEGATIVE

## 2013-02-24 MED ORDER — FLEET ENEMA 7-19 GM/118ML RE ENEM
1.0000 | ENEMA | Freq: Once | RECTAL | Status: AC
  Administered 2013-02-24: 1 via RECTAL
  Filled 2013-02-24: qty 1

## 2013-02-24 MED ORDER — GADOBENATE DIMEGLUMINE 529 MG/ML IV SOLN
13.0000 mL | Freq: Once | INTRAVENOUS | Status: AC | PRN
  Administered 2013-02-24: 13 mL via INTRAVENOUS

## 2013-02-24 NOTE — Progress Notes (Signed)
PT Cancellation Note  Patient Details Name: Gail Gilbert MRN: [id number] DOB: Aug 08, 1951   Cancelled Treatment:    Reason Eval/Treat Not Completed: Patient at procedure or test/unavailable (MRI), will try to see this afternoon if time permits; otherwise will see tomorrow.   Fabio Asa 02/24/2013, 11:59 AM

## 2013-02-24 NOTE — Progress Notes (Addendum)
Patient ID: Gail Gilbert, female   DOB: 1951/05/07, 62 y.o.   MRN: [id number] PGY-1 Daily Progress Note Family Medicine Teaching Service Service Pager: [phone]   Subjective: Feels there have been no changes in her weakness. Mild tingling in right hand, predominately 5th digit.  Reports constipation this am and requesting enema.  Objective:  VITALS Temp:  [98.2 F (36.8 C)-98.4 F (36.9 C)] 98.4 F (36.9 C) (03/13 0625) Pulse Rate:  [62-85] 67 (03/13 0625) Resp:  [16-18] 16 (03/13 0625) BP: (66-135)/(37-73) 105/60 mmHg (03/13 0625) SpO2:  [95 %-98 %] 96 % (03/13 0625)  In/Out  Intake/Output Summary (Last 24 hours) at 02/24/13 0928 Last data filed at 02/24/13 0639  Gross per 24 hour  Intake   1233 ml  Output   4902 ml  Net  -3669 ml    Physical Exam: Gen:  Well appearing, NAD. CV: Regular rate and rhythm, no murmurs rubs or gallops PULM: clear to auscultation bilaterally. No wheezes/rales/rhonchi ABD: soft/nontender/nondistended EXT: No edema Neuro: Alert and oriented x3. LE strength 4/5 bilateral foot dorsi/plantar flexion, 4/5 bilateral hip extension/flexion UE right 5/5 grip strength, 4/5 left.  Sensation grossly intact. No clonus appreciated this am.  MEDS Scheduled Meds: . baclofen  5 mg Oral QID  . cholecalciferol  1,000 Units Oral q morning - 10a  . enoxaparin (LOVENOX) injection  40 mg Subcutaneous Q24H  . meloxicam  15 mg Oral Daily  . methylPREDNISolone (SOLU-MEDROL) injection  100 mg Intravenous Daily  . polyethylene glycol  17 g Oral Daily  . sertraline  50 mg Oral q morning - 10a  . sodium chloride  3 mL Intravenous Q12H  . sodium phosphate  1 enema Rectal Once   Continuous Infusions:  PRN Meds:.povidone-iodine, zolpidem  Labs and imaging:   CBC  Recent Labs Lab 02/20/13 1300 02/21/13 1314 02/22/13 0610  WBC 8.2 13.1* 13.0*  HGB 13.5 12.8 12.5  HCT 38.4 36.7 35.8*  PLT 300 286 271   BMET/CMET  Recent Labs Lab 02/20/13 0055  02/20/13 1300 02/22/13 0610  NA 136  --  138  K 3.8  --  3.9  CL 101  --  102  CO2 25  --  25  BUN 16  --  21  CREATININE 0.58 0.52 0.53  CALCIUM 9.1  --  8.9  GLUCOSE 124*  --  137*   Results for orders placed during the hospital encounter of 02/19/13 (from the past 24 hour(s))  HIV ANTIBODY (ROUTINE TESTING)     Status: None   Collection Time    02/23/13 11:25 AM      Result Value Range   HIV NON REACTIVE  NON REACTIVE  RPR     Status: None   Collection Time    02/23/13 11:25 AM      Result Value Range   RPR NON REACTIVE  NON REACTIVE  CERULOPLASMIN     Status: None   Collection Time    02/23/13  3:00 PM      Result Value Range   Ceruloplasmin 22  20 - 60 mg/dL  TSH     Status: None   Collection Time    02/23/13  3:00 PM      Result Value Range   TSH 1.001  0.350 - 4.500 uIU/mL  VITAMIN B12     Status: None   Collection Time    02/23/13  3:00 PM      Result Value Range   Vitamin B-12 765  211 - 911 pg/mL   Dg Chest Port 1 View  02/23/2013  *RADIOLOGY REPORT*  Clinical Data: Heart palpitations.  History of histoplasmosis.  PORTABLE CHEST - 1 VIEW  Comparison: None.  Findings: Apical lordotic portable view.  S-shaped thoracolumbar spine curvature mildly distorts appearance of the mediastinum. Normal heart size.  No pleural effusion or pneumothorax.  No congestive failure.  Calcified granuloma projecting over the right midlung zone and measures 6 mm.  The left lung is clear.  IMPRESSION: No acute cardiopulmonary disease.   Original Report Authenticated By: Jeronimo Greaves, M.D.    Assessment  62 yo F presents with acute onset of bilateral LE weakness and tingling and RUE weakness and tingling.     1. Bilateral LE and RUE weakness: acute onset. Weakness without atrophy and with hyperreflexia on exam concerning for UMN lesion. Initial possibilities include spinal cord compression, vasculitis, transverse myelitis, MS. MRI spinal cord showed no cord compression. Patient with  C5-C6, L4-L5 and L5-S1 spondylosis on MRI, likely chronic. No spinal cord lesions, masses or abscesses noted to account for physical exam finding.  MRI head shows possible chronic ischemic changes, possible vasculitis, MS unlikely.  --Neurology following: started solumedrol 500mg  IV 3/9; Steroids now discontinued after speaking with Dr. Thad Ranger. --Baclofen 5mg  PO QID for spasticity 3/11 --LP--initial results show no inflammation; non-inflammatory causes of transverse myelitis include spinal vascular infaract, metabolic/nutritional myelopathies; appreciate Neurology recs.   -- Work up continuing: TSH (1.001), B12 (765 norm), ANA (negative), lyme titer (negative), anti-GAD antibody (pending), Ceruloplasmin (normal), GAD (pending) --PT/PM&R consult 3/11--recommendation for inpatient rehab when bed available, patient is amenable. Awaiting Rehab RN consult --STI panel: HIV - negative. GC/Chlamydia in process  2. Depression --continue home sertraline 50mg  PO once daily  3. Osteopoarthritis --continue home mobic 15 mg PO once daily --vit D 1000 units PO once daily  FEN/GI: Regular diet.  DVT PPX: Lovenox ppx dose. Code: Full.  Dispo: Inpatient rehab when bed available.   FMTS Attending Note Patient seen and examined by me, discussed with Dr Adriana Simas and I agree with his assessment and plan. Patient is relatively unchanged from yesterday.  Had repeat MRI done earlier today, awaiting results.  Thus far, her labwork from yesterday is unremarkable.  Appreciate Neurology consult and workup.  Continue current therapy and will look for direction as to patient's appropriateness for CIR and the timing of transition out of the acute-care setting. Paula Compton, MD

## 2013-02-24 NOTE — Progress Notes (Signed)
Subjective: Patient reports no further improvement. HIV negative, RPR nonreactive, B12 normal,  TSH normal, copper normal.  Solumedrol restarted.    Objective: Current vital signs: BP 105/60  Pulse 67  Temp(Src) 98.4 F (36.9 C) (Oral)  Resp 16  Ht 5\' 8"  (1.727 m)  Wt 61.689 kg (136 lb)  BMI 20.68 kg/m2  SpO2 96% Vital signs in last 24 hours: Temp:  [98.2 F (36.8 C)-98.4 F (36.9 C)] 98.4 F (36.9 C) (03/13 0625) Pulse Rate:  [62-85] 67 (03/13 0625) Resp:  [16-18] 16 (03/13 0625) BP: (66-135)/(37-73) 105/60 mmHg (03/13 0625) SpO2:  [95 %-98 %] 96 % (03/13 0625)  Intake/Output from previous day: 03/12 0701 - 03/13 0700 In: 1473 [P.O.:1470; I.V.:3] Out: 4902 [Urine:4902] Intake/Output this shift:   Nutritional status: General  Neurologic Exam: Mental Status:  Alert, oriented, thought content appropriate. Speech fluent without evidence of aphasia. Able to follow 3 step commands without difficulty.  Cranial Nerves:  II: Discs flat bilaterally; Visual fields grossly normal, pupils equal, round, reactive to light and accommodation  III,IV, VI: ptosis not present, extra-ocular motions intact bilaterally  V,VII: smile symmetric, facial light touch sensation normal bilaterally  VIII: hearing normal bilaterally  IX,X: gag reflex present  XI: bilateral shoulder shrug  XII: midline tongue extension  Motor:  Right : Upper extremity 5/5           Left: Upper extremity 5/5   Lower extremity 3+/5 proximally      Lower extremity 3+/5 proximally  Lower extremities are 5/5 distally  Tone and bulk:normal tone throughout; no atrophy noted  Sensory: Pinprick and light touch decreased in the lower extremities and in the ulnar aspect of the RUE  Deep Tendon Reflexes: 3+ and symmetric throughout  Plantars:  Right: upgoing Left: upgoing  Cerebellar:  normal finger-to-nose, normal rapid alternating movements and normal heel-to-shin test  Gait: unable to be tested  CV: pulses palpable  throughout      Lab Results: Basic Metabolic Panel:  Recent Labs Lab 02/20/13 0055 02/20/13 0713 02/20/13 1300 02/22/13 0610  NA 136  --   --  138  K 3.8  --   --  3.9  CL 101  --   --  102  CO2 25  --   --  25  GLUCOSE 124*  --   --  137*  BUN 16  --   --  21  CREATININE 0.58  --  0.52 0.53  CALCIUM 9.1  --   --  8.9  MG  --  2.1  --   --     Liver Function Tests: No results found for this basename: AST, ALT, ALKPHOS, BILITOT, PROT, ALBUMIN,  in the last 168 hours No results found for this basename: LIPASE, AMYLASE,  in the last 168 hours No results found for this basename: AMMONIA,  in the last 168 hours  CBC:  Recent Labs Lab 02/20/13 0055 02/20/13 1300 02/21/13 1314 02/22/13 0610  WBC 6.5 8.2 13.1* 13.0*  NEUTROABS 4.8  --   --   --   HGB 12.6 13.5 12.8 12.5  HCT 36.3 38.4 36.7 35.8*  MCV 89.9 89.7 90.2 89.1  PLT 287 300 286 271    Cardiac Enzymes: No results found for this basename: CKTOTAL, CKMB, CKMBINDEX, TROPONINI,  in the last 168 hours  Lipid Panel: No results found for this basename: CHOL, TRIG, HDL, CHOLHDL, VLDL, LDLCALC,  in the last 168 hours  CBG: No results found for this basename:  GLUCAP,  in the last 168 hours  Microbiology: Results for orders placed during the hospital encounter of 02/19/13  CSF CULTURE     Status: None   Collection Time    02/21/13  4:09 PM      Result Value Range Status   Specimen Description CSF   Final   Special Requests NONE   Final   Gram Stain     Final   Value: WBC PRESENT, PREDOMINANTLY MONONUCLEAR     NO ORGANISMS SEEN     CYTOSPUN Performed at [facility]   Culture NO GROWTH 2 DAYS   Final   Report Status PENDING   Incomplete  GRAM STAIN     Status: None   Collection Time    02/21/13  4:10 PM      Result Value Range Status   Specimen Description CSF   Final   Special Requests NONE   Final   Gram Stain     Final   Value: CYTOSPIN SLIDE     WBC PRESENT, PREDOMINANTLY MONONUCLEAR     NO  ORGANISMS SEEN   Report Status 02/21/2013 FINAL   Final  FUNGUS CULTURE W SMEAR     Status: None   Collection Time    02/21/13  4:11 PM      Result Value Range Status   Specimen Description CSF   Final   Special Requests NONE   Final   Fungal Smear NO YEAST OR FUNGAL ELEMENTS SEEN   Final   Culture CULTURE IN PROGRESS FOR FOUR WEEKS   Final   Report Status PENDING   Incomplete    Coagulation Studies:  Recent Labs  02/21/13 1314  LABPROT 13.7  INR 1.06    Imaging: Dg Chest Port 1 View  02/23/2013  *RADIOLOGY REPORT*  Clinical Data: Heart palpitations.  History of histoplasmosis.  PORTABLE CHEST - 1 VIEW  Comparison: None.  Findings: Apical lordotic portable view.  S-shaped thoracolumbar spine curvature mildly distorts appearance of the mediastinum. Normal heart size.  No pleural effusion or pneumothorax.  No congestive failure.  Calcified granuloma projecting over the right midlung zone and measures 6 mm.  The left lung is clear.  IMPRESSION: No acute cardiopulmonary disease.   Original Report Authenticated By: Jeronimo Greaves, M.D.     Medications:  I have reviewed the patient's current medications. Scheduled: . baclofen  5 mg Oral QID  . cholecalciferol  1,000 Units Oral q morning - 10a  . enoxaparin (LOVENOX) injection  40 mg Subcutaneous Q24H  . meloxicam  15 mg Oral Daily  . methylPREDNISolone (SOLU-MEDROL) injection  100 mg Intravenous Daily  . polyethylene glycol  17 g Oral Daily  . sertraline  50 mg Oral q morning - 10a  . sodium chloride  3 mL Intravenous Q12H  . sodium phosphate  1 enema Rectal Once    Assessment/Plan: Patient unchanged.  Lab work that has returned is unremarkable.  Further tests pending.  MRI pending.  Recommendations: 1.  Will follow up results of pending tests 2.  Continue therapy    LOS: 5 days   Thana Farr, MD [facility] [phone] 02/24/2013  11:35 AM

## 2013-02-25 ENCOUNTER — Inpatient Hospital Stay (HOSPITAL_COMMUNITY): Payer: Medicaid Other

## 2013-02-25 LAB — CREATININE, SERUM: Creatinine, Ser: 0.61 mg/dL (ref 0.50–1.10)

## 2013-02-25 LAB — CSF CULTURE W GRAM STAIN: Culture: NO GROWTH

## 2013-02-25 MED ORDER — GADOBENATE DIMEGLUMINE 529 MG/ML IV SOLN
10.0000 mL | Freq: Once | INTRAVENOUS | Status: AC | PRN
  Administered 2013-02-25: 10 mL via INTRAVENOUS

## 2013-02-25 MED ORDER — LORAZEPAM 2 MG/ML IJ SOLN
1.0000 mg | Freq: Once | INTRAMUSCULAR | Status: AC
  Administered 2013-02-25: 1 mg via INTRAVENOUS
  Filled 2013-02-25: qty 1

## 2013-02-25 NOTE — Progress Notes (Signed)
FMTS Attending Note Patient seen and examined by me, discussed with resident team and medical student Ellin Goodie and I agree with assessment and plan in the note.  Patient reports little change.  Appreciate Neurology input.  Patient remains with foley catheter due to urinary retention (failed trial of void 2 days ago).   For MRI L-spine today.  For Physical Therapy as well while workup ongoing.  Paula Compton, MD

## 2013-02-25 NOTE — Progress Notes (Signed)
Physical Therapy Treatment Patient Details Name: Gail Gilbert MRN: [id number] DOB: Feb 18, 1951 Today's Date: 02/25/2013 Time: 5621-3086 PT Time Calculation (min): 24 min  PT Assessment / Plan / Recommendation Comments on Treatment Session  Pt with some improvements in mobility today.  Patient with max multimodal cues to help with muscle setting and advancement of bilateral LE.  Patient educated on importance of mucle strengthening and activity.  Pt tolerated ther-ex well.  Will continue to see and progress acitivity as tolerated. Feel patient is an ideal candidate for CIR at this time.    Follow Up Recommendations  CIR     Does the patient have the potential to tolerate intense rehabilitation     Barriers to Discharge        Equipment Recommendations  Other (comment) (TBD)    Recommendations for Other Services Rehab consult  Frequency Min 4X/week   Plan Discharge plan remains appropriate;Frequency remains appropriate    Precautions / Restrictions Precautions Precautions: Fall Restrictions Weight Bearing Restrictions: No   Pertinent Vitals/Pain No pain    Mobility  Bed Mobility Bed Mobility: Rolling Left;Left Sidelying to Sit;Sitting - Scoot to Edge of Bed Rolling Left: 4: Min assist;With rail Left Sidelying to Sit: 4: Min assist;With rails;HOB flat Sitting - Scoot to [place] of Bed: 4: Min assist Details for Bed Mobility Assistance: VC's for positioning Transfers Transfers: Sit to Stand;Stand to Sit Sit to Stand: 3: Mod assist;From bed;From chair/3-in-1 Stand to Sit: 3: Mod assist Details for Transfer Assistance: Pt able to perform with max VC's for Quad setting and mod assist for support Ambulation/Gait Ambulation/Gait Assistance: 1: +2 Total assist Ambulation/Gait: Patient Percentage: 70% Ambulation Distance (Feet): 16 Feet (16 ft x 2 trials) Assistive device: Rolling walker Ambulation/Gait Assistance Details: Max VC's for quad setting and weight loading with  multimodal cues.  2 person assist for LE blocking during ambulation to prevent buckling Gait Pattern: Decreased stride length;Decreased weight shift to right;Decreased weight shift to left;Right steppage;Left steppage Gait velocity: decreased Stairs: No Wheelchair Mobility Wheelchair Mobility: No    Exercises General Exercises - Lower Extremity Ankle Circles/Pumps: AROM;Both;10 reps;Seated Quad Sets: AROM;Both;10 reps;Seated Long Arc Quad: AROM;Both;10 reps;Seated (with resistance) Hip ABduction/ADduction: AROM;Both;10 reps;Seated     PT Goals Acute Rehab PT Goals PT Goal Formulation: With patient Time For Goal Achievement: 03/01/13 Potential to Achieve Goals: Good Pt will go Supine/Side to Sit: with supervision;with HOB 0 degrees PT Goal: Supine/Side to Sit - Progress: Progressing toward goal Pt will go Sit to Stand: with min assist;with upper extremity assist PT Goal: Sit to Stand - Progress: Progressing toward goal Pt will Transfer Bed to Chair/Chair to Bed: with modified independence PT Transfer Goal: Bed to Chair/Chair to Bed - Progress: Progressing toward goal Pt will Stand: with mod assist;1 - 2 min;with bilateral upper extremity support PT Goal: Stand - Progress: Progressing toward goal Pt will Ambulate: 1 - 15 feet;with mod assist;with rolling walker PT Goal: Ambulate - Progress: Progressing toward goal Pt will Perform Home Exercise Program: Independently PT Goal: Perform Home Exercise Program - Progress: Progressing toward goal  Visit Information  Last PT Received On: 02/25/13 Assistance Needed: +2    Subjective Data  Subjective: I really want to get cleaned up Patient Stated Goal: return home and to normal functioning   Cognition  Cognition Overall Cognitive Status: Appears within functional limits for tasks assessed/performed Arousal/Alertness: Awake/alert Orientation Level: Oriented X4 / Intact Behavior During Session: [facility] for tasks performed    Balance   Balance Balance  Assessed: Yes Dynamic Standing Balance Dynamic Standing - Level of Assistance:  (pt 50%)  End of Session PT - End of Session Equipment Utilized During Treatment: Gait belt Activity Tolerance: Patient tolerated treatment well Patient left: in chair;with call bell/phone within reach Nurse Communication: Other (comment) (BP)   GP     Fabio Asa 02/25/2013, 3:23 PM Charlotte Crumb, PT DPT  [phone]

## 2013-02-25 NOTE — Progress Notes (Addendum)
Continuing to follow pt for possible CIR admit in future-await results of workup before decision can be made. Discussed this  w/ pt & w/ med student.  Also, contacted financial counselor [left vm] about pt wanting to discuss financial concerns. Gave pt written info about the Rehab program & some initial info about self pay for CIR.  CIR Admissions Coordinator, Ottie Glazier [phone] will f/up on Monday.

## 2013-02-25 NOTE — Progress Notes (Signed)
Patient ID: Gail Gilbert, female   DOB: Oct 04, 1951, 62 y.o.   MRN: [id number]  PGY-1 Daily Progress Note Family Medicine Teaching Service Service Pager: [phone]   Subjective: Feels there have been no changes in her weakness. Mild tingling in right hand, predominately 5th digit.  Constipation relieved. Would like shower w/ PT help today.  Looking forward to more activity and sitting up in chair today.   Additional history: reports neck pain week prior to acute events from more activity lifting heavy objects, riding mountain bike.   Objective:  VITALS Temp:  [98.3 F (36.8 C)-98.7 F (37.1 C)] 98.4 F (36.9 C) (03/14 0510) Pulse Rate:  [74-90] 75 (03/14 0510) Resp:  [18] 18 (03/14 0510) BP: (106-111)/(55-64) 106/64 mmHg (03/14 0510) SpO2:  [97 %-98 %] 98 % (03/14 0510)  In/Out  Intake/Output Summary (Last 24 hours) at 02/25/13 0855 Last data filed at 02/25/13 0600  Gross per 24 hour  Intake      0 ml  Output   4501 ml  Net  -4501 ml    Physical Exam: Gen:  Well appearing, NAD. CV: Regular rate and rhythm, no murmurs rubs or gallops PULM: clear to auscultation bilaterally. No wheezes/rales/rhonchi ABD: soft/nontender/nondistended EXT: No edema Neuro: Alert and oriented x3. LE strength 4/5 bilateral foot dorsi/plantar flexion, 4/5 bilateral hip extension/flexion UE right 5/5 grip strength, 4/5 left.  Sensation grossly intact.  Reflexes: 3+ bilateral brachialis; 3+ right L patellar, 2+ left L patellar Clonus bilateral ankles.   MEDS Scheduled Meds: . baclofen  5 mg Oral QID  . cholecalciferol  1,000 Units Oral q morning - 10a  . enoxaparin (LOVENOX) injection  40 mg Subcutaneous Q24H  . meloxicam  15 mg Oral Daily  . polyethylene glycol  17 g Oral Daily  . sertraline  50 mg Oral q morning - 10a  . sodium chloride  3 mL Intravenous Q12H   Continuous Infusions:  PRN Meds:.povidone-iodine, zolpidem  Labs and imaging:   CBC  Recent Labs Lab 02/20/13 1300  02/21/13 1314 02/22/13 0610  WBC 8.2 13.1* 13.0*  HGB 13.5 12.8 12.5  HCT 38.4 36.7 35.8*  PLT 300 286 271   BMET/CMET  Recent Labs Lab 02/20/13 0055 02/20/13 1300 02/22/13 0610  NA 136  --  138  K 3.8  --  3.9  CL 101  --  102  CO2 25  --  25  BUN 16  --  21  CREATININE 0.58 0.52 0.53  CALCIUM 9.1  --  8.9  GLUCOSE 124*  --  137*   No results found for this or any previous visit (from the past 24 hour(s)). Mr Cervical Spine W Wo Contrast  02/24/2013  *RADIOLOGY REPORT*  Clinical Data: Bilateral lower extremity paraparesis.  MRI CERVICAL SPINE WITHOUT AND WITH CONTRAST  Technique:  Multiplanar and multiecho pulse sequences of the cervical spine, to include the craniocervical junction and cervicothoracic junction, were obtained according to standard protocol without and with intravenous contrast.  Contrast:  15 ml Multihance  Comparison: Cervical MRI dated 02/20/2013  Findings: Since the prior exam the patient has developed abnormal signal in the posterior lateral columns of the spinal cord at the C3, C4 and C5 levels.  The lateral column involvement is more prominent on the left. There is no enhancement of this area after contrast administration.  There is been no other change since the prior exam.  The patient has multilevel degenerative disc and joint disease, stable.  No spinal cord compression.  The posterior spinal artery as well seen and is patent.  Vertebral arteries are patent.  The pattern is consistent with subacute combined degeneration caused by the vitamin B12 deficiency.  This pattern can also be seen with HIV associated vascular myelopathy.  However, the patient is HIV negative and her B12 level is normal.  Chronic nitrous oxide exposure with normal B12 can cause this pattern.  Focal venous infarction should be considered. The pattern is not typical for transverse myelitis.  IMPRESSION: New evidence of myelopathy in the posterior columns and lateral columns at C3-C5 as  described.   Original Report Authenticated By: Francene Boyers, M.D.    Dg Chest Port 1 View  02/23/2013  *RADIOLOGY REPORT*  Clinical Data: Heart palpitations.  History of histoplasmosis.  PORTABLE CHEST - 1 VIEW  Comparison: None.  Findings: Apical lordotic portable view.  S-shaped thoracolumbar spine curvature mildly distorts appearance of the mediastinum. Normal heart size.  No pleural effusion or pneumothorax.  No congestive failure.  Calcified granuloma projecting over the right midlung zone and measures 6 mm.  The left lung is clear.  IMPRESSION: No acute cardiopulmonary disease.   Original Report Authenticated By: Jeronimo Greaves, M.D.    Assessment  62 yo F presents with acute onset of bilateral LE weakness and tingling and RUE weakness and tingling with urinary retention.      1. Bilateral LE and RUE weakness: acute onset. Weakness without atrophy and with hyperreflexia on exam concerning for UMN lesion. Initial possibilities include spinal cord compression, vasculitis, transverse myelitis, MS. Initial MRI spinal cord showed no cord compression. Patient with C5-C6, L4-L5 and L5-S1 spondylosis on MRI, likely chronic. No spinal cord lesions, masses or abscesses noted to account for physical exam finding.  MRI head shows possible chronic ischemic changes, possible vasculitis, MS unlikely. MRI Cervical spine w/ contrast 3/13 shows cervical myelopathy.   --Neurology following: started solumedrol 500mg  IV 3/9; Steroids now discontinued after speaking with Dr. Thad Ranger. --Baclofen 5mg  PO QID for spasticity 3/11 --LP--initial results show no inflammation; non-inflammatory causes of transverse myelitis include spinal vascular infaract, metabolic/nutritional myelopathies; appreciate Neurology recs.   -- Work up continuing: TSH (1.001), B12 (765 norm), ANA (negative), lyme titer (negative), anti-GAD antibody (pending), Ceruloplasmin (normal), GAD (pending);  --PT/PM&R consult 3/11--recommendation for inpatient  rehab when bed available, patient is amenable. Awaiting Rehab RN consult --STI panel negative: HIV, RPR, G/C.  --Per neuro recs, MRI lumbar spine today  2. Depression --continue home sertraline 50mg  PO once daily  3. Osteopoarthritis --continue home mobic 15 mg PO once daily --vit D 1000 units PO once daily  FEN/GI: Regular diet. Foley in place.  DVT PPX: Lovenox ppx dose. D/C SCDs Code: Full.  Dispo: Inpatient rehab when bed available.   Ellin Goodie, MS4  PGY-2 addendum: Pt seen and evaluated, Agree with MS4 note above. In brief, 62 yo f with acute onset lower extremity weakness and urinary retention, currently undergoing evaluation for cause. Pt doing well this morning, overall unchanged.   O:  Gen: sitting up in bed, talking with Dr. Thad Ranger. Appears happy and talkative.  HEENT: AT, [place] Neuro: Sitting up, not formally examined.  A/P: - Awaiting CIR evaluation, continue PT/OT - Appreciate Dr. Thad Ranger' input on the care of this complex patient. - Will get MRI of lumbar spine. Follow up results - Continue foley for now for urinary retention - Monitor neuro status  Amber M. Hairford, M.D. 02/25/2013 9:54 AM

## 2013-02-25 NOTE — Progress Notes (Signed)
Subjective: Patient reports that she has noted no improvement in her strength.  MRI of the cervical spine shows evidence of myelopathy in the lateral columns at C3-5.  Etiology is still unclear.    Objective: Current vital signs: BP 105/50  Pulse 86  Temp(Src) 98.2 F (36.8 C) (Oral)  Resp 18  Ht 5\' 8"  (1.727 m)  Wt 61.689 kg (136 lb)  BMI 20.68 kg/m2  SpO2 97% Vital signs in last 24 hours: Temp:  [98.2 F (36.8 C)-98.7 F (37.1 C)] 98.2 F (36.8 C) (03/14 0945) Pulse Rate:  [74-90] 86 (03/14 0945) Resp:  [18] 18 (03/14 0945) BP: (105-111)/(50-64) 105/50 mmHg (03/14 0945) SpO2:  [97 %-98 %] 97 % (03/14 0945)  Intake/Output from previous day: 03/13 0701 - 03/14 0700 In: 0  Out: 4501 [Urine:4500; Stool:1] Intake/Output this shift: Total I/O In: 240 [P.O.:240] Out: 1000 [Urine:1000] Nutritional status: General  Neurologic Exam: Mental Status:  Alert, oriented, thought content appropriate. Speech fluent without evidence of aphasia. Able to follow 3 step commands without difficulty.  Cranial Nerves:  II: Discs flat bilaterally; Visual fields grossly normal, pupils equal, round, reactive to light and accommodation  III,IV, VI: ptosis not present, extra-ocular motions intact bilaterally  V,VII: smile symmetric, facial light touch sensation normal bilaterally  VIII: hearing normal bilaterally  IX,X: gag reflex present  XI: bilateral shoulder shrug  XII: midline tongue extension  Motor:  Right : Upper extremity 5/5          Left: Upper extremity 5/5   Lower extremity 3+/5 proximally     Lower extremity 3+/5 proximally  Lower extremities are 5/5 distally  Tone and bulk:normal tone throughout; no atrophy noted  Sensory: Pinprick and light touch decreased in the lower extremities and in the ulnar aspect of the RUE  Deep Tendon Reflexes: 3+ and symmetric throughout  Plantars:  Right: upgoing    Left: upgoing  Cerebellar:  normal finger-to-nose, normal rapid alternating  movements and normal heel-to-shin test  Gait: unable to be tested  CV: pulses palpable throughout    Lab Results: Basic Metabolic Panel:  Recent Labs Lab 02/20/13 0055 02/20/13 0713 02/20/13 1300 02/22/13 0610  NA 136  --   --  138  K 3.8  --   --  3.9  CL 101  --   --  102  CO2 25  --   --  25  GLUCOSE 124*  --   --  137*  BUN 16  --   --  21  CREATININE 0.58  --  0.52 0.53  CALCIUM 9.1  --   --  8.9  MG  --  2.1  --   --     Liver Function Tests: No results found for this basename: AST, ALT, ALKPHOS, BILITOT, PROT, ALBUMIN,  in the last 168 hours No results found for this basename: LIPASE, AMYLASE,  in the last 168 hours No results found for this basename: AMMONIA,  in the last 168 hours  CBC:  Recent Labs Lab 02/20/13 0055 02/20/13 1300 02/21/13 1314 02/22/13 0610  WBC 6.5 8.2 13.1* 13.0*  NEUTROABS 4.8  --   --   --   HGB 12.6 13.5 12.8 12.5  HCT 36.3 38.4 36.7 35.8*  MCV 89.9 89.7 90.2 89.1  PLT 287 300 286 271    Cardiac Enzymes: No results found for this basename: CKTOTAL, CKMB, CKMBINDEX, TROPONINI,  in the last 168 hours  Lipid Panel: No results found for this basename: CHOL, TRIG,  HDL, CHOLHDL, VLDL, LDLCALC,  in the last 168 hours  CBG: No results found for this basename: GLUCAP,  in the last 168 hours  Microbiology: Results for orders placed during the hospital encounter of 02/19/13  CSF CULTURE     Status: None   Collection Time    02/21/13  4:09 PM      Result Value Range Status   Specimen Description CSF   Final   Special Requests NONE   Final   Gram Stain     Final   Value: WBC PRESENT, PREDOMINANTLY MONONUCLEAR     NO ORGANISMS SEEN     CYTOSPUN Performed at [facility]   Culture NO GROWTH 3 DAYS   Final   Report Status 02/25/2013 FINAL   Final  GRAM STAIN     Status: None   Collection Time    02/21/13  4:10 PM      Result Value Range Status   Specimen Description CSF   Final   Special Requests NONE   Final   Gram  Stain     Final   Value: CYTOSPIN SLIDE     WBC PRESENT, PREDOMINANTLY MONONUCLEAR     NO ORGANISMS SEEN   Report Status 02/21/2013 FINAL   Final  FUNGUS CULTURE W SMEAR     Status: None   Collection Time    02/21/13  4:11 PM      Result Value Range Status   Specimen Description CSF   Final   Special Requests NONE   Final   Fungal Smear NO YEAST OR FUNGAL ELEMENTS SEEN   Final   Culture CULTURE IN PROGRESS FOR FOUR WEEKS   Final   Report Status PENDING   Incomplete  GC/CHLAMYDIA PROBE AMP     Status: None   Collection Time    02/23/13  8:39 PM      Result Value Range Status   CT Probe RNA NEGATIVE  NEGATIVE Final   GC Probe RNA NEGATIVE  NEGATIVE Final   Comment: (NOTE)                                                                                              Normal Reference Range: Negative          Assay performed using the Gen-Probe APTIMA COMBO2 (R) Assay.     Acceptable specimen types for this assay include APTIMA Swabs (Unisex,     endocervical, urethral, or vaginal), first void urine, and ThinPrep     liquid based cytology samples.    Coagulation Studies: No results found for this basename: LABPROT, INR,  in the last 72 hours  Imaging: Mr Cervical Spine W Wo Contrast  02/24/2013  *RADIOLOGY REPORT*  Clinical Data: Bilateral lower extremity paraparesis.  MRI CERVICAL SPINE WITHOUT AND WITH CONTRAST  Technique:  Multiplanar and multiecho pulse sequences of the cervical spine, to include the craniocervical junction and cervicothoracic junction, were obtained according to standard protocol without and with intravenous contrast.  Contrast:  15 ml Multihance  Comparison: Cervical MRI dated 02/20/2013  Findings: Since the prior exam the patient has developed  abnormal signal in the posterior lateral columns of the spinal cord at the C3, C4 and C5 levels.  The lateral column involvement is more prominent on the left. There is no enhancement of this area after contrast  administration.  There is been no other change since the prior exam.  The patient has multilevel degenerative disc and joint disease, stable.  No spinal cord compression.  The posterior spinal artery as well seen and is patent.  Vertebral arteries are patent.  The pattern is consistent with subacute combined degeneration caused by the vitamin B12 deficiency.  This pattern can also be seen with HIV associated vascular myelopathy.  However, the patient is HIV negative and her B12 level is normal.  Chronic nitrous oxide exposure with normal B12 can cause this pattern.  Focal venous infarction should be considered. The pattern is not typical for transverse myelitis.  IMPRESSION: New evidence of myelopathy in the posterior columns and lateral columns at C3-C5 as described.   Original Report Authenticated By: Francene Boyers, M.D.     Medications:  I have reviewed the patient's current medications. Scheduled: . baclofen  5 mg Oral QID  . cholecalciferol  1,000 Units Oral q morning - 10a  . enoxaparin (LOVENOX) injection  40 mg Subcutaneous Q24H  . LORazepam  1 mg Intravenous Once  . meloxicam  15 mg Oral Daily  . polyethylene glycol  17 g Oral Daily  . sertraline  50 mg Oral q morning - 10a  . sodium chloride  3 mL Intravenous Q12H    Assessment/Plan: Patient remains unchanged.  Cervical spine abnormalities noted that were not present on the initial evaluation but unclear if these are significant enough to have caused the patient's lower extremity findings.  Would like to re-evaluate the lumbar spine as well.    Recommendations: 1.  MRI of the lumbar spine with and without contrast 2.  Continue PT    LOS: 6 days   Thana Farr, MD [facility] [phone] 02/25/2013  12:23 PM

## 2013-02-26 NOTE — Progress Notes (Signed)
Patient ID: Gail Gilbert, female   DOB: 1951-01-22, 62 y.o.   MRN: [id number]  PGY-1 Daily Progress Note Family Medicine Teaching Service Service Pager: [phone]   Subjective: Feels there have been no changes in her weakness but is encouraged mentally today.   Objective:  VITALS Temp:  [97.4 F (36.3 C)-98.3 F (36.8 C)] 97.9 F (36.6 C) (03/15 0510) Pulse Rate:  [74-90] 74 (03/15 0510) Resp:  [16] 16 (03/15 0510) BP: (105-119)/(64-76) 105/64 mmHg (03/15 0510) SpO2:  [96 %-98 %] 97 % (03/15 0510)  In/Out  Intake/Output Summary (Last 24 hours) at 02/26/13 0854 Last data filed at 02/26/13 0510  Gross per 24 hour  Intake    240 ml  Output   2800 ml  Net  -2560 ml    Physical Exam: Gen:  Well appearing, NAD. CV: Regular rate and rhythm, no murmurs rubs or gallops PULM: clear to auscultation bilaterally. No wheezes/rales/rhonchi ABD: soft/nontender/nondistended EXT: No edema Neuro: Alert and oriented x3. LE strength 5/5 bilateral foot dorsi/plantar flexion, 4/5 bilateral hip extension/flexion UE right 5/5 grip strength, 4/5 left.  Sensation grossly intact.  Clonus bilateral ankles L>R.   MEDS Scheduled Meds: . baclofen  5 mg Oral QID  . cholecalciferol  1,000 Units Oral q morning - 10a  . enoxaparin (LOVENOX) injection  40 mg Subcutaneous Q24H  . meloxicam  15 mg Oral Daily  . polyethylene glycol  17 g Oral Daily  . sertraline  50 mg Oral q morning - 10a  . sodium chloride  3 mL Intravenous Q12H   Continuous Infusions:  PRN Meds:.povidone-iodine, zolpidem  Labs and imaging:   CBC  Recent Labs Lab 02/20/13 1300 02/21/13 1314 02/22/13 0610  WBC 8.2 13.1* 13.0*  HGB 13.5 12.8 12.5  HCT 38.4 36.7 35.8*  PLT 300 286 271   BMET/CMET  Recent Labs Lab 02/20/13 0055 02/20/13 1300 02/22/13 0610 02/25/13 1733  NA 136  --  138  --   K 3.8  --  3.9  --   CL 101  --  102  --   CO2 25  --  25  --   BUN 16  --  21  --   CREATININE 0.58 0.52 0.53 0.61   CALCIUM 9.1  --  8.9  --   GLUCOSE 124*  --  137*  --    Results for orders placed during the hospital encounter of 02/19/13 (from the past 24 hour(s))  CREATININE, SERUM     Status: None   Collection Time    02/25/13  5:33 PM      Result Value Range   Creatinine, Ser 0.61  0.50 - 1.10 mg/dL   GFR calc non Af Amer >90  >90 mL/min   GFR calc Af Amer >90  >90 mL/min   Mr Cervical Spine W Wo Contrast  02/24/2013  *RADIOLOGY REPORT*  Clinical Data: Bilateral lower extremity paraparesis.  MRI CERVICAL SPINE WITHOUT AND WITH CONTRAST  Technique:  Multiplanar and multiecho pulse sequences of the cervical spine, to include the craniocervical junction and cervicothoracic junction, were obtained according to standard protocol without and with intravenous contrast.  Contrast:  15 ml Multihance  Comparison: Cervical MRI dated 02/20/2013  Findings: Since the prior exam the patient has developed abnormal signal in the posterior lateral columns of the spinal cord at the C3, C4 and C5 levels.  The lateral column involvement is more prominent on the left. There is no enhancement of this area after contrast administration.  There is been no other change since the prior exam.  The patient has multilevel degenerative disc and joint disease, stable.  No spinal cord compression.  The posterior spinal artery as well seen and is patent.  Vertebral arteries are patent.  The pattern is consistent with subacute combined degeneration caused by the vitamin B12 deficiency.  This pattern can also be seen with HIV associated vascular myelopathy.  However, the patient is HIV negative and her B12 level is normal.  Chronic nitrous oxide exposure with normal B12 can cause this pattern.  Focal venous infarction should be considered. The pattern is not typical for transverse myelitis.  IMPRESSION: New evidence of myelopathy in the posterior columns and lateral columns at C3-C5 as described.   Original Report Authenticated By: Francene Boyers, M.D.    Mr Lumbar Spine W Wo Contrast  02/25/2013  *RADIOLOGY REPORT*  Clinical Data: Bilateral lower extremity weakness  MRI LUMBAR SPINE WITHOUT AND WITH CONTRAST  Technique:  Multiplanar and multiecho pulse sequences of the lumbar spine were obtained without and with intravenous contrast.  Contrast: 10mL MULTIHANCE GADOBENATE DIMEGLUMINE 529 MG/ML IV SOLN  Comparison: 02/20/2013 exam done without contrast  Findings: There are non compressive disc bulges at L2-3 and above. The distal cord and conus are normal with conus tip at L1.  L3-4:  Bulging of the disc.  Bilateral facet degeneration hypertrophy with fluid in the joints.  Narrowing of the lateral recesses right worse than left.  This could worsen with standing or flexion.  Edema and enhancement of the joints right more than left could be associate with back pain.  This finding is assumed to be degenerative, the one could not rule out infectious inflammatory change.  There is some enhancement of the dorsal epidural space which is present to relate to the facet arthritis, but again the possibility of infectious inflammation is not excluded by imaging.  L4-5:  There is advanced bilateral facet arthropathy with anterolisthesis of 1 cm.  There is broad-based pseudo disc herniation.  There is been previous posterior decompression.  The central canal sufficiently decompressed.  There is stenosis of the lateral recesses right worse than left.  There is foraminal narrowing bilaterally.  There is some enhancement at the vertebral body endplates presumed to relate to discogenic change.  There is some enhancement of the facet on the left.  These findings are presumed degenerative, but early infectious inflammation cannot be excluded.  L5-S1:  Mild desiccation and bulging of the disc.  Mild facet degeneration without slippage.  No stenosis.  IMPRESSION: Today's study was done with contrast as opposed to the previous exam.  The overall morphologic findings are  unchanged.  There is lateral recess stenosis at L3-4 right worse than left because of bulging of the disc in combination with facet arthropathy.  Fluid filled facets could allow slippage of this level with flexion or extension which could cause worsening of the lateral recess stenosis.  L4-5 shows advanced facet arthropathy with anterolisthesis of 1 cm and broad-based pseudo disc herniation. The central canal has been decompressed surgically.  There is stenosis of the lateral recesses right worse than left and of the foraminal left worse than right.  Because contrast was administered on this exam, one can appreciate enhancement and edema in the region of the facet on the right at L3- 4, the facet on the left at L3-4 to a lesser extent, and the facets at L4-5 left worse than right.  There is also some endplate enhancement  on the left at L4-5.  There is some enhancement in the dorsal epidural space in the L3-L4 region.  I think it is most likely that this finding relates to degenerative disease and epidural inflammation associated with that.  However, by imaging, one could not rule out the possibility of infectious inflammation. If there is clinical evidence of infectious inflammation, this study may lend support to that.   Original Report Authenticated By: Paulina Fusi, M.D.    Assessment  62 yo F presents with acute onset of bilateral LE weakness and tingling and RUE weakness and tingling with urinary retention.      1. Bilateral LE and RUE weakness: acute onset. Weakness without atrophy and with hyperreflexia on exam concerning for UMN lesion. Concern still possible this is transverse myelitis w/ UE paraparesis  -- MRI Cervical spine w/ contrast 3/13 shows cervical myelopathy.  MRI w/ contrast of Lumbar spine does not show differences from previous MRI w/out contrast --Neurology following, greatly appreciate recommendations: started solumedrol 500mg  IV 3/9; Steroids now discontinued after speaking with Dr.  Thad Ranger. --Baclofen 5mg  PO QID for spasticity 3/11 --LP--initial results show no inflammation; non-inflammatory causes of transverse myelitis include spinal vascular infaract, metabolic/nutritional myelopathies; appreciate Neurology recs.   -- Work up continuing: TSH (1.001), B12 (765 norm), ANA (negative), lyme titer (negative), anti-GAD antibody (pending), Ceruloplasmin (normal), GAD (pending);  --PT/PM&R consult 3/11--recommendation for inpatient rehab when bed available, patient is amenable. --STI panel negative: HIV, RPR, G/C.   2. Depression --continue home sertraline 50mg  PO once daily  3. Osteopoarthritis --continue home mobic 15 mg PO once daily --vit D 1000 units PO once daily  FEN/GI: Regular diet. Foley in place.  DVT PPX: Lovenox ppx dose.  Code: Full.  Dispo: Inpatient rehab pending dx and improvement    Judie Grieve R. Paulina Fusi, DO of [facility] 02/26/2013, 9:41 AM

## 2013-02-26 NOTE — Progress Notes (Signed)
FMTS Attending Note Patient seen and examined this morning, reviewed and agree with Dr Paulina Fusi' assessment and plan for today.  [name] reports feeling about the same, a little more upbeat.  Was out of bed and worked with PT yesterday.   We appreciate Neurology's consult and guidance in leading her workup and attempt to arrive at a diagnosis.    We will contact care manager regarding financial assessment for possible Medicaid application that will enhance her options for intensive therapy following discharge from acute-care hospital setting.  Paula Compton, MD

## 2013-02-26 NOTE — Progress Notes (Signed)
Subjective: Patient reports that she feels that she is moving her legs better.  MRI of the lumbar spine with contrast shows some enhancement likely related to her lumbar disc disease.  Vitamin D, GM1 and anti GAD antibodies remain pending.    Objective: Current vital signs: BP 105/64  Pulse 74  Temp(Src) 97.9 F (36.6 C) (Axillary)  Resp 16  Ht 5\' 8"  (1.727 m)  Wt 61.689 kg (136 lb)  BMI 20.68 kg/m2  SpO2 97% Vital signs in last 24 hours: Temp:  [97.4 F (36.3 C)-98.3 F (36.8 C)] 97.9 F (36.6 C) (03/15 0510) Pulse Rate:  [74-90] 74 (03/15 0510) Resp:  [16] 16 (03/15 0510) BP: (105-119)/(64-76) 105/64 mmHg (03/15 0510) SpO2:  [96 %-98 %] 97 % (03/15 0510)  Intake/Output from previous day: 03/14 0701 - 03/15 0700 In: 240 [P.O.:240] Out: 2800 [Urine:2800] Intake/Output this shift:   Nutritional status: General  Neurologic Exam: Mental Status:  Alert, oriented, thought content appropriate. Speech fluent without evidence of aphasia. Able to follow 3 step commands without difficulty.  Cranial Nerves:  II: Discs flat bilaterally; Visual fields grossly normal, pupils equal, round, reactive to light and accommodation  III,IV, VI: ptosis not present, extra-ocular motions intact bilaterally  V,VII: smile symmetric, facial light touch sensation normal bilaterally  VIII: hearing normal bilaterally  IX,X: gag reflex present  XI: bilateral shoulder shrug  XII: midline tongue extension  Motor:  Right : Upper extremity 5/5          Left: Upper extremity 5/5   Lower extremity 4-/5 proximally     Lower extremity 4-/5 proximally  Lower extremities are 5/5 distally  Tone and bulk:normal tone throughout; no atrophy noted  Sensory: Pinprick and light touch decreased in the lower extremities and in the ulnar aspect of the RUE  Deep Tendon Reflexes: 3+ and symmetric throughout  Plantars:  Right: upgoing    Left: upgoing  Cerebellar:  normal finger-to-nose, normal rapid alternating  movements and normal heel-to-shin test  Gait: unable to be tested  CV: pulses palpable throughout    Lab Results: Basic Metabolic Panel:  Recent Labs Lab 02/20/13 0055 02/20/13 0713 02/20/13 1300 02/22/13 0610 02/25/13 1733  NA 136  --   --  138  --   K 3.8  --   --  3.9  --   CL 101  --   --  102  --   CO2 25  --   --  25  --   GLUCOSE 124*  --   --  137*  --   BUN 16  --   --  21  --   CREATININE 0.58  --  0.52 0.53 0.61  CALCIUM 9.1  --   --  8.9  --   MG  --  2.1  --   --   --     Liver Function Tests: No results found for this basename: AST, ALT, ALKPHOS, BILITOT, PROT, ALBUMIN,  in the last 168 hours No results found for this basename: LIPASE, AMYLASE,  in the last 168 hours No results found for this basename: AMMONIA,  in the last 168 hours  CBC:  Recent Labs Lab 02/20/13 0055 02/20/13 1300 02/21/13 1314 02/22/13 0610  WBC 6.5 8.2 13.1* 13.0*  NEUTROABS 4.8  --   --   --   HGB 12.6 13.5 12.8 12.5  HCT 36.3 38.4 36.7 35.8*  MCV 89.9 89.7 90.2 89.1  PLT 287 300 286 271    Cardiac  Enzymes: No results found for this basename: CKTOTAL, CKMB, CKMBINDEX, TROPONINI,  in the last 168 hours  Lipid Panel: No results found for this basename: CHOL, TRIG, HDL, CHOLHDL, VLDL, LDLCALC,  in the last 168 hours  CBG: No results found for this basename: GLUCAP,  in the last 168 hours  Microbiology: Results for orders placed during the hospital encounter of 02/19/13  CSF CULTURE     Status: None   Collection Time    02/21/13  4:09 PM      Result Value Range Status   Specimen Description CSF   Final   Special Requests NONE   Final   Gram Stain     Final   Value: WBC PRESENT, PREDOMINANTLY MONONUCLEAR     NO ORGANISMS SEEN     CYTOSPUN Performed at [facility]   Culture NO GROWTH 3 DAYS   Final   Report Status 02/25/2013 FINAL   Final  GRAM STAIN     Status: None   Collection Time    02/21/13  4:10 PM      Result Value Range Status   Specimen  Description CSF   Final   Special Requests NONE   Final   Gram Stain     Final   Value: CYTOSPIN SLIDE     WBC PRESENT, PREDOMINANTLY MONONUCLEAR     NO ORGANISMS SEEN   Report Status 02/21/2013 FINAL   Final  FUNGUS CULTURE W SMEAR     Status: None   Collection Time    02/21/13  4:11 PM      Result Value Range Status   Specimen Description CSF   Final   Special Requests NONE   Final   Fungal Smear NO YEAST OR FUNGAL ELEMENTS SEEN   Final   Culture CULTURE IN PROGRESS FOR FOUR WEEKS   Final   Report Status PENDING   Incomplete  GC/CHLAMYDIA PROBE AMP     Status: None   Collection Time    02/23/13  8:39 PM      Result Value Range Status   CT Probe RNA NEGATIVE  NEGATIVE Final   GC Probe RNA NEGATIVE  NEGATIVE Final   Comment: (NOTE)                                                                                              Normal Reference Range: Negative          Assay performed using the Gen-Probe APTIMA COMBO2 (R) Assay.     Acceptable specimen types for this assay include APTIMA Swabs (Unisex,     endocervical, urethral, or vaginal), first void urine, and ThinPrep     liquid based cytology samples.    Coagulation Studies: No results found for this basename: LABPROT, INR,  in the last 72 hours  Imaging: Mr Cervical Spine W Wo Contrast  02/24/2013  *RADIOLOGY REPORT*  Clinical Data: Bilateral lower extremity paraparesis.  MRI CERVICAL SPINE WITHOUT AND WITH CONTRAST  Technique:  Multiplanar and multiecho pulse sequences of the cervical spine, to include the craniocervical junction and cervicothoracic junction, were obtained according to standard  protocol without and with intravenous contrast.  Contrast:  15 ml Multihance  Comparison: Cervical MRI dated 02/20/2013  Findings: Since the prior exam the patient has developed abnormal signal in the posterior lateral columns of the spinal cord at the C3, C4 and C5 levels.  The lateral column involvement is more prominent on the left.  There is no enhancement of this area after contrast administration.  There is been no other change since the prior exam.  The patient has multilevel degenerative disc and joint disease, stable.  No spinal cord compression.  The posterior spinal artery as well seen and is patent.  Vertebral arteries are patent.  The pattern is consistent with subacute combined degeneration caused by the vitamin B12 deficiency.  This pattern can also be seen with HIV associated vascular myelopathy.  However, the patient is HIV negative and her B12 level is normal.  Chronic nitrous oxide exposure with normal B12 can cause this pattern.  Focal venous infarction should be considered. The pattern is not typical for transverse myelitis.  IMPRESSION: New evidence of myelopathy in the posterior columns and lateral columns at C3-C5 as described.   Original Report Authenticated By: Francene Boyers, M.D.    Mr Lumbar Spine W Wo Contrast  02/25/2013  *RADIOLOGY REPORT*  Clinical Data: Bilateral lower extremity weakness  MRI LUMBAR SPINE WITHOUT AND WITH CONTRAST  Technique:  Multiplanar and multiecho pulse sequences of the lumbar spine were obtained without and with intravenous contrast.  Contrast: 10mL MULTIHANCE GADOBENATE DIMEGLUMINE 529 MG/ML IV SOLN  Comparison: 02/20/2013 exam done without contrast  Findings: There are non compressive disc bulges at L2-3 and above. The distal cord and conus are normal with conus tip at L1.  L3-4:  Bulging of the disc.  Bilateral facet degeneration hypertrophy with fluid in the joints.  Narrowing of the lateral recesses right worse than left.  This could worsen with standing or flexion.  Edema and enhancement of the joints right more than left could be associate with back pain.  This finding is assumed to be degenerative, the one could not rule out infectious inflammatory change.  There is some enhancement of the dorsal epidural space which is present to relate to the facet arthritis, but again the  possibility of infectious inflammation is not excluded by imaging.  L4-5:  There is advanced bilateral facet arthropathy with anterolisthesis of 1 cm.  There is broad-based pseudo disc herniation.  There is been previous posterior decompression.  The central canal sufficiently decompressed.  There is stenosis of the lateral recesses right worse than left.  There is foraminal narrowing bilaterally.  There is some enhancement at the vertebral body endplates presumed to relate to discogenic change.  There is some enhancement of the facet on the left.  These findings are presumed degenerative, but early infectious inflammation cannot be excluded.  L5-S1:  Mild desiccation and bulging of the disc.  Mild facet degeneration without slippage.  No stenosis.  IMPRESSION: Today's study was done with contrast as opposed to the previous exam.  The overall morphologic findings are unchanged.  There is lateral recess stenosis at L3-4 right worse than left because of bulging of the disc in combination with facet arthropathy.  Fluid filled facets could allow slippage of this level with flexion or extension which could cause worsening of the lateral recess stenosis.  L4-5 shows advanced facet arthropathy with anterolisthesis of 1 cm and broad-based pseudo disc herniation. The central canal has been decompressed surgically.  There is stenosis of  the lateral recesses right worse than left and of the foraminal left worse than right.  Because contrast was administered on this exam, one can appreciate enhancement and edema in the region of the facet on the right at L3- 4, the facet on the left at L3-4 to a lesser extent, and the facets at L4-5 left worse than right.  There is also some endplate enhancement on the left at L4-5.  There is some enhancement in the dorsal epidural space in the L3-L4 region.  I think it is most likely that this finding relates to degenerative disease and epidural inflammation associated with that.  However, by  imaging, one could not rule out the possibility of infectious inflammation. If there is clinical evidence of infectious inflammation, this study may lend support to that.   Original Report Authenticated By: Paulina Fusi, M.D.     Medications:  I have reviewed the patient's current medications. Scheduled: . baclofen  5 mg Oral QID  . cholecalciferol  1,000 Units Oral q morning - 10a  . enoxaparin (LOVENOX) injection  40 mg Subcutaneous Q24H  . meloxicam  15 mg Oral Daily  . polyethylene glycol  17 g Oral Daily  . sertraline  50 mg Oral q morning - 10a  . sodium chloride  3 mL Intravenous Q12H    Assessment/Plan: Patient shows some improvement in her lower extremity strength.  Do not feel that further imaging is necessary.  Cervical abnormalities are unusual in character on imaging.  Transverse myelitis remains on the differential despite the fact that there was no evidence of inflammation on LP.  Patient has been treated with steroids.  Would continue with therapy.  Recommendations: 1.  Would continue with therapy 2.  Will continue to follow up results of outstanding lab work.     LOS: 7 days   Thana Farr, MD [facility] [phone] 02/26/2013  11:18 AM

## 2013-02-26 NOTE — Progress Notes (Signed)
FMTS Attending Admission Note: Gail Eniola,MD  Patient was seen and examined by Dr Breen, I reviewed their chart. I have discussed this patient with the resident. I agree with the resident's findings, assessment and care plan. 

## 2013-02-27 DIAGNOSIS — R339 Retention of urine, unspecified: Secondary | ICD-10-CM | POA: Diagnosis present

## 2013-02-27 NOTE — Progress Notes (Signed)
FMTS Attending Note Patient seen and examined by me today.  She is in armchair on computer, is relieved that she was able to get from bed--> chair by herself today. Still with indwelling foley.  She has been doing PT exercises for muscle strengthening, feels good about her ability to do strengthening herself.   Would appreciate the thoughts of Neurology service regarding any further conditions for discharge from their standpoint (regarding either further acute inpatient workup or clinical milestones to be met before d/c).  Also, to touch base with CIR service regarding likelihood of her meeting requirements for transfer there.  One condition for admission will be her likelihood of being enrolled in [place]; Care Manager to help [place] with this process.  If patient is likely ready for discharge soon, may consider another trial of void with follow up bladder scan before deciding whether she will need to be discharged with indwelling foley.  Paula Compton, MD

## 2013-02-27 NOTE — Progress Notes (Signed)
Physical Therapy Treatment Patient Details Name: Gail Gilbert MRN: [id number] DOB: [birth year] Today's Date: 02/27/2013 Time: 4098-1191 PT Time Calculation (min): 15 min  PT Assessment / Plan / Recommendation Comments on Treatment Session  Pt with some improvements in mobility today.  Patient with max cues for gait safety and to decr gait deviations.  Pt tolerated ther-ex well.  Will continue to see and progress acitivity as tolerated.     Follow Up Recommendations  CIR           Equipment Recommendations  Other (comment) (TBD)    Recommendations for Other Services Rehab consult  Frequency Min 4X/week   Plan Discharge plan remains appropriate;Frequency remains appropriate    Precautions / Restrictions Precautions Precautions: Fall Restrictions Weight Bearing Restrictions: No       Mobility  Bed Mobility Bed Mobility: Not assessed Transfers Sit to Stand: 3: Mod assist;From chair/3-in-1;With upper extremity assist;With armrests Sit to Stand: Patient Percentage: 60% Stand to Sit: 3: Mod assist;With armrests;With upper extremity assist;To chair/3-in-1 Details for Transfer Assistance: vc's for hand placement and technique with sit<>stand from recliner and from chair without arm rests. Incr assist needed with transfer to/from chair without arm rests for control of descent with sitting and with ant wt shifting with standing. cues/assist for sitting safety into recliner after second gait set due to pt with premature attemtpt to sit (not fully at recliner). Ambulation/Gait Ambulation/Gait Assistance: 2: Max assist;3: Mod assist Ambulation/Gait: Patient Percentage: 70% Ambulation Distance (Feet): 12 Feet (58ft x2 sets) Assistive device: Rolling walker Ambulation/Gait Assistance Details: Max cues for LE control (pt with hyperext with quad setting on bil legs with stance for every step), step placment, walker postion with gait, to stay within walker with turning and to decr step length  for incr step control. Gait Pattern: Ataxic;Scissoring;Step-through pattern;Narrow base of support;Right genu recurvatum;Left genu recurvatum;Right steppage;Left steppage Gait velocity: Decreased    Exercises General Exercises - Lower Extremity Long Arc Quad: AROM;Strengthening;Both;10 reps;Seated;Limitations Long [employer] Limitations: cues to slow down for incr muscle activation and benefit of exercise. Hip ABduction/ADduction: AROM;Strengthening;Both;10 reps;Seated;Limitations Hip Abduction/Adduction Limitations: cues to keep knee and foot in alignment to work hips without compensatory movements Toe Raises: AROM;Strengthening;Both;10 reps;Seated Heel Raises: AROM;Strengthening;10 reps;Seated Mini-Sqauts: AAROM;Strengthening;Both;5 reps;Standing;Limitations Mini Squats Limitations: with RW support and min assist for balance. cues for technique     PT Goals Acute Rehab PT Goals PT Goal: Sit to Stand - Progress: Progressing toward goal Pt will Transfer Bed to Chair/Chair to Bed: with modified independence PT Transfer Goal: Bed to Chair/Chair to Bed - Progress: Progressing toward goal Pt will Stand: with mod assist;1 - 2 min;with bilateral upper extremity support PT Goal: Stand - Progress: Progressing toward goal Pt will Ambulate: 1 - 15 feet;with mod assist;with rolling walker PT Goal: Ambulate - Progress: Met Pt will Perform Home Exercise Program: Independently PT Goal: Perform Home Exercise Program - Progress: Progressing toward goal  Visit Information  Last PT Received On: 02/27/13 Assistance Needed: +2    Subjective Data  Subjective: No new complaints, agreeable to therapy at this time.   Cognition  Cognition Overall Cognitive Status: Appears within functional limits for tasks assessed/performed Arousal/Alertness: Awake/alert Orientation Level: Appears intact for tasks assessed Behavior During Session: [facility] for tasks performed       End of Session PT - End of  Session Equipment Utilized During Treatment: Gait belt Activity Tolerance: Patient tolerated treatment well Patient left: in chair;with call bell/phone within reach   GP  Sallyanne Kuster 02/27/2013, 2:57 PM  Sallyanne Kuster, PTA Office- [phone]

## 2013-02-27 NOTE — Progress Notes (Signed)
Patient seen and examined by me today, discussed with Dr Adriana Simas and I agree with his assess/plan for today.  Please see my separate note for further details. Paula Compton, MD

## 2013-02-27 NOTE — Progress Notes (Signed)
PGY-1 Daily Progress Note Family Medicine Teaching Service Service Pager: [phone]   Subjective: Feeling okay this am.  Reports that she feels weakness is improving.  Patient is now ambulating with walker with PT.  Objective:  Filed Vitals:   02/27/13 0607  BP: 105/61  Pulse: 78  Temp: 98.6 F (37 C)  Resp: 18   Physical Exam: Gen:  Well appearing, NAD. CV: Regular rate and rhythm, no murmurs rubs or gallops PULM: clear to auscultation bilaterally. No wheezes/rales/rhonchi ABD: soft/nontender/nondistended EXT: No edema Neuro: Alert and oriented x3. LE strength 5/5 bilateral foot dorsi/plantar flexion, 5/5 bilateral hip extension/flexion UE right 5/5 grip strength, 4/5 left.  Sensation grossly intact.   Labs and imaging:  CBC  Recent Labs Lab 02/20/13 1300 02/21/13 1314 02/22/13 0610  WBC 8.2 13.1* 13.0*  HGB 13.5 12.8 12.5  HCT 38.4 36.7 35.8*  PLT 300 286 271   BMET/CMET  Recent Labs Lab 02/20/13 1300 02/22/13 0610 02/25/13 1733  NA  --  138  --   K  --  3.9  --   CL  --  102  --   CO2  --  25  --   BUN  --  21  --   CREATININE 0.52 0.53 0.61  CALCIUM  --  8.9  --   GLUCOSE  --  137*  --    CSF studies - WBC 1, No Segs, Few lymphs, Rare Macrophages, No eosiniphils, Clear and colorless CSF culture - Negative x 3 days (Final)  Mr Laqueta Jean Wo Contrast 02/20/2013  IMPRESSION: Scattered small subcortical white matter hyperintensities in the cerebral white matter on the right.  These are most likely related to chronic microvascular ischemia.  Differential includes migraine headache and vasculitis.  No acute infarct or mass.  Multiple sclerosis not felt likely.   Mr Thoracic Spine Wo Contrast 02/20/2013  IMPRESSION: The conus medullaris is normal.  Lumbar degenerative changes as above.  These appear chronic.  I discussed the findings by telephone with Dr. Roseanne Reno.   Original Report Authenticated By: Janeece Riggers, M.D.    Mr Cervical Spine W Wo Contrast 02/24/2013   IMPRESSION: New evidence of myelopathy in the posterior columns and lateral columns at C3-C5 as described.     Mr Lumbar Spine W Wo Contrast 02/25/2013  MPRESSION: Today's study was done with contrast as opposed to the previous exam.  The overall morphologic findings are unchanged.  There is lateral recess stenosis at L3-4 right worse than left because of bulging of the disc in combination with facet arthropathy.  Fluid filled facets could allow slippage of this level with flexion or extension which could cause worsening of the lateral recess stenosis.  L4-5 shows advanced facet arthropathy with anterolisthesis of 1 cm and broad-based pseudo disc herniation. The central canal has been decompressed surgically.  There is stenosis of the lateral recesses right worse than left and of the foraminal left worse than right.  Because contrast was administered on this exam, one can appreciate enhancement and edema in the region of the facet on the right at L3- 4, the facet on the left at L3-4 to a lesser extent, and the facets at L4-5 left worse than right.  There is also some endplate enhancement on the left at L4-5.  There is some enhancement in the dorsal epidural space in the L3-L4 region.  I think it is most likely that this finding relates to degenerative disease and epidural inflammation associated with that.  However,  by imaging, one could not rule out the possibility of infectious inflammation. If there is clinical evidence of infectious inflammation, this study may lend support to that.   Assessment/Plan: 62 yo F presents with acute onset of bilateral LE weakness and tingling and RUE weakness and tingling with urinary retention.      1. Bilateral LE and RUE weakness: acute onset. Weakness without atrophy and with hyperreflexia on exam concerning for UMN lesion. Unclear etiology at this time.  ? Transverse myelitis. -- MRI Cervical spine w/ contrast 3/13 shows cervical myelopathy.  MRI w/ contrast of Lumbar  spine does not show differences from previous MRI w/out contrast --Neurology following, greatly appreciate recommendations: started solumedrol 500mg  IV 3/9; Steroids now discontinued after speaking with Dr. Thad Ranger. --Baclofen 5mg  PO QID for spasticity 3/11 --LP--initial results show no inflammation or signs of infection. -- Work up: TSH (1.001), B12 (765 norm), ANA (negative), lyme titer (negative), anti-GAD antibody (pending), Ceruloplasmin (normal), GAD (pending); STI panel negative: HIV, RPR, G/C.  -- Awaiting CIR placement. Admissions coordinator to see on Monday. - Will continue to follow neurology recommendations.    2. Depression --continue home sertraline 50mg  PO once daily  3. Osteoarthritis --continue home mobic 15 mg PO once daily --vit D 1000 units PO once daily  FEN/GI: Regular diet. Foley in place.  Will D/C foley today for void trial today. DVT PPX: Lovenox ppx dose.  Code: Full.  Dispo: Inpatient rehab pending

## 2013-02-28 DIAGNOSIS — R339 Retention of urine, unspecified: Secondary | ICD-10-CM

## 2013-02-28 LAB — VITAMIN D 1,25 DIHYDROXY
Vitamin D 1, 25 (OH)2 Total: 76 pg/mL — ABNORMAL HIGH (ref 18–72)
Vitamin D3 1, 25 (OH)2: 76 pg/mL

## 2013-02-28 LAB — GLUTAMIC ACID DECARBOXYLASE AUTO ABS: Glutamic Acid Decarb Ab: <1 U/mL (ref <=1.0)

## 2013-02-28 NOTE — Progress Notes (Signed)
Physical Therapy Treatment Patient Details Name: Gail Gilbert MRN: [id number] DOB: [birth year] Today's Date: 02/28/2013 Time: 4098-1191 PT Time Calculation (min): 29 min  PT Assessment / Plan / Recommendation Comments on Treatment Session  Pt very motivated throughout session today. Pt continues to present with significant deficits in functional mobility secondary to decreased balance; coordination; strength and control of Bilateral LEs. Patient demonstrates good activity tolerance and was able to perform ther-ex with good recall.  Feel patient is an ideal candidate for CIR with intense therapy sessions to improve functional mobility and maximize potential for independence for discharge home.      Follow Up Recommendations  CIR     Does the patient have the potential to tolerate intense rehabilitation   Yes     Equipment Recommendations  Other (comment) (TBD)    Recommendations for Other Services Rehab consult  Frequency Min 4X/week   Plan Discharge plan remains appropriate;Frequency remains appropriate    Precautions / Restrictions Precautions Precautions: Fall Restrictions Weight Bearing Restrictions: No   Pertinent Vitals/Pain No pain reported at this time    Mobility  Bed Mobility Bed Mobility: Supine to Sit;Sitting - Scoot to Edge of Bed Supine to Sit: 4: Min assist Sitting - Scoot to [place] of Bed: 4: Min assist Details for Bed Mobility Assistance: VC's for body positioning Transfers Transfers: Sit to Stand;Stand to Sit;Squat Pivot Transfers Sit to Stand: 3: Mod assist;From chair/3-in-1;With upper extremity assist;With armrests Stand to Sit: 3: Mod assist;With armrests;With upper extremity assist;To chair/3-in-1 Squat Pivot Transfers: 4: Min guard Details for Transfer Assistance: Pt able to perform transfers to and from chair/commode with min guard for safety; pt relies heavily on UE for pivot transfers with no real support from bilateral LE.  Pt requires assist to stand  secondary to sesory deficits and instability with bilateral lower extremities. Pt demonstrates better control with VC's for technique and muscle setting as compensatory strategy.  Ambulation/Gait Ambulation/Gait Assistance: 3: Mod assist Ambulation/Gait: Patient Percentage: 70% Ambulation Distance (Feet): 35 Feet Assistive device: Rolling walker Ambulation/Gait Assistance Details: VC's for sequencing and multimodal cues to assist with placing in order to facilitate advancement over LE. Patient very uncoordinated and relies on visual feedback to improve positioning. Gait Pattern: Ataxic;Scissoring;Step-through pattern;Narrow base of support;Right genu recurvatum;Left genu recurvatum;Right steppage;Left steppage Gait velocity: Decreased General Gait Details: Pt with multiple balance checks and 2 noted LOb requiring full assist to prevent fall. Pt would benefit from continued training to improve activation and coordination with gait stability. Stairs: No    Exercises General Exercises - Lower Extremity Long Arc Quad: AROM;Strengthening;Both;10 reps;Seated;Limitations Long [employer] Limitations: cues to slow down for incr muscle activation and benefit of exercise. Hip ABduction/ADduction: AROM;Strengthening;Both;10 reps;Seated;Limitations Hip Abduction/Adduction Limitations: cues to keep knee and foot in alignment to work hips without compensatory movements Other Exercises Other Exercises: bridging supine with support and abdominal contraction Other Exercises: keegle to assist with pelvic floor strengthening to address urinary control deficits     PT Goals Acute Rehab PT Goals PT Goal Formulation: With patient Time For Goal Achievement: 03/07/13 Potential to Achieve Goals: Good Pt will go Supine/Side to Sit: with supervision;with HOB 0 degrees PT Goal: Supine/Side to Sit - Progress: Progressing toward goal Pt will go Sit to Stand: with supervision PT Goal: Sit to Stand - Progress:  Progressing toward goal Pt will Transfer Bed to Chair/Chair to Bed: with modified independence PT Transfer Goal: Bed to Chair/Chair to Bed - Progress: Progressing toward goal Pt will Stand: with mod  assist;1 - 2 min;with bilateral upper extremity support PT Goal: Stand - Progress: Progressing toward goal Pt will Ambulate: 16 - 50 feet;with supervision;with rolling walker PT Goal: Ambulate - Progress: Progressing toward goal Pt will Perform Home Exercise Program: Independently PT Goal: Perform Home Exercise Program - Progress: Progressing toward goal  Visit Information  Last PT Received On: 02/28/13 Assistance Needed: +2    Subjective Data  Subjective: No new complaints, agreeable to therapy at this time.   Cognition  Cognition Overall Cognitive Status: Appears within functional limits for tasks assessed/performed Arousal/Alertness: Awake/alert Orientation Level: Appears intact for tasks assessed Behavior During Session: [facility] for tasks performed    Balance  Balance Balance Assessed: Yes High Level Balance High Level Balance Activites: Side stepping;Direction changes;Turns High Level Balance Comments: pt with increased difficulty performing these tasks; requires manual assist for control and stability  End of Session PT - End of Session Equipment Utilized During Treatment: Gait belt Activity Tolerance: Patient tolerated treatment well Patient left: in chair;with call bell/phone within reach Nurse Communication: Mobility status   GP     Fabio Asa 02/28/2013, 4:25 PM

## 2013-02-28 NOTE — Progress Notes (Signed)
Patient voided 250 ml clear amber urine.  Post void bladder scan showed 112 ml in bladder.  Will continue to monitor.

## 2013-02-28 NOTE — Care Management Note (Signed)
  Page 1 of 1   02/28/2013     10:37:15 AM   CARE MANAGEMENT NOTE 02/28/2013  Patient:  Gail Gilbert,Gail Gilbert   Account Number:  [id number]  Date Initiated:  02/22/2013  Documentation initiated by:  Ronny Flurry  Subjective/Objective Assessment:   Probable acute transverse myelitis as etiology for her distal upper extremity and bilateral lower extremity weakness and numbness.     Action/Plan:   Solumedrol IV BID   Anticipated DC Date:     Anticipated DC Plan:  IP REHAB FACILITY  In-house referral  Financial Counselor         Choice offered to / List presented to:             Status of service:  In process, will continue to follow Medicare Important Message given?   (If response is "NO", the following Medicare IM given date fields will be blank) Date Medicare IM given:   Date Additional Medicare IM given:    Discharge Disposition:    Per UR Regulation:  Reviewed for med. necessity/level of care/duration of stay  If discussed at Long Length of Stay Meetings, dates discussed:    Comments:  02-28-13 referral for : Medicaid candidate vs financial counseling., left voice mail with  financial counselor . Ronny Flurry RN BSN [phone]

## 2013-02-28 NOTE — Progress Notes (Signed)
Patient ID: Gail Gilbert, female   DOB: 14-Jul-1951, 62 y.o.   MRN: [id number] PGY-1 Daily Progress Note Family Medicine Teaching Service Service Pager: [phone]   Subjective: Feeling good this AM. Strength mildly improved since admission in LE. Coordination improved in RUE, tingling still present. Able to urinate with some dribbling. No BMs since enema on 3/13. No abdominal complaints. Would like some fiber.    Objective:  Filed Vitals:   02/28/13 0615  BP: 114/78  Pulse: 75  Temp: 98.3 F (36.8 C)  Resp: 18   Physical Exam: Gen:  Well appearing, NAD. CV: Regular rate and rhythm, no murmurs rubs or gallops PULM: clear to auscultation bilaterally. No wheezes/rales/rhonchi ABD: soft/nontender/nondistended EXT: No edema Neuro: Alert and oriented x3. LE strength 5/5 bilateral foot dorsi/plantar flexion, 5/5 bilateral hip extension/flexion. Bilateral ankle clonus, upgoing babinskis.  UE right 5/5 grip strength, 5/5 left.  Sensation grossly intact.   Labs and imaging:  CBC  Recent Labs Lab 02/21/13 1314 02/22/13 0610  WBC 13.1* 13.0*  HGB 12.8 12.5  HCT 36.7 35.8*  PLT 286 271   BMET/CMET  Recent Labs Lab 02/22/13 0610 02/25/13 1733  NA 138  --   K 3.9  --   CL 102  --   CO2 25  --   BUN 21  --   CREATININE 0.53 0.61  CALCIUM 8.9  --   GLUCOSE 137*  --    CSF studies - WBC 1, No Segs, Few lymphs, Rare Macrophages, No eosiniphils, Clear and colorless CSF culture - Negative x 3 days (Final)  Mr Laqueta Jean Wo Contrast 02/20/2013  IMPRESSION: Scattered small subcortical white matter hyperintensities in the cerebral white matter on the right.  These are most likely related to chronic microvascular ischemia.  Differential includes migraine headache and vasculitis.  No acute infarct or mass.  Multiple sclerosis not felt likely.   Mr Thoracic Spine Wo Contrast 02/20/2013  IMPRESSION: The conus medullaris is normal.  Lumbar degenerative changes as above.  These appear chronic.   I discussed the findings by telephone with Dr. Roseanne Reno.   Original Report Authenticated By: Janeece Riggers, M.D.    Mr Cervical Spine W Wo Contrast 02/24/2013  IMPRESSION: New evidence of myelopathy in the posterior columns and lateral columns at C3-C5 as described.     Mr Lumbar Spine W Wo Contrast 02/25/2013  MPRESSION: Today's study was done with contrast as opposed to the previous exam.  The overall morphologic findings are unchanged.  There is lateral recess stenosis at L3-4 right worse than left because of bulging of the disc in combination with facet arthropathy.  Fluid filled facets could allow slippage of this level with flexion or extension which could cause worsening of the lateral recess stenosis.  L4-5 shows advanced facet arthropathy with anterolisthesis of 1 cm and broad-based pseudo disc herniation. The central canal has been decompressed surgically.  There is stenosis of the lateral recesses right worse than left and of the foraminal left worse than right.  Because contrast was administered on this exam, one can appreciate enhancement and edema in the region of the facet on the right at L3- 4, the facet on the left at L3-4 to a lesser extent, and the facets at L4-5 left worse than right.  There is also some endplate enhancement on the left at L4-5.  There is some enhancement in the dorsal epidural space in the L3-L4 region.  I think it is most likely that this finding relates to  degenerative disease and epidural inflammation associated with that.  However, by imaging, one could not rule out the possibility of infectious inflammation. If there is clinical evidence of infectious inflammation, this study may lend support to that.   Assessment/Plan: 62 yo F presents with acute onset of bilateral LE weakness and tingling and RUE weakness and tingling with urinary retention.      1. Bilateral LE and RUE weakness: acute onset. Weakness without atrophy and with hyperreflexia on exam concerning for UMN  lesion. Unclear etiology at this time, possibly transverse myelitis or cervical spondylotic myelopathy due to acute injury of chronically degenerated spine.   -- MRI Cervical spine w/ contrast 3/13 shows cervical myelopathy.  MRI w/ contrast of Lumbar spine shows possible inflammatory changes but no structural differences from previous MRI w/out contrast --Neurology following, greatly appreciate recommendations: started solumedrol 500mg  IV 3/9; Steroids now discontinued after speaking with Dr. Thad Ranger 3/13. --Baclofen 5mg  PO QID for spasticity 3/11 --LP--initial results show no inflammation or signs of infection. -- Work up: TSH (1.001), B12 (765 norm), ANA (negative), lyme titer (negative), anti-GAD antibody (pending), Ceruloplasmin (normal), GAD (pending); STI panel negative: HIV, RPR, G/C.  -- Awaiting CIR placement. Admissions coordinator to see on Monday. - Will continue to follow neurology recommendations.    2. Depression --continue home sertraline 50mg  PO once daily  3. Osteoarthritis --continue home mobic 15 mg PO once daily --vit D 1000 units PO once daily  FEN/GI: Regular diet. Foley in place.  Successful voiding trial, d/c foley for now.  --miralax BID 3/17; add metamucil  DVT PPX: Lovenox ppx dose.  Code: Full.  Dispo: Inpatient rehab pending; Need social work to coordinate Personal assistant  Family Medicine Upper Level Addendum:   I have seen and examined the patient independently, discussed with Ellin Goodie MSIV, fully reviewed the progress note and agree with it's contents with the additions as noted in blue text. My independent exam is below.   S: Patient states that she is encouraged that she has been told her strength has improved but she still doesn't feel normal when doing things shes told she has 5/5 strength in such as finger grip.   O: BP 114/78  Pulse 75  Temp(Src) 98.3 F (36.8 C) (Oral)  Resp 18  Ht 5\' 8"  (1.727 m)  Wt 136 lb (61.689 kg)  BMI 20.68 kg/m2   SpO2 97% Gen: NAD, resting comfortably in bedside chair HEENT: MMM CV: RRR no mrg  Lungs: CTAB  Abd: soft/nontender/nondistended/normal bowel sounds  Ext: no edema  Skin: warm and dry, no rash  Neuro: lower extremities 4/5 in hip flexors but 5/5 dorsi and plantar flexion. 4+/5 hip flexors. UE 5/5 throughout. I cannot break her after a few seconds as previously able. Clonus persists in bilateral feet.   A/P:  62 yo f with acute onset lower extremity weakness and urinary retention, currently undergoing evaluation for cause. Concern still persists for transverse myelitis but has completed course of high dose steroids.   - Awaiting CIR evaluation, continue PT/OT  -spoke with CIR today who will discuss cost with patient of each day in CIR vs nursing home stay. She is to contact me once patient has decided.  - Appreciate neurology input on the care of this complex patient. Appears workup is nearing its end (see MSIV note on specifics of previous workup)  -PVR with 112 cc. Will continue without foley.  -for BM, increase miralax to TID and add metamucil.   Tana Conch, MD,  PGY2 02/28/2013 2:01 PM

## 2013-02-28 NOTE — Progress Notes (Signed)
I met with patient at bedside to discuss her rehab options. I also contacted financial counselor to assess disability and medicaid possibilities. I will follow up with pt this afternoon once I have heard from financial counselor. I have discussed with RN CM. I am not planning on admit today . [phone]

## 2013-02-28 NOTE — Progress Notes (Signed)
Patient has made good progress with therapy. I await feedback from financial counselor to determine cost of IP rehab for this pt so that she can determine IP rehab vs SNF. I have also recommended to pt to discuss with son and family to assess her ability to go live with them and have assistance once d/c'd from rehab. I will follow up in the morning. [phone]

## 2013-02-28 NOTE — Progress Notes (Signed)
FMTS Attending Daily Note: Sara Neal MD [phone] pager office [phone] I  have seen and examined this patient, reviewed their chart. I have discussed this patient with the resident. I agree with the resident's findings, assessment and care plan. 

## 2013-03-01 ENCOUNTER — Inpatient Hospital Stay (HOSPITAL_COMMUNITY)
Admission: RE | Admit: 2013-03-01 | Discharge: 2013-03-10 | DRG: 945 | Disposition: A | Discharge status: Home / Self Care | Payer: Medicaid Other | Source: Intra-hospital | Attending: Physical Medicine & Rehabilitation | Admitting: Physical Medicine & Rehabilitation

## 2013-03-01 ENCOUNTER — Encounter (HOSPITAL_COMMUNITY): Payer: Self-pay | Admitting: *Deleted

## 2013-03-01 DIAGNOSIS — A498 Other bacterial infections of unspecified site: Secondary | ICD-10-CM | POA: Diagnosis present

## 2013-03-01 DIAGNOSIS — G373 Acute transverse myelitis in demyelinating disease of central nervous system: Secondary | ICD-10-CM | POA: Diagnosis present

## 2013-03-01 DIAGNOSIS — F419 Anxiety disorder, unspecified: Secondary | ICD-10-CM | POA: Diagnosis present

## 2013-03-01 DIAGNOSIS — M4310 Spondylolisthesis, site unspecified: Secondary | ICD-10-CM | POA: Diagnosis present

## 2013-03-01 DIAGNOSIS — R259 Unspecified abnormal involuntary movements: Secondary | ICD-10-CM | POA: Diagnosis present

## 2013-03-01 DIAGNOSIS — K219 Gastro-esophageal reflux disease without esophagitis: Secondary | ICD-10-CM | POA: Diagnosis present

## 2013-03-01 DIAGNOSIS — G0489 Other myelitis: Secondary | ICD-10-CM | POA: Diagnosis present

## 2013-03-01 DIAGNOSIS — N390 Urinary tract infection, site not specified: Secondary | ICD-10-CM | POA: Diagnosis present

## 2013-03-01 DIAGNOSIS — F411 Generalized anxiety disorder: Secondary | ICD-10-CM | POA: Diagnosis present

## 2013-03-01 DIAGNOSIS — Z5189 Encounter for other specified aftercare: Principal | ICD-10-CM

## 2013-03-01 DIAGNOSIS — Z79899 Other long term (current) drug therapy: Secondary | ICD-10-CM

## 2013-03-01 DIAGNOSIS — F341 Dysthymic disorder: Secondary | ICD-10-CM

## 2013-03-01 MED ORDER — PROCHLORPERAZINE MALEATE 5 MG PO TABS
5.0000 mg | ORAL_TABLET | Freq: Four times a day (QID) | ORAL | Status: DC | PRN
  Filled 2013-03-01: qty 2

## 2013-03-01 MED ORDER — DIPHENHYDRAMINE HCL 12.5 MG/5ML PO ELIX: 12.5000 mg | ORAL_SOLUTION | Freq: Four times a day (QID) | ORAL | Status: DC | PRN

## 2013-03-01 MED ORDER — BACLOFEN 5 MG HALF TABLET: 5.0000 mg | ORAL_TABLET | Freq: Four times a day (QID) | ORAL | Status: DC

## 2013-03-01 MED ORDER — ACETAMINOPHEN 325 MG PO TABS
325.0000 mg | ORAL_TABLET | ORAL | Status: DC | PRN
  Administered 2013-03-02 – 2013-03-10 (×3): 650 mg via ORAL
  Filled 2013-03-01 (×3): qty 2

## 2013-03-01 MED ORDER — POLYETHYLENE GLYCOL 3350 17 G PO PACK: 17.0000 g | PACK | Freq: Every day | ORAL | Status: DC | PRN

## 2013-03-01 MED ORDER — PROCHLORPERAZINE EDISYLATE 5 MG/ML IJ SOLN
5.0000 mg | Freq: Four times a day (QID) | INTRAMUSCULAR | Status: DC | PRN
  Filled 2013-03-01: qty 2

## 2013-03-01 MED ORDER — PROCHLORPERAZINE 25 MG RE SUPP
12.5000 mg | Freq: Four times a day (QID) | RECTAL | Status: DC | PRN
  Filled 2013-03-01: qty 1

## 2013-03-01 MED ORDER — VITAMIN D3 25 MCG (1000 UNIT) PO TABS
1000.0000 [IU] | ORAL_TABLET | Freq: Every morning | ORAL | Status: DC
  Administered 2013-03-02 – 2013-03-10 (×9): 1000 [IU] via ORAL
  Filled 2013-03-01 (×9): qty 1

## 2013-03-01 MED ORDER — BISACODYL 10 MG RE SUPP: 10.0000 mg | Freq: Every day | RECTAL | Status: DC | PRN

## 2013-03-01 MED ORDER — POLYETHYLENE GLYCOL 3350 17 G PO PACK
17.0000 g | PACK | Freq: Two times a day (BID) | ORAL | Status: DC
  Filled 2013-03-01 (×20): qty 1

## 2013-03-01 MED ORDER — FLEET ENEMA 7-19 GM/118ML RE ENEM: 1.0000 | ENEMA | Freq: Once | RECTAL | Status: AC | PRN

## 2013-03-01 MED ORDER — SERTRALINE HCL 50 MG PO TABS
50.0000 mg | ORAL_TABLET | Freq: Every morning | ORAL | Status: DC
  Administered 2013-03-02 – 2013-03-10 (×9): 50 mg via ORAL
  Filled 2013-03-01 (×10): qty 1

## 2013-03-01 MED ORDER — TRAZODONE HCL 50 MG PO TABS: 25.0000 mg | ORAL_TABLET | Freq: Every evening | ORAL | Status: DC | PRN

## 2013-03-01 MED ORDER — ENOXAPARIN SODIUM 40 MG/0.4ML ~~LOC~~ SOLN
40.0000 mg | SUBCUTANEOUS | Status: DC
  Administered 2013-03-02 – 2013-03-10 (×9): 40 mg via SUBCUTANEOUS
  Filled 2013-03-01 (×9): qty 0.4

## 2013-03-01 MED ORDER — BACLOFEN 5 MG HALF TABLET
5.0000 mg | ORAL_TABLET | Freq: Four times a day (QID) | ORAL | Status: DC
  Administered 2013-03-01 – 2013-03-02 (×4): 5 mg via ORAL
  Administered 2013-03-02: 12:00:00 via ORAL
  Administered 2013-03-03 – 2013-03-04 (×8): 5 mg via ORAL
  Administered 2013-03-05: 22:00:00 via ORAL
  Administered 2013-03-05 – 2013-03-10 (×20): 5 mg via ORAL
  Filled 2013-03-01 (×39): qty 1

## 2013-03-01 MED ORDER — GUAIFENESIN-DM 100-10 MG/5ML PO SYRP: 5.0000 mL | ORAL_SOLUTION | Freq: Four times a day (QID) | ORAL | Status: DC | PRN

## 2013-03-01 MED ORDER — POVIDONE-IODINE 10 % EX SOLN: CUTANEOUS | Status: DC | PRN

## 2013-03-01 MED ORDER — MELOXICAM 15 MG PO TABS
15.0000 mg | ORAL_TABLET | Freq: Every day | ORAL | Status: DC
  Administered 2013-03-02 – 2013-03-10 (×9): 15 mg via ORAL
  Filled 2013-03-01 (×11): qty 1

## 2013-03-01 MED ORDER — ALUM & MAG HYDROXIDE-SIMETH 200-200-20 MG/5ML PO SUSP: 30.0000 mL | ORAL | Status: DC | PRN

## 2013-03-01 NOTE — Progress Notes (Signed)
Occupational Therapy Treatment Patient Details Name: Gail Gilbert MRN: [id number] DOB: 1951-02-24 Today's Date: 03/01/2013 Time: 5784-6962 OT Time Calculation (min): 40 min  OT Assessment / Plan / Recommendation Comments on Treatment Session Excellent session. Pt demonstrates sensory deficits in lateral aspect of BUE (R>L) and medial aspect of B thighs and total sensory deficits below knees (medial worse than lateral). Decreased proprioception BLE (distal worse that proximal). Pt does not c/o pain and burning sensation. Pt also c/o sensory changes below umbilicus (@ T10), but states chest level feels normal. Pt describes increased episodes of urinary incontinence @ night and difficulty wiith being able to tell when she has finished urinated during the day.  Did not observe any spasticity in BLE, however, significant sensory/proprioceptive defits. Pt is an excellent CIR candidate. Pt will benefit from continued skilled OT services to facilitate D/C to CIR due to below deficits.    Follow Up Recommendations  CIR    Barriers to Discharge   none    Equipment Recommendations  3 in 1 bedside comode;Tub/shower bench    Recommendations for Other Services Rehab consult  Frequency Min 3X/week   Plan Discharge plan remains appropriate;Frequency needs to be updated    Precautions / Restrictions Precautions Precautions: Fall Precaution Comments: I have told pt she is not to get to recliner or BSC unassisted Restrictions Weight Bearing Restrictions: No   Pertinent Vitals/Pain no apparent distress     ADL  Equipment Used: Gait belt;Rolling walker Transfers/Ambulation Related to ADLs: Mod A sit - stand. +2 for ambulation ADL Comments: Focus of session on neuro muscular facilitation for postural control and proximal stability. Using visual cues to assist with placement of feet during ambulation due to significant proprioceptive and sensory dysfunciton. Using tactile cues of hip on hip;foot to foot  to improve weight shifts. Used 3 musketeer technique to facilitate upright posture during ambulation    OT Diagnosis:    OT Problem List:   OT Treatment Interventions:     OT Goals Acute Rehab OT Goals OT Goal Formulation: With patient Time For Goal Achievement: 03/08/13 Potential to Achieve Goals: Good ADL Goals Pt Will Perform Grooming: with set-up;with supervision;Unsupported;Sitting, edge of bed ADL Goal: Grooming - Progress: Progressing toward goals Pt Will Transfer to Toilet: with mod assist;Stand pivot transfer;with DME;3-in-1 ADL Goal: Toilet Transfer - Progress: Progressing toward goals Miscellaneous OT Goals Miscellaneous OT Goal #1: Pt will be Mod A sit to stand from all surfaces OT Goal: Miscellaneous Goal #1 - Progress: Met Miscellaneous OT Goal #2: Pt will be able to maintain EOB dynamic balance with Min A OT Goal: Miscellaneous Goal #2 - Progress: Met  Visit Information  Last OT Received On: 03/01/13 Assistance Needed: +2 (for safety with ambulation) PT/OT Co-Evaluation/Treatment: Yes    Subjective Data      Prior Functioning  Home Living Available Help at Discharge:  (son works, dtr in Social worker in school; son's mom in Social worker available) 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    End of Session OT - End of Session Equipment Utilized During Treatment: Gait belt Activity Tolerance: Patient tolerated  treatment well Patient left: in chair;with call bell/phone within reach Nurse Communication: Mobility status  GO     Sian Joles,HILLARY 03/01/2013, 11:21 AM Luisa Dago, OTR/L  [phone] 03/01/2013

## 2013-03-01 NOTE — H&P (Signed)
Physical Medicine and Rehabilitation Admission H&P    Chief Complaint  Patient presents with  . Numbness and tingling BLE with weakness as well as tingling RUE.   HPI:  Gail Gilbert is a 62 y.o. right-handed female with history of low back pain status post lumbar L4 decompression laminectomy 1999, restless leg syndrome.. Admitted 02/21/2011 with tingling in her feet and lower extremity weakness with staggering gait as well his right upper extremity numbness and weakness. She denied any visual changes or slurred speech. Patient did note some increased urinary retention. MRI of the brain with no acute infarct or mass. There was some chronic microvascular ischemia. MRI cervical thoracic lumbar spine with no cord compression. Neurology services consulted with suspect transverse myelitis as patient was noted to have marked proximal weakness BLE with sustained clonus at ankles as well as hyperreflexia BUE with mild distal weakness R>L hand. Solu-Medrol. She was treated with IV solumedrol and baclofen was added for spasticity. LP done-CSF- glucose-100, few WBC, culture negative.   HIV, GC probe and RPR-negative, Vit B 12 levels-765, Vit D1-76, ANA-negative,  GAD<1.0, And TSH-1.001. Neurology recommended repeat MRI for follow up. MRI neck 04/13 showed abnormal signal with evidence of myelopathy in posterior and lateral columns at C3-C5 and MRI Lumbar spine with lateral stenosis L3-4, L4-5 advanced facet arthropathy with anterolisthesis 1 cm, some enhancement in L3-L4 dorsal space likely due to degenerative disease and associated epidural inflammation. Neurology feels that transverse myelitis remains in differential of etiology.    Patient has has some improvement in LE strength as well as mobility. Foley discontinued and she was noted to have problems with voiding as well as constipation. therapies ongoing and patient continues with decreased balance, impaired coordination as well as BLE instability. therapy  team recommended CIR for progression.     Review of Systems  HENT: Positive for neck pain (recent issues with neck pain).   Eyes: Negative for blurred vision and double vision.  Respiratory: Negative for shortness of breath.   Cardiovascular: Negative for chest pain.  Gastrointestinal: Negative for heartburn, nausea, vomiting and constipation.  Genitourinary: Negative for urgency and frequency.       Reports bladder problems resolved--has slight dribbling that's new.   Musculoskeletal: Positive for myalgias (chronic back pain. ).  Neurological: Positive for tingling, sensory change, focal weakness and weakness. Negative for headaches.  Psychiatric/Behavioral: The patient is nervous/anxious and has insomnia.    Past Medical History  Diagnosis Date  . Arthritis     cervical,feet  . Depression   . Migraine   . GERD (gastroesophageal reflux disease) 12/2006    esophageal stricture  . Vitamin D deficiency   . MVP (mitral valve prolapse)   . Spondylolisthesis     L4-5  . RLS (restless legs syndrome)   . Anxiety   . Interstitial cystitis   . IBS (irritable bowel syndrome)   . Insomnia   . DVT (deep vein thrombosis) in pregnancy 1989    postpartum   Past Surgical History  Procedure Laterality Date  . Colonoscopy    . Bunionectomy  09,05    both feet  . Upper gastrointestinal endoscopy    . Ear examination under anesthesia      to correct ringing in ear at [facility]  . Lumbar aspiration and drainage      cyst  . Hammer toe surgery  10/29/2011    Procedure: HAMMER TOE CORRECTION;  Surgeon: Nestor Lewandowsky;  Location: [facility];  Service: Orthopedics;  Laterality: Left;  Left 2nd toe duvries arthroplasty  . Decompressive lumbar laminectomy level 4  1999   Family History  Problem Relation Age of Onset  . Arthritis Mother     spinal stenosis  . Cataracts Mother   . Heart disease Mother     pacemaker  . Hypertension Father   . Heart disease Father      pacer/defibrillator   Social History: Single. Lives alone. 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 Has been going back and forth to [place] to help parents pack up their home. She reports that she has never smoked. She has never used smokeless tobacco. She reports that  drinks alcohol--1-2 glasses of wine with meals occasionally.  She reports that she does not use illicit drugs.  Allergies  Allergen Reactions  . Antihistamines, Diphenhydramine-Type Palpitations   Medications Prior to Admission  Medication Sig Dispense Refill  . cholecalciferol (VITAMIN D) 1000 UNITS tablet Take 1,000 Units by mouth every morning.      . fish oil-omega-3 fatty acids 1000 MG capsule Take 2 g by mouth every morning.       . Melatonin 5 MG CAPS Take 1 capsule by mouth at bedtime as needed (sleep).      . meloxicam (MOBIC) 15 MG tablet Take 15 mg by mouth every morning.      . Multiple Vitamin (MULTIVITAMIN) capsule Take 1 capsule by mouth daily.        . sertraline (ZOLOFT) 100 MG tablet Take 50 mg by mouth every morning.        Home: Home Living Lives With: Alone Available Help at Discharge: Family;Available 24 hours/day Type of Home: Apartment Home Access: Stairs to enter [employer] of Steps: 10 Entrance Stairs-Rails: Right Home Layout: One level Bathroom Shower/Tub: Engineer, manufacturing systems: Standard Bathroom Accessibility: Yes How Accessible: Accessible via walker Additional Comments: can stay with son who has 1 level, 1 STE, combo shower/tub with curtain   Functional History: Prior Function Able to Take Stairs?: Yes Driving: Yes Vocation: Unemployed Comments: glasses  Functional Status:  Mobility: Bed Mobility Bed Mobility: Supine to Sit;Sitting - Scoot to Edge of Bed Rolling Left: 4: Min assist;With rail Left Sidelying to Sit: 4: Min assist;With rails;HOB flat Supine to Sit: 4: Min assist Sitting - Scoot to [place] of Bed: 4: Min assist Transfers Transfers:  Sit to Stand;Stand to Sit;Squat Pivot Transfers Sit to Stand: 3: Mod assist;From chair/3-in-1;With upper extremity assist;With armrests Sit to Stand: Patient Percentage: 60% Stand to Sit: 3: Mod assist;With armrests;With upper extremity assist;To chair/3-in-1 Stand to Sit: Patient Percentage: 50% Stand Pivot Transfers: 1: +2 Total assist Stand Pivot Transfers: Patient Percentage: 40% Squat Pivot Transfers: 4: Min guard Ambulation/Gait Ambulation/Gait Assistance: 3: Mod assist Ambulation/Gait: Patient Percentage: 70% Ambulation Distance (Feet): 35 Feet Assistive device: Rolling walker Ambulation/Gait Assistance Details: VC's for sequencing and multimodal cues to assist with placing in order to facilitate advancement over LE. Patient very uncoordinated and relies on visual feedback to improve positioning. Gait Pattern: Ataxic;Scissoring;Step-through pattern;Narrow base of support;Right genu recurvatum;Left genu recurvatum;Right steppage;Left steppage Gait velocity: Decreased General Gait Details: Pt with multiple balance checks and 2 noted LOb requiring full assist to prevent fall. Pt would benefit from continued training to improve activation and coordination with gait stability. Stairs: No Wheelchair Mobility Wheelchair Mobility: No  ADL: ADL Eating/Feeding: Independent Where Assessed - Eating/Feeding: Bed level Grooming: Moderate assistance Where Assessed - Grooming: Unsupported sitting Upper Body Bathing: Moderate assistance Where Assessed -  Upper Body Bathing: Unsupported sitting Lower Body Bathing: Maximal assistance Where Assessed - Lower Body Bathing: Supported sit to stand (+2 total A (pt=50%) for sit to stand and maintain standing) Upper Body Dressing: Moderate assistance Where Assessed - Upper Body Dressing: Unsupported sitting Lower Body Dressing: +1 Total assistance Where Assessed - Lower Body Dressing: Supported sit to stand (+2 total A (pt=50%) for sit to stand and  maintain standing) Toilet Transfer: +2 Total assistance Toilet Transfer Method: Stand pivot Toilet Transfer Equipment: Bedside commode Equipment Used: Gait belt Transfers/Ambulation Related to ADLs: Total A +2 (pt=50%) for sit to stand, stand to sit, stand pivot  Cognition: Cognition Arousal/Alertness: Awake/alert Orientation Level: Oriented X4 Cognition Overall Cognitive Status: Appears within functional limits for tasks assessed/performed Arousal/Alertness: Awake/alert Orientation Level: Appears intact for tasks assessed Behavior During Session: [facility] for tasks performed Cognition - Other Comments: delayed processing  Physical Exam: Blood pressure 102/53, pulse 72, temperature 98.4 F (36.9 C), temperature source Oral, resp. rate 18, height 5\' 8"  (1.727 m), weight 61.689 kg (136 lb), SpO2 97.00%. Physical Exam  Nursing note and vitals reviewed. Constitutional: She is oriented to person, place, and time. She appears well-developed and well-nourished.  HENT:  Head: Normocephalic and atraumatic.  Eyes: Pupils are equal, round, and reactive to light.  Neck: Normal range of motion. Neck supple.  Cardiovascular: Normal rate and regular rhythm.   Pulmonary/Chest: Effort normal and breath sounds normal.  Abdominal: Soft. Bowel sounds are normal. She exhibits no distension. There is no tenderness.  Musculoskeletal: She exhibits no edema and no tenderness.  Neurological: She is alert and oriented to person, place, and time.  BLE with ataxia and hyper reflexia. Decreased sensation RLE>LLE. Impaired proprioception. Impaired sensation BLE below trunk. Mild sensory loss in hands. UE strength grossly 5/5 except for HI which were a little weak (4/5).  LE 4/5 proximal to distal.   Skin: Skin is warm and dry.  Psychiatric: She has a normal mood and affect. Her behavior is normal. Judgment and thought content normal.    No results found for this or any previous visit (from the past 48  hour(s)). No results found.  Post Admission Physician Evaluation: 1. Functional deficits secondary  to cervico-thoracic transverse myelitis. 2. Patient is admitted to receive collaborative, interdisciplinary care between the physiatrist, rehab nursing staff, and therapy team. 3. Patient's level of medical complexity and substantial therapy needs in context of that medical necessity cannot be provided at a lesser intensity of care such as a SNF. 4. Patient has experienced substantial functional loss from his/her baseline which was documented above under the "Functional History" and "Functional Status" headings.  Judging by the patient's diagnosis, physical exam, and functional history, the patient has potential for functional progress which will result in measurable gains while on inpatient rehab.  These gains will be of substantial and practical use upon discharge  in facilitating mobility and self-care at the household level. 5. Physiatrist will provide 24 hour management of medical needs as well as oversight of the therapy plan/treatment and provide guidance as appropriate regarding the interaction of the two. 6. 24 hour rehab nursing will assist with bladder management, bowel management, safety, skin/wound care, disease management, medication administration, pain management and patient education  and help integrate therapy concepts, techniques,education, etc. 7. PT will assess and treat for/with: Lower extremity strength, range of motion, stamina, balance, functional mobility, safety, adaptive techniques and equipment, NMR, pain mgt. Goals are: supervision to mod I. 8. OT will assess and treat for/with:  ADL's, functional mobility, safety, upper extremity strength, adaptive techniques and equipment, NMR, pain mgt.   Goals are: mod I to supervision. 9. SLP will assess and treat for/with: n/a.  Goals are: n/a. 10. Case Management and Social Worker will assess and treat for psychological issues and  discharge planning. 11. Team conference will be held weekly to assess progress toward goals and to determine barriers to discharge. 12. Patient will receive at least 3 hours of therapy per day at least 5 days per week. 13. ELOS: 10 days      Prognosis:  excellent   Medical Problem List and Plan: 1. DVT Prophylaxis/Anticoagulation: Pharmaceutical: Lovenox 2. Pain Management: continue mobic for acute on chronic back pain.  3. Mood:  Continue zoloft for history of depression. Will discontinue Ambien and use trazodone prn for bouts of insomnia. LCSW to follow for formal evaluation.  4. Neuropsych: This patient is capable of making decisions on his/her own behalf. 5. GU:  Reports symptoms resolved. Will check PVRs. Check UA/UCS. Continue miralax.  6. Spasticity: continue baclofen.   Ranelle Oyster, MD, [place] [facility]    03/01/2013

## 2013-03-01 NOTE — Progress Notes (Signed)
Subjective: Patient stable.  Continues to work with therapy  Objective: Current vital signs: BP 102/53  Pulse 72  Temp(Src) 98.4 F (36.9 C) (Oral)  Resp 18  Ht 5\' 8"  (1.727 m)  Wt 61.689 kg (136 lb)  BMI 20.68 kg/m2  SpO2 97% Vital signs in last 24 hours: Temp:  [98.4 F (36.9 C)-98.7 F (37.1 C)] 98.4 F (36.9 C) (03/18 0540) Pulse Rate:  [72] 72 (03/18 0540) Resp:  [16-18] 18 (03/18 0540) BP: (102-119)/(53-67) 102/53 mmHg (03/18 0540) SpO2:  [93 %-97 %] 97 % (03/18 0540)  Intake/Output from previous day: 03/17 0701 - 03/18 0700 In: 1080 [P.O.:1080] Out: -  Intake/Output this shift:   Nutritional status: General  Neurologic Exam: Mental Status:  Alert, oriented, thought content appropriate. Speech fluent without evidence of aphasia. Able to follow 3 step commands without difficulty.  Cranial Nerves:  II: Discs flat bilaterally; Visual fields grossly normal, pupils equal, round, reactive to light and accommodation  III,IV, VI: ptosis not present, extra-ocular motions intact bilaterally  V,VII: smile symmetric, facial light touch sensation normal bilaterally  VIII: hearing normal bilaterally  IX,X: gag reflex present  XI: bilateral shoulder shrug  XII: midline tongue extension  Motor:  Right : Upper extremity 5/5        Left: Upper extremity 5/5   Lower extremity 4-/5 proximally    Lower extremity 4-/5 proximally  Lower extremities are 5/5 distally  Tone and bulk:normal tone throughout; no atrophy noted  Sensory: Pinprick and light touch decreased in the lower extremities and in the ulnar aspect of the RUE  Deep Tendon Reflexes: 3+ and symmetric throughout  Plantars:  Right: upgoing    Left: upgoing  Cerebellar:  normal finger-to-nose, normal rapid alternating movements and normal heel-to-shin test  Gait: unable to be tested  CV: pulses palpable throughout    Lab Results: Basic Metabolic Panel:  Recent Labs Lab 02/25/13 1733  CREATININE 0.61    Liver  Function Tests: No results found for this basename: AST, ALT, ALKPHOS, BILITOT, PROT, ALBUMIN,  in the last 168 hours No results found for this basename: LIPASE, AMYLASE,  in the last 168 hours No results found for this basename: AMMONIA,  in the last 168 hours  CBC: No results found for this basename: WBC, NEUTROABS, HGB, HCT, MCV, PLT,  in the last 168 hours  Cardiac Enzymes: No results found for this basename: CKTOTAL, CKMB, CKMBINDEX, TROPONINI,  in the last 168 hours  Lipid Panel: No results found for this basename: CHOL, TRIG, HDL, CHOLHDL, VLDL, LDLCALC,  in the last 168 hours  CBG: No results found for this basename: GLUCAP,  in the last 168 hours  Microbiology: Results for orders placed during the hospital encounter of 02/19/13  CSF CULTURE     Status: None   Collection Time    02/21/13  4:09 PM      Result Value Range Status   Specimen Description CSF   Final   Special Requests NONE   Final   Gram Stain     Final   Value: WBC PRESENT, PREDOMINANTLY MONONUCLEAR     NO ORGANISMS SEEN     CYTOSPUN Performed at [facility]   Culture NO GROWTH 3 DAYS   Final   Report Status 02/25/2013 FINAL   Final  GRAM STAIN     Status: None   Collection Time    02/21/13  4:10 PM      Result Value Range Status   Specimen Description  CSF   Final   Special Requests NONE   Final   Gram Stain     Final   Value: CYTOSPIN SLIDE     WBC PRESENT, PREDOMINANTLY MONONUCLEAR     NO ORGANISMS SEEN   Report Status 02/21/2013 FINAL   Final  FUNGUS CULTURE W SMEAR     Status: None   Collection Time    02/21/13  4:11 PM      Result Value Range Status   Specimen Description CSF   Final   Special Requests NONE   Final   Fungal Smear NO YEAST OR FUNGAL ELEMENTS SEEN   Final   Culture CULTURE IN PROGRESS FOR FOUR WEEKS   Final   Report Status PENDING   Incomplete  GC/CHLAMYDIA PROBE AMP     Status: None   Collection Time    02/23/13  8:39 PM      Result Value Range Status   CT Probe  RNA NEGATIVE  NEGATIVE Final   GC Probe RNA NEGATIVE  NEGATIVE Final   Comment: (NOTE)                                                                                              Normal Reference Range: Negative          Assay performed using the Gen-Probe APTIMA COMBO2 (R) Assay.     Acceptable specimen types for this assay include APTIMA Swabs (Unisex,     endocervical, urethral, or vaginal), first void urine, and ThinPrep     liquid based cytology samples.    Coagulation Studies: No results found for this basename: LABPROT, INR,  in the last 72 hours  Imaging: No results found.  Medications:  I have reviewed the patient's current medications. Scheduled: . baclofen  5 mg Oral QID  . cholecalciferol  1,000 Units Oral q morning - 10a  . enoxaparin (LOVENOX) injection  40 mg Subcutaneous Q24H  . meloxicam  15 mg Oral Daily  . polyethylene glycol  17 g Oral Daily  . sertraline  50 mg Oral q morning - 10a  . sodium chloride  3 mL Intravenous Q12H    Assessment/Plan: Lower extremity weakness   Assessment: Likely secondary to a presumed transverse myelitis.  Has received steroids.  Working with therapy.  Work up to date unremarkable with only GM1 pending.  Patient scheduled to go to CIR for further therapy.   Plan:  No further neurologic intervention is recommended at this time.    Thana Farr, MD [facility] [phone] 03/01/2013  3:34 PM    LOS: 10 days   Thana Farr, MD [facility] [phone] 03/01/2013  3:30 PM

## 2013-03-01 NOTE — H&P (View-Only) (Signed)
Physical Medicine and Rehabilitation Admission H&P    Chief Complaint  Patient presents with  . Numbness and tingling BLE with weakness as well as tingling RUE.   HPI:  Gail Gilbert is a 62 y.o. right-handed female with history of low back pain status post lumbar L4 decompression laminectomy 1999, restless leg syndrome.. Admitted 02/21/2011 with tingling in her feet and lower extremity weakness with staggering gait as well his right upper extremity numbness and weakness. She denied any visual changes or slurred speech. Patient did note some increased urinary retention. MRI of the brain with no acute infarct or mass. There was some chronic microvascular ischemia. MRI cervical thoracic lumbar spine with no cord compression. Neurology services consulted with suspect transverse myelitis as patient was noted to have marked proximal weakness BLE with sustained clonus at ankles as well as hyperreflexia BUE with mild distal weakness R>L hand. Solu-Medrol. She was treated with IV solumedrol and baclofen was added for spasticity. LP done-CSF- glucose-100, few WBC, culture negative.   HIV, GC probe and RPR-negative, Vit B 12 levels-765, Vit D1-76, ANA-negative,  GAD<1.0, And TSH-1.001. Neurology recommended repeat MRI for follow up. MRI neck 04/13 showed abnormal signal with evidence of myelopathy in posterior and lateral columns at C3-C5 and MRI Lumbar spine with lateral stenosis L3-4, L4-5 advanced facet arthropathy with anterolisthesis 1 cm, some enhancement in L3-L4 dorsal space likely due to degenerative disease and associated epidural inflammation. Neurology feels that transverse myelitis remains in differential of etiology.    Patient has has some improvement in LE strength as well as mobility. Foley discontinued and she was noted to have problems with voiding as well as constipation. therapies ongoing and patient continues with decreased balance, impaired coordination as well as BLE instability. therapy  team recommended CIR for progression.     Review of Systems  HENT: Positive for neck pain (recent issues with neck pain).   Eyes: Negative for blurred vision and double vision.  Respiratory: Negative for shortness of breath.   Cardiovascular: Negative for chest pain.  Gastrointestinal: Negative for heartburn, nausea, vomiting and constipation.  Genitourinary: Negative for urgency and frequency.       Reports bladder problems resolved--has slight dribbling that's new.   Musculoskeletal: Positive for myalgias (chronic back pain. ).  Neurological: Positive for tingling, sensory change, focal weakness and weakness. Negative for headaches.  Psychiatric/Behavioral: The patient is nervous/anxious and has insomnia.    Past Medical History  Diagnosis Date  . Arthritis     cervical,feet  . Depression   . Migraine   . GERD (gastroesophageal reflux disease) 12/2006    esophageal stricture  . Vitamin D deficiency   . MVP (mitral valve prolapse)   . Spondylolisthesis     L4-5  . RLS (restless legs syndrome)   . Anxiety   . Interstitial cystitis   . IBS (irritable bowel syndrome)   . Insomnia   . DVT (deep vein thrombosis) in pregnancy 1989    postpartum   Past Surgical History  Procedure Laterality Date  . Colonoscopy    . Bunionectomy  09,05    both feet  . Upper gastrointestinal endoscopy    . Ear examination under anesthesia      to correct ringing in ear at [facility]  . Lumbar aspiration and drainage      cyst  . Hammer toe surgery  10/29/2011    Procedure: HAMMER TOE CORRECTION;  Surgeon: Frank J Rowan;  Location: [facility];  Service: Orthopedics;    Laterality: Left;  Left 2nd toe duvries arthroplasty  . Decompressive lumbar laminectomy level 4  1999   Family History  Problem Relation Age of Onset  . Arthritis Mother     spinal stenosis  . Cataracts Mother   . Heart disease Mother     pacemaker  . Hypertension Father   . Heart disease Father      pacer/defibrillator   Social History: Single. Lives alone. 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 Has been going back and forth to [place] to help parents pack up their home. She reports that she has never smoked. She has never used smokeless tobacco. She reports that  drinks alcohol--1-2 glasses of wine with meals occasionally.  She reports that she does not use illicit drugs.  Allergies  Allergen Reactions  . Antihistamines, Diphenhydramine-Type Palpitations   Medications Prior to Admission  Medication Sig Dispense Refill  . cholecalciferol (VITAMIN D) 1000 UNITS tablet Take 1,000 Units by mouth every morning.      . fish oil-omega-3 fatty acids 1000 MG capsule Take 2 g by mouth every morning.       . Melatonin 5 MG CAPS Take 1 capsule by mouth at bedtime as needed (sleep).      . meloxicam (MOBIC) 15 MG tablet Take 15 mg by mouth every morning.      . Multiple Vitamin (MULTIVITAMIN) capsule Take 1 capsule by mouth daily.        . sertraline (ZOLOFT) 100 MG tablet Take 50 mg by mouth every morning.        Home: Home Living Lives With: Alone Available Help at Discharge: Family;Available 24 hours/day Type of Home: Apartment Home Access: Stairs to enter Entrance Stairs-Number of Steps: 10 Entrance Stairs-Rails: Right Home Layout: One level Bathroom Shower/Tub: Tub/shower unit Bathroom Toilet: Standard Bathroom Accessibility: Yes How Accessible: Accessible via walker Additional Comments: can stay with son who has 1 level, 1 STE, combo shower/tub with curtain   Functional History: Prior Function Able to Take Stairs?: Yes Driving: Yes Vocation: Unemployed Comments: glasses  Functional Status:  Mobility: Bed Mobility Bed Mobility: Supine to Sit;Sitting - Scoot to Edge of Bed Rolling Left: 4: Min assist;With rail Left Sidelying to Sit: 4: Min assist;With rails;HOB flat Supine to Sit: 4: Min assist Sitting - Scoot to Edge of Bed: 4: Min assist Transfers Transfers:  Sit to Stand;Stand to Sit;Squat Pivot Transfers Sit to Stand: 3: Mod assist;From chair/3-in-1;With upper extremity assist;With armrests Sit to Stand: Patient Percentage: 60% Stand to Sit: 3: Mod assist;With armrests;With upper extremity assist;To chair/3-in-1 Stand to Sit: Patient Percentage: 50% Stand Pivot Transfers: 1: +2 Total assist Stand Pivot Transfers: Patient Percentage: 40% Squat Pivot Transfers: 4: Min guard Ambulation/Gait Ambulation/Gait Assistance: 3: Mod assist Ambulation/Gait: Patient Percentage: 70% Ambulation Distance (Feet): 35 Feet Assistive device: Rolling walker Ambulation/Gait Assistance Details: VC's for sequencing and multimodal cues to assist with placing in order to facilitate advancement over LE. Patient very uncoordinated and relies on visual feedback to improve positioning. Gait Pattern: Ataxic;Scissoring;Step-through pattern;Narrow base of support;Right genu recurvatum;Left genu recurvatum;Right steppage;Left steppage Gait velocity: Decreased General Gait Details: Pt with multiple balance checks and 2 noted LOb requiring full assist to prevent fall. Pt would benefit from continued training to improve activation and coordination with gait stability. Stairs: No Wheelchair Mobility Wheelchair Mobility: No  ADL: ADL Eating/Feeding: Independent Where Assessed - Eating/Feeding: Bed level Grooming: Moderate assistance Where Assessed - Grooming: Unsupported sitting Upper Body Bathing: Moderate assistance Where Assessed -   Upper Body Bathing: Unsupported sitting Lower Body Bathing: Maximal assistance Where Assessed - Lower Body Bathing: Supported sit to stand (+2 total A (pt=50%) for sit to stand and maintain standing) Upper Body Dressing: Moderate assistance Where Assessed - Upper Body Dressing: Unsupported sitting Lower Body Dressing: +1 Total assistance Where Assessed - Lower Body Dressing: Supported sit to stand (+2 total A (pt=50%) for sit to stand and  maintain standing) Toilet Transfer: +2 Total assistance Toilet Transfer Method: Stand pivot Toilet Transfer Equipment: Bedside commode Equipment Used: Gait belt Transfers/Ambulation Related to ADLs: Total A +2 (pt=50%) for sit to stand, stand to sit, stand pivot  Cognition: Cognition Arousal/Alertness: Awake/alert Orientation Level: Oriented X4 Cognition Overall Cognitive Status: Appears within functional limits for tasks assessed/performed Arousal/Alertness: Awake/alert Orientation Level: Appears intact for tasks assessed Behavior During Session: WFL for tasks performed Cognition - Other Comments: delayed processing  Physical Exam: Blood pressure 102/53, pulse 72, temperature 98.4 F (36.9 C), temperature source Oral, resp. rate 18, height 5' 8" (1.727 m), weight 61.689 kg (136 lb), SpO2 97.00%. Physical Exam  Nursing note and vitals reviewed. Constitutional: She is oriented to person, place, and time. She appears well-developed and well-nourished.  HENT:  Head: Normocephalic and atraumatic.  Eyes: Pupils are equal, round, and reactive to light.  Neck: Normal range of motion. Neck supple.  Cardiovascular: Normal rate and regular rhythm.   Pulmonary/Chest: Effort normal and breath sounds normal.  Abdominal: Soft. Bowel sounds are normal. She exhibits no distension. There is no tenderness.  Musculoskeletal: She exhibits no edema and no tenderness.  Neurological: She is alert and oriented to person, place, and time.  BLE with ataxia and hyper reflexia. Decreased sensation RLE>LLE. Impaired proprioception. Impaired sensation BLE below trunk. Mild sensory loss in hands. UE strength grossly 5/5 except for HI which were a little weak (4/5).  LE 4/5 proximal to distal.   Skin: Skin is warm and dry.  Psychiatric: She has a normal mood and affect. Her behavior is normal. Judgment and thought content normal.    No results found for this or any previous visit (from the past 48  hour(s)). No results found.  Post Admission Physician Evaluation: 1. Functional deficits secondary  to cervico-thoracic transverse myelitis. 2. Patient is admitted to receive collaborative, interdisciplinary care between the physiatrist, rehab nursing staff, and therapy team. 3. Patient's level of medical complexity and substantial therapy needs in context of that medical necessity cannot be provided at a lesser intensity of care such as a SNF. 4. Patient has experienced substantial functional loss from his/her baseline which was documented above under the "Functional History" and "Functional Status" headings.  Judging by the patient's diagnosis, physical exam, and functional history, the patient has potential for functional progress which will result in measurable gains while on inpatient rehab.  These gains will be of substantial and practical use upon discharge  in facilitating mobility and self-care at the household level. 5. Physiatrist will provide 24 hour management of medical needs as well as oversight of the therapy plan/treatment and provide guidance as appropriate regarding the interaction of the two. 6. 24 hour rehab nursing will assist with bladder management, bowel management, safety, skin/wound care, disease management, medication administration, pain management and patient education  and help integrate therapy concepts, techniques,education, etc. 7. PT will assess and treat for/with: Lower extremity strength, range of motion, stamina, balance, functional mobility, safety, adaptive techniques and equipment, NMR, pain mgt. Goals are: supervision to mod I. 8. OT will assess and treat for/with:   ADL's, functional mobility, safety, upper extremity strength, adaptive techniques and equipment, NMR, pain mgt.   Goals are: mod I to supervision. 9. SLP will assess and treat for/with: n/a.  Goals are: n/a. 10. Case Management and Social Worker will assess and treat for psychological issues and  discharge planning. 11. Team conference will be held weekly to assess progress toward goals and to determine barriers to discharge. 12. Patient will receive at least 3 hours of therapy per day at least 5 days per week. 13. ELOS: 10 days      Prognosis:  excellent   Medical Problem List and Plan: 1. DVT Prophylaxis/Anticoagulation: Pharmaceutical: Lovenox 2. Pain Management: continue mobic for acute on chronic back pain.  3. Mood:  Continue zoloft for history of depression. Will discontinue Ambien and use trazodone prn for bouts of insomnia. LCSW to follow for formal evaluation.  4. Neuropsych: This patient is capable of making decisions on his/her own behalf. 5. GU:  Reports symptoms resolved. Will check PVRs. Check UA/UCS. Continue miralax.  6. Spasticity: continue baclofen.   Zachary T. Swartz, MD, FAAPMR    03/01/2013 

## 2013-03-01 NOTE — Discharge Summary (Signed)
Family Medicine Teaching Service  Discharge Note : Attending Edelin Fryer MD Pager [phone] Office [phone] I have seen and examined this patient, reviewed their chart and discussed discharge planning wit the resident at the time of discharge. I agree with the discharge plan as above.  

## 2013-03-01 NOTE — Progress Notes (Signed)
Discussed with patient and with financial counselor. Disability and medicaid applications will be initiated. We will admit pt to IP rehab today. I have notified teaching service and RN CM. [phone]

## 2013-03-01 NOTE — Progress Notes (Signed)
FMTS Attending Daily Note: Sara Neal MD [phone] pager office [phone] I have discussed this patient with the resident and reviewed the assessment and plan as documented above. I agree wit the resident's findings and plan.  

## 2013-03-01 NOTE — Plan of Care (Signed)
Overall Plan of Care [facility]) Patient Details Name: Gail Gilbert MRN: [id number] DOB: 29-Jul-1951  Diagnosis:  Transverse myelitis  Co-morbidities: anxiety, UTI, spasticity  Functional Problem List  Patient demonstrates impairments in the following areas: Balance, Bladder, Bowel, Cognition, Endurance, Medication Management, Motor, Nutrition, Pain, Safety, Sensory  and Skin Integrity  Basic ADL's: grooming, bathing, dressing and toileting Advanced ADL's: simple meal preparation and light housekeeping  Transfers:  bed mobility, bed to chair, toilet, tub/shower, car and furniture Locomotion:  ambulation, wheelchair mobility and stairs  Additional Impairments:  Leisure Awareness  Anticipated Outcomes Item Anticipated Outcome  Eating/Swallowing  independent  Basic self-care  Mod I  Tolieting  Mod I  Bowel/Bladder  Continent with toileting  Transfers  Mod I basic; S car  Locomotion  Mod I w/c level; S gait and stairs  Communication    Cognition    Pain  Pain will be managed at a less than 3 on a scale of 1/10  Safety/Judgment  Supervision  Other     Therapy Plan: PT Intensity: Minimum of 1-2 x/day ,45 to 90 minutes PT Frequency: 5 out of 7 days PT Duration Estimated Length of Stay: 10 days OT Intensity: Minimum of 1-2 x/day, 45 to 90 minutes OT Frequency: 5 out of 7 days OT Duration/Estimated Length of Stay: 7-10 days      Team Interventions: Item RN PT OT SLP SW TR Other  Self Care/Advanced ADL Retraining  x x      Neuromuscular Re-Education  x x      Therapeutic Activities  x x      UE/LE Strength Training/ROM  x x      UE/LE Coordination Activities  x x      Visual/Perceptual Remediation/Compensation         DME/Adaptive Equipment Instruction  x x      Therapeutic Exercise  x x      Balance/Vestibular Training  x x      Patient/Family Education  x x      Cognitive Remediation/Compensation         Functional Mobility Training  x x      Ambulation/Gait  Training  x x      Stair Training  x       Wheelchair Propulsion/Positioning  x x      Functional Statistician  x       Community Reintegration  x x      Dysphagia/Aspiration Film/video editor         Bladder Management x        Bowel Management x        Disease Management/Prevention  x       Pain Management x x x      Medication Management x        Skin Care/Wound Management x x x      Splinting/Orthotics  x x      Discharge Planning  x x      Psychosocial Support  x x                             Team Discharge Planning: Destination: PT-Home (son and daughter in law's house) ,OT- Home (son and daughter in law's house) , SLP-  Projected Follow-up: PT-Outpatient PT, OT-  None, SLP-  Projected Equipment Needs: PT-Wheelchair (measurements);Wheelchair cushion (measurements);Rolling walker with 5" wheels, OT- 3 in  1 bedside comode;Tub/shower bench, SLP-  Patient/family involved in discharge planning: PT- Patient,  OT-Patient, SLP-   MD ELOS: 8-10 days Medical Rehab Prognosis:  Excellent Assessment: The patient has been admitted for CIR therapies. The team will be addressing, functional mobility, strength, stamina, balance, safety, adaptive techniques/equipment, self-care, bowel and bladder mgt, patient and caregiver education, NMR, pain mgt, coping skills. Goals have been set at mod I.      See Team Conference Notes for weekly updates to the plan of care

## 2013-03-01 NOTE — PMR Pre-admission (Signed)
PMR Admission Coordinator Pre-Admission Assessment  Patient: Gail Gilbert is an 62 y.o., female MRN: [id number] DOB: 21-Jun-1951 Height: 5\' 8"  (172.7 cm) Weight: 61.689 kg (136 lb)              Insurance Information   PRIMARY: self pay     I have spoken with Tamala Bari, financial counselor on 02/28/13. She will assist pt to apply for disability and Medicaid. Patient has $9000 that she is transferring funds to son, Jonny Ruiz , on 03/01/13. Patient had applied for [facility] online a few months ago. She will follow up with that application. Medicaid Application Date: tba      Case Manager:  Disability Application Date: tba      Case Worker:   Emergency Contact Information Contact Information   Name Relation Home Work Mobile   Douglas Son   [phone]   Tiburcio Pea   [phone]     Current Medical History  Patient Admitting Diagnosis: thoracic transverse myelitis History of Present Illness: Gail Gilbert is a 62 y.o. right-handed female with history of low back pain status post lumbar L4 decompression laminectomy 1999, restless leg syndrome.. Admitted 02/21/2011 with tingling in her feet and lower extremity weakness with staggering gait as well his right upper extremity numbness and weakness. She denied any visual changes or slurred speech. Patient did note some increased urinary retention. MRI of the brain with no acute infarct or mass. There was some chronic microvascular ischemia. MRI cervical thoracic lumbar spine with no cord compression.  Neurology services consulted with suspect transverse myelitis and placed on intravenous Solu-Medrol. Patient reports that she feels that she is moving her legs better. MRI of the lumbar spine with contrast shows some enhancement likely related to her lumbar disc disease. Vitamin D, GM1 and anti GAD antibodies remain pending. Patient shows some improvement in her lower extremity strength. Do not feel that further imaging is necessary. Cervical  abnormalities are unusual in character on imaging. Transverse myelitis remains on the differential despite the fact that there was no evidence of inflammation on LP. Patient has been treated with steroids. Would continue with therapy.  Recommendations:  1. Would continue with therapy  2. Will continue to follow up results of outstanding lab work.   Past Medical History  Past Medical History  Diagnosis Date  . Arthritis     cervical,feet  . Depression   . Migraine   . GERD (gastroesophageal reflux disease) 12/2006    esophageal stricture  . Vitamin D deficiency   . MVP (mitral valve prolapse)   . Spondylolisthesis     L4-5  . RLS (restless legs syndrome)   . Anxiety   . Interstitial cystitis   . IBS (irritable bowel syndrome)   . Insomnia   . DVT (deep vein thrombosis) in pregnancy 1989    postpartum    Family History  family history includes Arthritis in her mother; Cataracts in her mother; Heart disease in her father and mother; and Hypertension in her father.  Prior Rehab/Hospitalizations:none   Current Medications  Current facility-administered medications:baclofen (LIORESAL) tablet 5 mg, 5 mg, Oral, QID, Noel Christmas, 5 mg at 03/01/13 1046;  cholecalciferol (VITAMIN D) tablet 1,000 Units, 1,000 Units, Oral, q morning - 10a, Josalyn Funches, MD, 1,000 Units at 03/01/13 1046;  enoxaparin (LOVENOX) injection 40 mg, 40 mg, Subcutaneous, Q24H, Shelva Majestic, MD, 40 mg at 03/01/13 1050 meloxicam (MOBIC) tablet 15 mg, 15 mg, Oral, Daily, Dessa Phi, MD, 15 mg at 03/01/13  1046;  polyethylene glycol (MIRALAX / GLYCOLAX) packet 17 g, 17 g, Oral, Daily, Jayce G Cook, DO, 17 g at 03/01/13 1047;  povidone-iodine (BETADINE) 10 % external solution, , Topical, PRN, Sanjuana Letters, MD;  sertraline (ZOLOFT) tablet 50 mg, 50 mg, Oral, q morning - 10a, Hilarie Fredrickson, MD, 50 mg at 03/01/13 1046 sodium chloride 0.9 % injection 3 mL, 3 mL, Intravenous, Q12H, Josalyn Funches, MD, 3  mL at 02/28/13 2149;  zolpidem (AMBIEN) tablet 5 mg, 5 mg, Oral, QHS PRN, Shelva Majestic, MD, 5 mg at 02/28/13 2145  Patients Current Diet: General  Precautions / Restrictions Precautions Precautions: Fall Precaution Comments: I have told pt she is not to get to recliner or BSC unassisted Restrictions Weight Bearing Restrictions: No   Prior Activity Level Community (5-7x/wk): independent and active Patient was with her parents in [place] for a time as their caregiver in the past two years. Also working through her divorce.  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) Comments: was in [place] assisting with the care of her parents; Had lived with another son and his wife for short period pta  Current Functional Level Cognition  Arousal/Alertness: Awake/alert Overall Cognitive Status: Appears within functional limits for tasks assessed/performed Orientation Level: Oriented X4 Cognition - Other Comments: delayed processing    Extremity Assessment (includes Sensation/Coordination)  RUE ROM/Strength/Tone: Within functional levels RUE Sensation: Deficits RUE Sensation Deficits: mild numbness in R medial forearm and R 5th digit/lateral hand RUE Coordination: WFL - gross/fine motor  RLE ROM/Strength/Tone: Deficits RLE ROM/Strength/Tone Deficits: grossly 3+/5 RLE Sensation: Deficits RLE Sensation Deficits: can feel pain, but minimal light touch RLE Coordination: Deficits RLE Coordination Deficits: pt ataxic, poor motor planning/control    ADLs  Eating/Feeding: Independent Where Assessed - Eating/Feeding: Bed level Grooming: Moderate assistance Where Assessed - Grooming: Unsupported sitting Upper Body Bathing: Moderate assistance Where Assessed - Upper Body Bathing: Unsupported  sitting Lower Body Bathing: Maximal assistance Where Assessed - Lower Body Bathing: Supported sit to stand (+2 total A (pt=50%) for sit to stand and maintain standing) Upper Body Dressing: Moderate assistance Where Assessed - Upper Body Dressing: Unsupported sitting Lower Body Dressing: +1 Total assistance Where Assessed - Lower Body Dressing: Supported sit to stand (+2 total A (pt=50%) for sit to stand and maintain standing) Toilet Transfer: +2 Total assistance Toilet Transfer: Patient Percentage: 40% Toilet Transfer Method: Stand pivot Acupuncturist: Materials engineer and Hygiene: Performed;+1 Total assistance Where Assessed - Glass blower/designer Manipulation and Hygiene: Standing (With additionall +1 for maintaining standing) Equipment Used: Gait belt;Rolling walker Transfers/Ambulation Related to ADLs: Mod A sit - stand. +2 for ambulation ADL Comments: Focus of session on neuro muscular facilitation for postural control and proximal stability. Using visual cues to assist with placement of feet during ambulation due to significant proprioceptive and sensory dysfunciton. Using tactile cues of hip on hip;foot to foot to improve weight shifts. Used 3 musketeer technique to     Mobility  Bed Mobility: Supine to Sit;Sitting - Scoot to Edge of Bed Rolling Left: 4: Min assist;With rail Left Sidelying to Sit: 4: Min assist;With rails;HOB flat Supine to Sit: 4: Min assist Sitting - Scoot to [place] of Bed: 4: Min assist    Transfers  Transfers: Sit to Stand;Stand to Sit;Squat Pivot Transfers Sit to Stand:  3: Mod assist;From chair/3-in-1;With upper extremity assist;With armrests Sit to Stand: Patient Percentage: 60% Stand to Sit: 3: Mod assist;With armrests;With upper extremity assist;To chair/3-in-1 Stand to Sit: Patient Percentage: 50% Stand Pivot Transfers: 1: +2 Total assist Stand Pivot Transfers: Patient Percentage: 40% Squat Pivot Transfers: 4:  Min guard    Ambulation / Gait / Stairs / Psychologist, prison and probation services  Ambulation/Gait Ambulation/Gait Assistance: 3: Mod assist Ambulation/Gait: Patient Percentage: 70% Ambulation Distance (Feet): 35 Feet Assistive device: Rolling walker Ambulation/Gait Assistance Details: VC's for sequencing and multimodal cues to assist with placing in order to facilitate advancement over LE. Patient very uncoordinated and relies on visual feedback to improve positioning. Gait Pattern: Ataxic;Scissoring;Step-through pattern;Narrow base of support;Right genu recurvatum;Left genu recurvatum;Right steppage;Left steppage Gait velocity: Decreased General Gait Details: Pt with multiple balance checks and 2 noted LOb requiring full assist to prevent fall. Pt would benefit from continued training to improve activation and coordination with gait stability. Stairs: No Wheelchair Mobility Wheelchair Mobility: No    Posture / Games developer Sitting - Balance Support: Feet supported;No upper extremity supported Static Sitting - Level of Assistance: 5: Stand by assistance Static Sitting - Comment/# of Minutes: pt requires min guard for supported sitting Dynamic Sitting Balance Dynamic Sitting - Balance Support: Right upper extremity supported Dynamic Sitting - Level of Assistance: 3: Mod assist Dynamic Sitting - Comments: to perform hygiene s/p BM Static Standing Balance Static Standing - Balance Support: Bilateral upper extremity supported Static Standing - Level of Assistance: 1: +2 Total assist Static Standing - Comment/# of Minutes: 1 min for hygiene Dynamic Standing Balance Dynamic Standing - Balance Support: Bilateral upper extremity supported;During functional activity Dynamic Standing - Level of Assistance:  (pt 50%) High Level Balance High Level Balance Activites: Side stepping;Direction changes;Turns High Level Balance Comments: pt with increased difficulty performing these tasks;  requires manual assist for control and stability    Special needs/care consideration Bowel mgmt: some constipation. On miralax TID and adding metamucil Bladder mgmt:some dribbling and urgency of urine    Previous Home Environment Living Arrangements:  (had lived with another son and his wife ( sons are twins)) Lives With: Alone Available Help at Discharge:  (son works, dtr in Social worker in school; son's mom in Social worker available) Type of Home: Apartment Home Layout: One level Home Access: Stairs to enter Entrance Stairs-Rails: Right Secretary/administrator of Steps: 10 Bathroom Shower/Tub: Engineer, manufacturing systems: Standard Bathroom Accessibility: Yes How Accessible: Accessible via walker Home Care Services: No Additional Comments: can stay with son who has 1 level, 1 STE, combo shower/tub with curtain  Discharge Living Setting Plans for Discharge Living Setting: Lives with (comment) (son and his wife due to accessibility) Type of Home at Discharge: House Discharge Home Layout: One level Discharge Home Access: Stairs to enter Entrance Stairs-Number of Steps: 3 steps Discharge Bathroom Shower/Tub: Tub/shower unit Discharge Bathroom Toilet: Standard Discharge Bathroom Accessibility: No Do you have any problems obtaining your medications?: Yes (Describe) (no insurance pta) Pt's son's mother- in-law lives in attached in [employer]. She cares for the grandchildren 1, 5, and 7. 2 kids are in school. Dtr in law is in nursing school. Son works.  Social/Family/Support Systems Patient Roles: Parent (has 4 sons) Contact Information: Beatriz Chancellor, son cell [phone] Anticipated Caregiver: son's mom in law in attatched in law suite prn when son and his wife work Anticipated Industrial/product designer Information: Beatriz Chancellor, son Ability/Limitations of Caregiver: son works, dtr in Social worker in nursing school, son's mother lin  law cares for the grandkids ( 1, 5, and 7) Caregiver Availability:  Intermittent Discharge Plan Discussed with Primary Caregiver: Yes Is Caregiver In Agreement with Plan?: Yes Does Caregiver/Family have Issues with Lodging/Transportation while Pt is in Rehab?: No    Goals/Additional Needs Patient/Family Goal for Rehab: Mod I w/c level PT when alone, min assist with PT short distances , min assist OT, n/a SLP Expected length of stay: [facility] 2 weeks Equipment Needs: has no equipment. has monies that she can purchase DME for d/c Special Service Needs: may need ramp Pt/Family Agrees to Admission and willing to participate: Yes Program Orientation Provided & Reviewed with Pt/Caregiver Including Roles  & Responsibilities: Yes   Decrease burden of Care through IP rehab admission: n/a   Possible need for SNF placement upon discharge:not anticipated   Patient Condition: This patient's condition remains as documented in the Consult dated 02/22/13, in which the Rehabilitation Physician determined and documented that the patient's condition is appropriate for intensive rehabilitative care in an inpatient rehabilitation facility. Medical workup with neurology has been completed as of 02/28/13. Please refer to above History and present illness for that workup. Foley has been removed and steroids completed. Patient has improved with therapy to an overall Mod assist with mobility and adls over past few days. I spoke with Dr. Riley Kill and pt is thought to still be appropriate for inpt rehab admission today.   Preadmission Screen Completed By:  Clois Dupes, 03/01/2013 11:10 AM ______________________________________________________________________   Discussed status with Dr. Riley Kill on 03/01/13 at  1110 and received telephone approval for admission today.  Admission Coordinator:  Clois Dupes, time 6962 Date 03/01/13.

## 2013-03-01 NOTE — Interval H&P Note (Signed)
Gail Gilbert was admitted today to Inpatient Rehabilitation with the diagnosis of transverse myelitis.  The patient's history has been reviewed, patient examined, and there is no change in status.  Patient continues to be appropriate for intensive inpatient rehabilitation.  I have reviewed the patient's chart and labs.  Questions were answered to the patient's satisfaction.  SWARTZ,ZACHARY T 03/01/2013, 10:27 PM

## 2013-03-01 NOTE — Progress Notes (Signed)
Physical Therapy Treatment Patient Details Name: Gail Gilbert MRN: [id number] DOB: 1951/01/30 Today's Date: 03/01/2013 Time: 4098-1191 PT Time Calculation (min): 40 min  PT Assessment / Plan / Recommendation Comments on Treatment Session  Pt continues to tolerated therapy well. Increased gait training and neurore-education techniques employed today as indicated. Continue to rec CIR fto improve overall functional moblity.    Follow Up Recommendations  CIR     Does the patient have the potential to tolerate intense rehabilitation   Yes     Equipment Recommendations  Other (comment) (TBD)    Recommendations for Other Services Rehab consult  Frequency Min 4X/week   Plan Discharge plan remains appropriate;Frequency remains appropriate    Precautions / Restrictions Precautions Precautions: Fall Precaution Comments: I have told pt she is not to get to recliner or BSC unassisted Restrictions Weight Bearing Restrictions: No   Pertinent Vitals/Pain No pain noted    Mobility  Bed Mobility Bed Mobility: Not assessed Transfers Transfers: Sit to Stand;Stand to Sit Sit to Stand: 3: Mod assist;From chair/3-in-1;With upper extremity assist;With armrests Stand to Sit: 3: Mod assist;With armrests;With upper extremity assist;To chair/3-in-1 Details for Transfer Assistance: Assist for stability  Ambulation/Gait Ambulation/Gait Assistance: 1: +2 Total assist Ambulation/Gait: Patient Percentage: 70% Ambulation Distance (Feet): 30 Feet (x3 with rest breaks (rw x1; body support x2)) Assistive device: Rolling walker;Body weight support system (2 person body weight support) Ambulation/Gait Assistance Details: Pt still reliant on visual feed back and compesentory strategies; multimodal cues during ambulation including stride length, stride width, weight shift and tactile hip cues.  Gait Pattern: Ataxic;Scissoring;Step-through pattern;Narrow base of support;Right genu recurvatum;Left genu  recurvatum;Right steppage;Left steppage Gait velocity: Decreased General Gait Details: Neuromuscluar reeduation performed with visual, verbal, and tactile cues to enhace gait strategies for mobility Stairs: No Wheelchair Mobility Wheelchair Mobility: No      PT Goals Acute Rehab PT Goals PT Goal Formulation: With patient Time For Goal Achievement: 03/07/13 Potential to Achieve Goals: Good Pt will go Supine/Side to Sit: with supervision;with HOB 0 degrees PT Goal: Supine/Side to Sit - Progress: Progressing toward goal Pt will go Sit to Stand: with supervision PT Goal: Sit to Stand - Progress: Progressing toward goal Pt will Transfer Bed to Chair/Chair to Bed: with modified independence PT Transfer Goal: Bed to Chair/Chair to Bed - Progress: Progressing toward goal Pt will Stand: with mod assist;1 - 2 min;with bilateral upper extremity support PT Goal: Stand - Progress: Progressing toward goal Pt will Ambulate: 16 - 50 feet;with supervision;with rolling walker PT Goal: Ambulate - Progress: Progressing toward goal Pt will Perform Home Exercise Program: Independently PT Goal: Perform Home Exercise Program - Progress: Progressing toward goal  Visit Information  Last PT Received On: 03/01/13 Assistance Needed: +2 PT/OT Co-Evaluation/Treatment: Yes Reason Eval/Treat Not Completed: Patient at procedure or test/unavailable    Subjective Data  Subjective: Had a rough night; was very restless Patient Stated Goal: return home and to normal functioning   Cognition  Cognition Overall Cognitive Status: Appears within functional limits for tasks assessed/performed Arousal/Alertness: Awake/alert Orientation Level: Appears intact for tasks assessed Behavior During Session: [facility] for tasks performed    Balance  Balance Balance Assessed: Yes Static Sitting Balance Static Sitting - Balance Support: Feet supported;No upper extremity supported Static Sitting - Level of Assistance: 5: Stand by  assistance Dynamic Sitting Balance Dynamic Sitting - Balance Support: Right upper extremity supported Dynamic Sitting - Level of Assistance: 3: Mod assist Static Standing Balance Static Standing - Balance Support: Bilateral upper extremity  supported Static Standing - Level of Assistance: 1: +2 Total assist Dynamic Standing Balance Dynamic Standing - Balance Support: Bilateral upper extremity supported;During functional activity Dynamic Standing - Level of Assistance:  (pt 50%) High Level Balance High Level Balance Activites: Side stepping;Direction changes;Turns High Level Balance Comments: pt with increased difficulty performing these tasks; requires manual assist for control and stability  End of Session PT - End of Session Equipment Utilized During Treatment: Gait belt Activity Tolerance: Patient tolerated treatment well Patient left: in chair;with call bell/phone within reach Nurse Communication: Mobility status   GP     Fabio Asa 03/01/2013, 11:23 AM Charlotte Crumb, PT DPT  [phone]

## 2013-03-01 NOTE — Progress Notes (Signed)
Patient ID: Gail Gilbert, female   DOB: 1951/11/08, 62 y.o.   MRN: [id number] Patient ID: Gail Gilbert, female   DOB: 1951-04-22, 62 y.o.   MRN: [id number] PGY-1 Daily Progress Note Family Medicine Teaching Service Service Pager: [phone]   Subjective: No changes in strength and coordination overnight. Feels left foot is more spastic than right. Difficulty sleeping after 4 hours of rest with feeling of restlessness and anxiety. BM yesterday. No voiding issues. No abdominal complaints. Awaiting meeting with case management today.   Objective:  Filed Vitals:   03/01/13 0540  BP: 102/53  Pulse: 72  Temp: 98.4 F (36.9 C)  Resp: 18   Physical Exam: Gen:  Well appearing, NAD. CV: Regular rate and rhythm, no murmurs rubs or gallops PULM: clear to auscultation bilaterally. No wheezes/rales/rhonchi ABD: soft/nontender/nondistended EXT: No edema Neuro: Alert and oriented x3. LE strength 5/5 bilateral foot dorsi/plantar flexion, 5/5 bilateral hip extension/flexion. Bilateral ankle clonus, upgoing babinskis.  UE right 5/5 grip strength, 5/5 left.  Sensation diminished but present bilateral lower extremities and RUE ulnar distribution.   Labs and imaging:  CBC No results found for this basename: WBC, HGB, HCT, PLT,  in the last 168 hours BMET/CMET  Recent Labs Lab 02/25/13 1733  CREATININE 0.61   CSF studies - WBC 1, No Segs, Few lymphs, Rare Macrophages, No eosiniphils, Clear and colorless CSF culture - Negative x 3 days (Final)  Mr Laqueta Jean Wo Contrast 02/20/2013  IMPRESSION: Scattered small subcortical white matter hyperintensities in the cerebral white matter on the right.  These are most likely related to chronic microvascular ischemia.  Differential includes migraine headache and vasculitis.  No acute infarct or mass.  Multiple sclerosis not felt likely.   Mr Thoracic Spine Wo Contrast 02/20/2013  IMPRESSION: The conus medullaris is normal.  Lumbar degenerative changes as above.   These appear chronic.  I discussed the findings by telephone with Dr. Roseanne Reno.   Original Report Authenticated By: Janeece Riggers, M.D.    Mr Cervical Spine W Wo Contrast 02/24/2013  IMPRESSION: New evidence of myelopathy in the posterior columns and lateral columns at C3-C5 as described.     Mr Lumbar Spine W Wo Contrast 02/25/2013  MPRESSION: Today's study was done with contrast as opposed to the previous exam.  The overall morphologic findings are unchanged.  There is lateral recess stenosis at L3-4 right worse than left because of bulging of the disc in combination with facet arthropathy.  Fluid filled facets could allow slippage of this level with flexion or extension which could cause worsening of the lateral recess stenosis.  L4-5 shows advanced facet arthropathy with anterolisthesis of 1 cm and broad-based pseudo disc herniation. The central canal has been decompressed surgically.  There is stenosis of the lateral recesses right worse than left and of the foraminal left worse than right.  Because contrast was administered on this exam, one can appreciate enhancement and edema in the region of the facet on the right at L3- 4, the facet on the left at L3-4 to a lesser extent, and the facets at L4-5 left worse than right.  There is also some endplate enhancement on the left at L4-5.  There is some enhancement in the dorsal epidural space in the L3-L4 region.  I think it is most likely that this finding relates to degenerative disease and epidural inflammation associated with that.  However, by imaging, one could not rule out the possibility of infectious inflammation. If there is clinical evidence  of infectious inflammation, this study may lend support to that.   Assessment/Plan: 62 yo F presents with acute onset of bilateral LE weakness and tingling and RUE weakness and tingling with urinary retention.      1. Bilateral LE and RUE weakness: acute onset. Weakness without atrophy and with hyperreflexia on  exam concerning for UMN lesion. Unclear etiology at this time, possibly transverse myelitis or cervical spondylotic myelopathy due to acute injury of chronically degenerated spine.   -- MRI Cervical spine w/ contrast 3/13 shows cervical myelopathy.  MRI w/ contrast of Lumbar spine shows possible inflammatory changes but no structural differences from previous MRI w/out contrast --Neurology following, greatly appreciate recommendations: started solumedrol 500mg  IV 3/9; Steroids now discontinued after speaking with Dr. Thad Ranger 3/13. --Baclofen 5mg  PO QID for spasticity 3/11 --LP--initial results show no inflammation or signs of infection. -- Work up: TSH (1.001), B12 (765 norm), ANA (negative), lyme titer (negative), anti-GAD antibody (pending), Ceruloplasmin (normal), GAD (pending); STI panel negative: HIV, RPR, G/C.  -- Awaiting CIR placement vs. SNF vs. Home for financial considerations. Case management coming AM 3/18 - Will continue to follow neurology recommendations.    2. Depression --continue home sertraline 50mg  PO once daily  3. Osteoarthritis --continue home mobic 15 mg PO once daily --vit D 1000 units PO once daily  FEN/GI: Regular diet.  --Successful voiding trial, d/c foley for now.  --miralax BID 3/17; add metamucil  DVT PPX: Lovenox ppx dose.  Code: Full.  Dispo: Inpatient rehab pending; Need social work to coordinate medicaid application  Ovidio Hanger, MS4 Family Medicine  PGY Addendum: 62 yo F with lower extremity weakness with extensive work up. Pt states she is feeling about the same this morning. She states she feels "jittery" and thinks it is related to her Ambien. She slept for only a few hours last night. States she is ready to make plans for discharge since she feels she needs more intensive therapy.  O: Gen: Sitting up in chair, awake and alert. HEENT: AT, [place]. MMM Abd: Soft, nontender Neuro: Not formally tested. Good grip with handshake and able to feed  herself.  Skin: warm and dry  A/P: - Awaiting placement and for pt to make a decision about CIR vs. SNF - Continue current regimen with Baclofen, Zoloft and mobic - D/c Ambien. Pt asked to let us know if she needs something to help her rest - Appreciate neurology input, as well as CSW - Continue to monitor.  Kampbell Holaway M. Torria Fromer, M.D. 03/01/2013 9:44 AM

## 2013-03-01 NOTE — Progress Notes (Signed)
Patient transferred to Inpatient Rehab-4000.  Report called to RN on 4000.  Patient aware of transfer.  Vital signs stable. No complaints of pain.

## 2013-03-02 ENCOUNTER — Inpatient Hospital Stay (HOSPITAL_COMMUNITY): Payer: Medicaid Other | Admitting: Occupational Therapy

## 2013-03-02 ENCOUNTER — Inpatient Hospital Stay (HOSPITAL_COMMUNITY): Payer: Medicaid Other

## 2013-03-02 DIAGNOSIS — N390 Urinary tract infection, site not specified: Secondary | ICD-10-CM

## 2013-03-02 DIAGNOSIS — F341 Dysthymic disorder: Secondary | ICD-10-CM

## 2013-03-02 DIAGNOSIS — G373 Acute transverse myelitis in demyelinating disease of central nervous system: Secondary | ICD-10-CM

## 2013-03-02 LAB — URINALYSIS, ROUTINE W REFLEX MICROSCOPIC
Glucose, UA: NEGATIVE mg/dL
Ketones, ur: NEGATIVE mg/dL
pH: 6.5 (ref 5.0–8.0)

## 2013-03-02 LAB — CBC WITH DIFFERENTIAL/PLATELET
Basophils Absolute: 0 10*3/uL (ref 0.0–0.1)
Basophils Relative: 0 % (ref 0–1)
Eosinophils Relative: 1 % (ref 0–5)
HCT: 36.9 % (ref 36.0–46.0)
MCHC: 34.4 g/dL (ref 30.0–36.0)
MCV: 90.4 fL (ref 78.0–100.0)
Monocytes Absolute: 1.1 10*3/uL — ABNORMAL HIGH (ref 0.1–1.0)
RDW: 12.6 % (ref 11.5–15.5)

## 2013-03-02 LAB — URINE MICROSCOPIC-ADD ON

## 2013-03-02 LAB — COMPREHENSIVE METABOLIC PANEL
AST: 15 U/L (ref 0–37)
Albumin: 3.2 g/dL — ABNORMAL LOW (ref 3.5–5.2)
Calcium: 8.9 mg/dL (ref 8.4–10.5)
Creatinine, Ser: 0.61 mg/dL (ref 0.50–1.10)
GFR calc non Af Amer: >90 mL/min (ref >90)

## 2013-03-02 MED ORDER — TAB-A-VITE/IRON PO TABS
1.0000 | ORAL_TABLET | Freq: Every day | ORAL | Status: DC
  Administered 2013-03-02 – 2013-03-10 (×9): 1 via ORAL
  Filled 2013-03-02 (×11): qty 1

## 2013-03-02 MED ORDER — CIPROFLOXACIN HCL 250 MG PO TABS
250.0000 mg | ORAL_TABLET | Freq: Two times a day (BID) | ORAL | Status: DC
  Administered 2013-03-02 – 2013-03-09 (×15): 250 mg via ORAL
  Filled 2013-03-02 (×17): qty 1

## 2013-03-02 MED ORDER — TRAZODONE HCL 50 MG PO TABS
50.0000 mg | ORAL_TABLET | Freq: Every day | ORAL | Status: DC
  Administered 2013-03-02 – 2013-03-09 (×8): 50 mg via ORAL
  Filled 2013-03-02 (×8): qty 1

## 2013-03-02 NOTE — Evaluation (Signed)
Physical Therapy Assessment and Plan  Patient Details  Name: Gail Gilbert MRN: [id number] Date of Birth: October 09, 1951  PT Diagnosis: Abnormality of gait, Ataxia, Ataxic gait, Difficulty walking, Impaired sensation, Low back pain and Muscle weakness Rehab Potential: Excellent ELOS: 10 days   Today's Date: 03/02/2013 Time: 1030-1128 Time Calculation (min): 58 min  Problem List:  Patient Active Problem List  Diagnosis  . Spondylolisthesis  . RLS (restless legs syndrome)  . Anxiety  . Leg weakness, bilateral  . Myelitis  . Urinary retention  . Idiopathic transverse myelitis    Past Medical History:  Past Medical History  Diagnosis Date  . Arthritis     cervical,feet  . Depression   . Migraine   . GERD (gastroesophageal reflux disease) 12/2006    esophageal stricture  . Vitamin D deficiency   . MVP (mitral valve prolapse)   . Spondylolisthesis     L4-5  . RLS (restless legs syndrome)   . Anxiety   . Interstitial cystitis   . IBS (irritable bowel syndrome)   . Insomnia   . DVT (deep vein thrombosis) in pregnancy 1989    postpartum   Past Surgical History:  Past Surgical History  Procedure Laterality Date  . Colonoscopy    . Bunionectomy  09,05    both feet  . Upper gastrointestinal endoscopy    . Ear examination under anesthesia      to correct ringing in ear at [facility]  . Lumbar aspiration and drainage      cyst  . Hammer toe surgery  10/29/2011    Procedure: HAMMER TOE CORRECTION;  Surgeon: Nestor Lewandowsky;  Location: [facility];  Service: Orthopedics;  Laterality: Left;  Left 2nd toe duvries arthroplasty  . Decompressive lumbar laminectomy level 4  1999    Assessment & Plan Clinical Impression: Patient is a 62 y.o. year old right-handed female with history of low back pain status post lumbar L4 decompression laminectomy 1999, restless leg syndrome.. Admitted 02/21/2011 with tingling in her feet and lower extremity weakness with staggering gait  as well his right upper extremity numbness and weakness. She denied any visual changes or slurred speech. Patient did note some increased urinary retention. MRI of the brain with no acute infarct or mass. There was some chronic microvascular ischemia. MRI cervical thoracic lumbar spine with no cord compression. Neurology services consulted with suspect transverse myelitis as patient was noted to have marked proximal weakness BLE with sustained clonus at ankles as well as hyperreflexia BUE with mild distal weakness R>L hand. Solu-Medrol. She was treated with IV solumedrol and baclofen was added for spasticity. LP done-CSF- glucose-100, few WBC, culture negative.   HIV, GC probe and RPR-negative, Vit B 12 levels-765, Vit D1-76, ANA-negative, GAD<1.0, And TSH-1.001. Neurology recommended repeat MRI for follow up. MRI neck 04/13 showed abnormal signal with evidence of myelopathy in posterior and lateral columns at C3-C5 and MRI Lumbar spine with lateral stenosis L3-4, L4-5 advanced facet arthropathy with anterolisthesis 1 cm, some enhancement in L3-L4 dorsal space likely due to degenerative disease and associated epidural inflammation. Neurology feels that transverse myelitis remains in differential of etiology. Patient transferred to CIR on 03/01/2013 .   Patient currently requires min to mod A with mobility secondary to muscle weakness and muscle joint tightness, decreased cardiorespiratoy endurance, impaired timing and sequencing, unbalanced muscle activation, ataxia and decreased coordination and decreased standing balance, decreased postural control and decreased balance strategies.  Prior to hospitalization, patient was independent  with  mobility and lived Alone (plans to d/c to son and daughter in law's house) in an apartment, but will be discharging to son's one story house.  Home access is 1-2 Stairs to enter.  Patient will benefit from skilled PT intervention to maximize safe functional mobility, minimize  fall risk and decrease caregiver burden for planned discharge home with 24 hour supervision.  Anticipate patient will benefit from follow up OP at discharge.  PT - End of Session Endurance Deficit: Yes PT Assessment Rehab Potential: Excellent Barriers to Discharge: None PT Plan PT Intensity: Minimum of 1-2 x/day ,45 to 90 minutes PT Frequency: 5 out of 7 days PT Duration Estimated Length of Stay: 10 days PT Treatment/Interventions: Ambulation/gait training;Balance/vestibular training;Community reintegration;Discharge planning;Disease management/prevention;DME/adaptive equipment instruction;Functional electrical stimulation;Functional mobility training;Neuromuscular re-education;Pain management;Patient/family education;Psychosocial support;Skin care/wound management;Splinting/orthotics;Stair training;Therapeutic Activities;Therapeutic Exercise;UE/LE Strength taining/ROM;UE/LE Coordination activities;Wheelchair propulsion/positioning PT Recommendation Follow Up Recommendations: Outpatient PT Patient destination: Home (son and daughter in law's house) Equipment Recommended: Wheelchair (measurements);Wheelchair cushion (measurements);Rolling walker with 5" wheels  Skilled Therapeutic Intervention Individual treatment initiated with focus on transfer training (squat pivot, stand pivot with and without RW), stair training, neuro re-ed for balance and LE coordination, and gait with RW (see above for description). W/c propulsion on unit and in pt room with overall S. Assisted pt with toilet transfers prior to leaving the room with overall min A for balance.  PT Evaluation Precautions/Restrictions Precautions Precautions: Fall Restrictions Weight Bearing Restrictions: No Pain Pain Assessment Pain Assessment: 0-10 Pain Score:   5 Pain Type: Acute pain Pain Location: Head Pain Descriptors: Aching Pain Frequency: Occasional Patients Stated Pain Goal: 2 Pain Intervention(s): Medication (See  eMAR) Home Living/Prior Functioning Home Living Lives With: Alone (plans to d/c to son and daughter in law's house) Available Help at Discharge: Family Type of Home: House Home Access: Stairs to enter Secretary/administrator of Steps: 1 Entrance Stairs-Rails: None Home Layout: One level Bathroom Shower/Tub: Forensic scientist: Standard Bathroom Accessibility: Yes How Accessible: Accessible via walker Home Adaptive Equipment: None Additional Comments: Patient states she will be living with son and daughter-in-law at discharge Prior Function Level of Independence: Independent with basic ADLs;Independent with gait;Independent with transfers Able to Take Stairs?: Yes Driving: Yes 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; has been looking for a job) Leisure: Hobbies-yes (Comment) Comments: hiking, walking outdoors Vision/Perception  Vision - History Baseline Vision: Wears glasses all the time Patient Visual Report: No change from baseline Vision - Assessment Eye Alignment: Within Functional Limits Perception Perception: Within Functional Limits Praxis Praxis: Intact  Cognition Overall Cognitive Status: Appears within functional limits for tasks assessed Arousal/Alertness: Awake/alert Orientation Level: Oriented X4 Memory: Appears intact Awareness: Appears intact Problem Solving: Appears intact Safety/Judgment: Appears intact Sensation Sensation Light Touch: Impaired Detail Light Touch Impaired Details: Impaired RLE;Impaired LLE (LLE > RLE) Stereognosis: Impaired by gross assessment Hot/Cold: Impaired by gross assessment Proprioception: Impaired Detail Proprioception Impaired Details: Impaired RLE;Impaired LLE Additional Comments: patient with complaints of some numbness and tingling distally R>L Coordination Gross Motor Movements are Fluid and Coordinated: No Fine Motor Movements are Fluid and Coordinated: Yes Motor  Motor Motor: Ataxia;Motor  impersistence    Locomotion  Ambulation Ambulation/Gait Assistance: 4: Min assist;3: Mod assist Gait Gait Pattern: Step-through pattern;Scissoring;Ataxic;Decreased trunk rotation;Narrow base of support  Trunk/Postural Assessment  Cervical Assessment Cervical Assessment: Within Functional Limits Thoracic Assessment Thoracic Assessment: Within Functional Limits Lumbar Assessment Lumbar Assessment: Within Functional Limits Postural Control Postural Control: Deficits on evaluation Protective Responses: decreased balance reactions in  standing  Balance Balance Balance Assessed: Yes Static Sitting Balance Static Sitting - Level of Assistance: 6: Modified independent (Device/Increase time) Dynamic Sitting Balance Dynamic Sitting - Level of Assistance: 6: Modified independent (Device/Increase time) Static Standing Balance Static Standing - Level of Assistance: 4: Min assist Dynamic Standing Balance Dynamic Standing - Level of Assistance: 4: Min assist;3: Mod assist (min A with UE support; up to mod A without) Extremity Assessment  RUE Assessment RUE Assessment: Exceptions to [facility] RUE AROM (degrees) RUE Overall AROM Comments: AROM is WFL RUE PROM (degrees) RUE Overall PROM Comments: PROM is [facility] RUE Strength RUE Overall Strength Comments: Strength grossly 4+/5, patient reports weakness in UEs LUE Assessment LUE Assessment: Exceptions to WFL LUE AROM (degrees) LUE Overall AROM Comments: AROM is WFL LUE PROM (degrees) LUE Overall PROM Comments: PROM is WFL LUE Strength LUE Overall Strength Comments: Strength grossly 5/5, patient reports weakness in UEs RLE Assessment RLE Assessment: Exceptions to [facility] RLE Strength RLE Overall Strength Comments: weaker proximally; knee ext and hip flex 3+/5; knee flexion and ankle 4/5 LLE Assessment LLE Assessment: Exceptions to [facility] LLE Strength LLE Overall Strength Comments: weaker proximally; knee ext and hip flex 3+/5; knee flexion and ankle  4/5  FIM:  FIM - Bed/Chair Transfer Bed/Chair Transfer: 5: Supine > Sit: Supervision (verbal cues/safety issues);5: Sit > Supine: Supervision (verbal cues/safety issues);4: Bed > Chair or W/C: Min A (steadying Pt. > 75%);4: Chair or W/C > Bed: Min A (steadying Pt. > 75%) FIM - Locomotion: Wheelchair Locomotion: Wheelchair: 5: Travels 150 ft or more: maneuvers on rugs and over door sills with supervision, cueing or coaxing FIM - Locomotion: Ambulation Locomotion: Ambulation Assistive Devices: Designer, industrial/product Ambulation/Gait Assistance: 4: Min assist;3: Mod assist Locomotion: Ambulation: 1: Travels less than 50 ft with moderate assistance (Pt: 50 - 74%) FIM - Locomotion: Stairs Locomotion: Building control surveyor: Hand rail - 2 Locomotion: Stairs: 2: Up and Down 4 - 11 stairs with minimal assistance (Pt.>75%)   Refer to Care Plan for Long Term Goals  Recommendations for other services: None  Discharge Criteria: Patient will be discharged from PT if patient refuses treatment 3 consecutive times without medical reason, if treatment goals not met, if there is a change in medical status, if patient makes no progress towards goals or if patient is discharged from hospital.  The above assessment, treatment plan, treatment alternatives and goals were discussed and mutually agreed upon: by patient  Tedd Sias 03/02/2013, 11:53 AM

## 2013-03-02 NOTE — Progress Notes (Signed)
Subjective/Complaints: Restless night. Didn't sleep well. Denies pain. Had urinary urgency,dysuria last night. A 12 point review of systems has been performed and if not noted above is otherwise negative.   Objective: Vital Signs: Blood pressure 113/73, pulse 85, temperature 98.8 F (37.1 C), temperature source Oral, resp. rate 20, weight 62.551 kg (137 lb 14.4 oz), SpO2 98.00%. No results found.  Recent Labs  03/02/13 0550  WBC 14.3*  HGB 12.7  HCT 36.9  PLT 309    Recent Labs  03/02/13 0550  NA 135  K 4.0  CL 99  GLUCOSE 89  BUN 18  CREATININE 0.61  CALCIUM 8.9   CBG (last 3)  No results found for this basename: GLUCAP,  in the last 72 hours  Wt Readings from Last 3 Encounters:  03/02/13 62.551 kg (137 lb 14.4 oz)  02/23/13 61.689 kg (136 lb)  08/05/12 61.689 kg (136 lb)    Physical Exam:  .Constitutional: She is oriented to person, place, and time. She appears well-developed and well-nourished.  HENT:  Head: Normocephalic and atraumatic.  Eyes: Pupils are equal, round, and reactive to light.  Neck: Normal range of motion. Neck supple.  Cardiovascular: Normal rate and regular rhythm.  Pulmonary/Chest: Effort normal and breath sounds normal.  Abdominal: Soft. Bowel sounds are normal. She exhibits no distension. There is no tenderness.  Musculoskeletal: She exhibits no edema and no tenderness.  Neurological: She is alert and oriented to person, place, and time.  BLE with ataxia and hyper reflexia. Decreased sensation RLE>LLE. Impaired proprioception. Impaired sensation BLE below trunk. Mild sensory loss in hands to forearms. UE strength grossly 5/5 except for HI which were a little weak (4+/5). LE 4/5 proximal to distal.  Skin: Skin is warm and dry.  Psychiatric: She has a normal mood and affect. Her behavior is slightly anxious.  Judgment and thought content normal.    Assessment/Plan: 1. Functional deficits secondary to cervico-thoracic transverse myelitis  which require 3+ hours per day of interdisciplinary therapy in a comprehensive inpatient rehab setting. Physiatrist is providing close team supervision and 24 hour management of active medical problems listed below. Physiatrist and rehab team continue to assess barriers to discharge/monitor patient progress toward functional and medical goals. FIM:                   Comprehension Comprehension Mode: Auditory Comprehension: 7-Follows complex conversation/direction: With no assist  Expression Expression Mode: Verbal Expression: 7-Expresses complex ideas: With no assist  Social Interaction Social Interaction: 7-Interacts appropriately with others - No medications needed.     Memory Memory: 7-Complete Independence: No helper  Medical Problem List and Plan:  1. DVT Prophylaxis/Anticoagulation: Pharmaceutical: Lovenox  2. Pain Management: continue mobic for acute on chronic back pain.  3. Mood: Continue zoloft for history of depression. Will schedule trazodone HS. Pt with a lot of anxiety currently given her medical condition. 4. Neuropsych: This patient is capable of making decisions on his/her own behalf.  5. GU: Reports urinary urgency/burning last night. UA is +. Begin empiric cipro and await culture.  6. Spasticity: continue baclofen.  7. FEN: begin MVI with Fe per pt request.  LOS (Days) 1 A FACE TO FACE EVALUATION WAS PERFORMED  Huie Ghuman T 03/02/2013 8:54 AM

## 2013-03-02 NOTE — Evaluation (Signed)
Occupational Therapy Assessment and Plan & Session Notes  Patient Details  Name: BLAYKE PINERA MRN: [id number] Date of Birth: 07/22/1951  OT Diagnosis: abnormal posture, ataxia and muscle weakness (generalized) Rehab Potential: Rehab Potential: Good ELOS: 7-10 days   Today's Date: 03/02/2013  ASSESSMENT AND PLAN  Problem List:  Patient Active Problem List  Diagnosis  . Spondylolisthesis  . RLS (restless legs syndrome)  . Anxiety  . Leg weakness, bilateral  . Myelitis  . Urinary retention  . Idiopathic transverse myelitis    Past Medical History:  Past Medical History  Diagnosis Date  . Arthritis     cervical,feet  . Depression   . Migraine   . GERD (gastroesophageal reflux disease) 12/2006    esophageal stricture  . Vitamin D deficiency   . MVP (mitral valve prolapse)   . Spondylolisthesis     L4-5  . RLS (restless legs syndrome)   . Anxiety   . Interstitial cystitis   . IBS (irritable bowel syndrome)   . Insomnia   . DVT (deep vein thrombosis) in pregnancy 1989    postpartum   Past Surgical History:  Past Surgical History  Procedure Laterality Date  . Colonoscopy    . Bunionectomy  09,05    both feet  . Upper gastrointestinal endoscopy    . Ear examination under anesthesia      to correct ringing in ear at [facility]  . Lumbar aspiration and drainage      cyst  . Hammer toe surgery  10/29/2011    Procedure: HAMMER TOE CORRECTION;  Surgeon: Nestor Lewandowsky;  Location: [facility];  Service: Orthopedics;  Laterality: Left;  Left 2nd toe duvries arthroplasty  . Decompressive lumbar laminectomy level 4  1999    Clinical Impression: RELDA AGOSTO is a 62 y.o. right-handed female with history of low back pain status post lumbar L4 decompression laminectomy 1999, restless leg syndrome.. Admitted 02/21/2011 with tingling in her feet and lower extremity weakness with staggering gait as well his right upper extremity numbness and weakness. She denied any  visual changes or slurred speech. Patient did note some increased urinary retention. MRI of the brain with no acute infarct or mass. There was some chronic microvascular ischemia. MRI cervical thoracic lumbar spine with no cord compression. Neurology services consulted with suspect transverse myelitis as patient was noted to have marked proximal weakness BLE with sustained clonus at ankles as well as hyperreflexia BUE with mild distal weakness R>L hand. Solu-Medrol. She was treated with IV solumedrol and baclofen was added for spasticity. LP done-CSF- glucose-100, few WBC, culture negative.  HIV, GC probe and RPR-negative, Vit B 12 levels-765, Vit D1-76, ANA-negative, GAD<1.0, And TSH-1.001. Neurology recommended repeat MRI for follow up. MRI neck 04/13 showed abnormal signal with evidence of myelopathy in posterior and lateral columns at C3-C5 and MRI Lumbar spine with lateral stenosis L3-4, L4-5 advanced facet arthropathy with anterolisthesis 1 cm, some enhancement in L3-L4 dorsal space likely due to degenerative disease and associated epidural inflammation. Neurology feels that transverse myelitis remains in differential of etiology.  Patient has has some improvement in LE strength as well as mobility. Foley discontinued and she was noted to have problems with voiding as well as constipation. therapies ongoing and patient continues with decreased balance, impaired coordination as well as BLE instability. therapy team recommended CIR for progression. Patient transferred to CIR on 03/01/2013 .    Patient currently requires min with basic self-care skills and IADL secondary to  muscle weakness, muscle joint tightness and muscle paralysis and decreased sitting balance, decreased standing balance, decreased postural control and decreased balance strategies. Patient also presents with ataxia in BUEs and BLEs. Prior to hospitalization, patient could complete ADLs & IADLs independently.   Patient will benefit from  skilled intervention to increase independence with basic self-care skills prior to discharge home with son and daughter-in-law.  Anticipate patient will require intermittent supervision and no further OT follow recommended.  OT - End of Session Activity Tolerance: Tolerates 30+ min activity with multiple rests Endurance Deficit: Yes OT Assessment Rehab Potential: Good Barriers to Discharge: None (none known at this time) OT Plan OT Intensity: Minimum of 1-2 x/day, 45 to 90 minutes OT Frequency: 5 out of 7 days OT Duration/Estimated Length of Stay: 7-10 days OT Treatment/Interventions: Balance/vestibular training;Community reintegration;Discharge planning;Disease mangement/prevention;DME/adaptive equipment instruction;Functional mobility training;Neuromuscular re-education;Pain management;Patient/family education;Psychosocial support;Self Care/advanced ADL retraining;Skin care/wound managment;Splinting/orthotics;Therapeutic Activities;UE/LE Strength taining/ROM;Therapeutic Exercise;UE/LE Coordination activities;Wheelchair propulsion/positioning OT Recommendation Recommendations for Other Services:  (none at this time) Patient destination: Home (son and daughter in law's house) Follow Up Recommendations: None Equipment Recommended: 3 in 1 bedside comode;Tub/shower bench  Precautions/Restrictions  Precautions Precautions: Fall Restrictions Weight Bearing Restrictions: No  General Chart Reviewed: Yes Family/Caregiver Present: No  Pain Pain Assessment Pain Assessment: 0-10 Pain Score:   5 Pain Type: Acute pain Pain Location: Head Pain Descriptors: Aching Pain Frequency: Occasional Patients Stated Pain Goal: 2 Pain Intervention(s): Medication (See eMAR)  Home Living/Prior Functioning Home Living Lives With: Alone (plans to d/c to son and daughter in law's house) Available Help at Discharge: Family Type of Home: House Home Access: Stairs to enter Secretary/administrator of  Steps: 1 Entrance Stairs-Rails: None Home Layout: One level Bathroom Shower/Tub: Forensic scientist: Standard Bathroom Accessibility: Yes How Accessible: Accessible via walker Home Adaptive Equipment: None Additional Comments: Patient states she will be living with son and daughter-in-law at discharge IADL History Homemaking Responsibilities: Yes Meal Prep Responsibility: Primary Laundry Responsibility: Primary Cleaning Responsibility: Primary Bill Paying/Finance Responsibility: Primary Shopping Responsibility: Primary Child Care Responsibility: No Current License: Yes Occupation: Unemployed 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, 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 Leisure and Hobbies: enjoys outdoor activities like biking, gardening, walking, etc Prior Function Level of Independence: Independent with basic ADLs;Independent with gait;Independent with transfers Able to Take Stairs?: Yes Driving: Yes 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; has been looking for a job) Leisure: Hobbies-yes (Comment) Comments: hiking, walking outdoors  ADL - See FIM  Vision/Perception  Vision - History Baseline Vision: Wears glasses all the time Patient Visual Report: No change from baseline Vision - Assessment Eye Alignment: Within Functional Limits Perception Perception: Within Functional Limits Praxis Praxis: Intact   Cognition Overall Cognitive Status: Appears within functional limits for tasks assessed Arousal/Alertness: Awake/alert Orientation Level: Oriented X4 Memory: Appears intact Awareness: Appears intact Problem Solving: Appears intact Safety/Judgment: Appears intact  Sensation Sensation Light Touch: Impaired Detail Light Touch Impaired Details: Impaired RLE;Impaired LLE (LLE > RLE) Stereognosis: Impaired by gross assessment Hot/Cold: Impaired by gross assessment Proprioception: Impaired Detail Proprioception Impaired Details: Impaired RLE;Impaired  LLE Additional Comments: patient with complaints of some numbness and tingling distally R>L Coordination Gross Motor Movements are Fluid and Coordinated: No Fine Motor Movements are Fluid and Coordinated: Yes  Motor  Motor Motor: Ataxia;Motor impersistence  Trunk/Postural Assessment  Cervical Assessment Cervical Assessment: Within Functional Limits Thoracic Assessment Thoracic Assessment: Within Functional Limits Lumbar Assessment Lumbar Assessment: Within Functional Limits Postural Control Postural Control: Deficits on  evaluation Protective Responses: decreased balance reactions in standing   Balance Balance Balance Assessed: Yes Static Sitting Balance Static Sitting - Level of Assistance: 6: Modified independent (Device/Increase time) Dynamic Sitting Balance Dynamic Sitting - Level of Assistance: 6: Modified independent (Device/Increase time) Static Standing Balance Static Standing - Level of Assistance: 4: Min assist Dynamic Standing Balance Dynamic Standing - Level of Assistance: 4: Min assist;3: Mod assist (min A with UE support; up to mod A without)  Extremity/Trunk Assessment RUE Assessment RUE Assessment: Exceptions to [facility] RUE AROM (degrees) RUE Overall AROM Comments: AROM is WFL RUE PROM (degrees) RUE Overall PROM Comments: PROM is [facility] RUE Strength RUE Overall Strength Comments: Strength grossly 4+/5, patient reports weakness in UEs LUE Assessment LUE Assessment: Exceptions to WFL LUE AROM (degrees) LUE Overall AROM Comments: AROM is WFL LUE PROM (degrees) LUE Overall PROM Comments: PROM is WFL LUE Strength LUE Overall Strength Comments: Strength grossly 5/5, patient reports weakness in UEs  FIM:  FIM - Eating Eating Activity: 7: Complete independence:no helper FIM - Grooming Grooming Steps: Wash, rinse, dry face;Wash, rinse, dry hands;Oral care, brush teeth, clean dentures;Brush, comb hair;Shave or apply make-up Grooming: 5: Set-up assist to obtain  items FIM - Bathing Bathing Steps Patient Completed: Chest;Right Arm;Left Arm;Abdomen;Front perineal area;Buttocks;Right upper leg;Left upper leg;Right lower leg (including foot);Left lower leg (including foot) Bathing: 4: Steadying assist FIM - Upper Body Dressing/Undressing Upper body dressing/undressing steps patient completed: Thread/unthread right sleeve of pullover shirt/dresss;Put head through opening of pull over shirt/dress;Thread/unthread left sleeve of pullover shirt/dress;Pull shirt over trunk Upper body dressing/undressing: 5: Set-up assist to: Obtain clothing/put away FIM - Lower Body Dressing/Undressing Lower body dressing/undressing steps patient completed: Thread/unthread right underwear leg;Thread/unthread left underwear leg;Pull underwear up/down;Thread/unthread right pants leg;Thread/unthread left pants leg;Pull pants up/down;Don/Doff right sock;Don/Doff left sock;Don/Doff right shoe;Don/Doff left shoe;Fasten/unfasten right shoe;Fasten/unfasten left shoe Lower body dressing/undressing: 4: Steadying Assist FIM - Toileting Toileting steps completed by patient: Adjust clothing prior to toileting;Performs perineal hygiene;Adjust clothing after toileting Toileting: 4: Steadying assist FIM - Bed/Chair Transfer Bed/Chair Transfer: 5: Supine > Sit: Supervision (verbal cues/safety issues);5: Sit > Supine: Supervision (verbal cues/safety issues);4: Bed > Chair or W/C: Min A (steadying Pt. > 75%);4: Chair or W/C > Bed: Min A (steadying Pt. > 75%) FIM - Archivist Transfers Assistive Devices: Elevated toilet seat;Grab bars;Walker Toilet Transfers: 4-To toilet/BSC: Min A (steadying Pt. > 75%);4-From toilet/BSC: Min A (steadying Pt. > 75%) FIM - Secretary/administrator Devices: Shower chair;Walker;Grab bars Tub/shower Transfers: 4-Into Tub/Shower: Min A (steadying Pt. > 75%/lift 1 leg);4-Out of Tub/Shower: Min A (steadying Pt. > 75%/lift 1 leg)   Refer to  Care Plan for Long Term Goals  Recommendations for other services: None at this time.  Discharge Criteria: Patient will be discharged from OT if patient refuses treatment 3 consecutive times without medical reason, if treatment goals not met, if there is a change in medical status, if patient makes no progress towards goals or if patient is discharged from hospital.  The above assessment, treatment plan, treatment alternatives and goals were discussed and mutually agreed upon: by patient  ---------------------------------------------------------------------------------------------------------  SESSION NOTES  Session #1 [phone] - 60 Minutes Individual Therapy No complaints of pain Initial 1:1 occupational therapy evaluation completed. Focused skilled intervention on bed mobility, sit<>stands, functional mobility/ambulation with use of rolling walker, toilet transfer on/off elevated toilet seat, toileting (peri care and clothing management), shower stall transfer on/off shower chair, UB/LB bathing & dressing, and grooming tasks seated at sink in w/c.  Patient left seated at sink in w/c at end of session to finish with grooming tasks.   Session #2 [id number] - 45 Minutes Individual Therapy No complaints of pain Patient found seated in recliner. Patient ambulated from recliner -> bathroom for toilet transfer and toileting. Patient then stood at sink for grooming task of washing hands. Patient then ambulated -> w/c seated outside of room. From there patient propelled self -> ADL apartment for focus on furniture transfer and tub/shower transfer on/off tub transfer bench. From ADL apartment patient ambulated -> therapy gym for therapeutic exercise focusing on sit<>stands with and without UE support, dynamic standing balance/tolerance/endurance, functional use of bilateral UEs, and overall activity tolerance/endurance. Patient propelled self back to room and transferred onto recliner with steady assist  and verbal cues for correct w/c placement for safe and effective transfer. Patient with complaints of stomach pain/cramping during session, notified RN.   Maximino Cozzolino 03/02/2013, 11:52 AM

## 2013-03-02 NOTE — Progress Notes (Signed)
Patient information reviewed and entered into eRehab system by Haydyn Liddell, RN, CRRN, PPS Coordinator.  Information including medical coding and functional independence measure will be reviewed and updated through discharge.    

## 2013-03-02 NOTE — Progress Notes (Signed)
Physical Therapy Session Note  Patient Details  Name: Gail Gilbert MRN: [id number] Date of Birth: 02-Dec-1951  Today's Date: 03/02/2013 Time: 7829-5621 Time Calculation (min): 29 min  Short Term Goals: Week 1:  PT Short Term Goal 1 (Week 1): Pt will demonstrate basic transfers with S PT Short Term Goal 2 (Week 1): Pt will be able to demonstrate dynamic standing balance without UE support with steady A PT Short Term Goal 3 (Week 1): Pt will gait > 50' with min A PT Short Term Goal 4 (Week 1): Pt will propel w/c mod I on unit  Skilled Therapeutic Interventions/Progress Updates:    Functional gait in pt room with RW to/from bathroom and dynamic standing balance for hygiene, clothing management, and washing hands at sink with steady A overall. Min A with transfers, cues needed for foot placement and widening BOS as pt tending to lose balance on her own foot due to adduction. Nustep for endurance, strengthening, and reciprocal movement pattern to aid with gait training x 10 min on level 4; cues to prevent adduction of LE's.  Pt self propelled w/c to/from therapy gym on unit with S for endurance training.   Therapy Documentation Precautions:  Precautions Precautions: Fall Restrictions Weight Bearing Restrictions: No  Pain:  Reports headache is still minimal but had medication and stomach is somewhat upset. RN aware  See FIM for current functional status  Therapy/Group: Individual Therapy  Karolee Stamps [facility] 03/02/2013, 4:19 PM

## 2013-03-03 ENCOUNTER — Inpatient Hospital Stay (HOSPITAL_COMMUNITY): Payer: Medicaid Other

## 2013-03-03 ENCOUNTER — Inpatient Hospital Stay (HOSPITAL_COMMUNITY): Payer: Medicaid Other | Admitting: Occupational Therapy

## 2013-03-03 NOTE — Progress Notes (Signed)
Physical Therapy Session Note  Patient Details  Name: Gail Gilbert MRN: [id number] Date of Birth: August 31, 1951  Today's Date: 03/03/2013  Short Term Goals: Week 1:  PT Short Term Goal 1 (Week 1): Pt will demonstrate basic transfers with S PT Short Term Goal 2 (Week 1): Pt will be able to demonstrate dynamic standing balance without UE support with steady A PT Short Term Goal 3 (Week 1): Pt will gait > 50' with min A PT Short Term Goal 4 (Week 1): Pt will propel w/c mod I on unit  Session #1 Time: 1300-1345 (45 min) No pain. Gait with RW down to and back from therapy gym with min A; emphasis on step length, posture, gait pattern (not letting LE's adduct/scissor) and safety with turns. Supine LE therex to aid with mobility and strengthening with 3# ankle weights including heel slides, hip abduction, SAQ with 5 second hold, bridges with 5 second hold, and glut/quad sets with 5 second hold x 10 reps each x 3 sets bilaterally. Close S for stand step transfers and steady A with toileting in the room using RW.     Session #2: Time: 1500-1530 (30 min) No complaints of pain. Focused session on neuro re-ed to LE's, overall endurance, and functional mobility with gait training with RW and use of Nustep for reciprocal movement pattern training to aid with gait and mobility x 15 min on level 5. Pt requires min A for gait and continues to present with adduction/scissoring intermittently, ataxia, and decreased heel strike. LLE continues to invert and supinate and will continue to monitor in case bracing may be of benefit. Pt left up in w/c to organize to her room.  Therapy Documentation Precautions:  Precautions Precautions: Fall Restrictions Weight Bearing Restrictions: No  Pain: Pain Assessment Pain Score: 0-No pain   Locomotion : Ambulation Ambulation/Gait Assistance: 4: Min assist   See FIM for current functional status  Therapy/Group: Individual Therapy  Karolee Stamps [facility] 03/03/2013,  1:22 PM

## 2013-03-03 NOTE — Progress Notes (Signed)
Subjective/Complaints: Slept much better. Urinary symptoms have improved. Feels more relaxed. Had a good day with therapy A 12 point review of systems has been performed and if not noted above is otherwise negative.   Objective: Vital Signs: Blood pressure 101/65, pulse 73, temperature 98.6 F (37 C), temperature source Oral, resp. rate 20, weight 62.551 kg (137 lb 14.4 oz), SpO2 95.00%. No results found.  Recent Labs  03/02/13 0550  WBC 14.3*  HGB 12.7  HCT 36.9  PLT 309    Recent Labs  03/02/13 0550  NA 135  K 4.0  CL 99  GLUCOSE 89  BUN 18  CREATININE 0.61  CALCIUM 8.9   CBG (last 3)  No results found for this basename: GLUCAP,  in the last 72 hours  Wt Readings from Last 3 Encounters:  03/02/13 62.551 kg (137 lb 14.4 oz)  02/23/13 61.689 kg (136 lb)  08/05/12 61.689 kg (136 lb)    Physical Exam:  .Constitutional: She is oriented to person, place, and time. She appears well-developed and well-nourished.  HENT:  Head: Normocephalic and atraumatic.  Eyes: Pupils are equal, round, and reactive to light.  Neck: Normal range of motion. Neck supple.  Cardiovascular: Normal rate and regular rhythm.  Pulmonary/Chest: Effort normal and breath sounds normal.  Abdominal: Soft. Bowel sounds are normal. She exhibits no distension. There is no tenderness.  Musculoskeletal: She exhibits no edema and no tenderness.  Neurological: She is alert and oriented to person, place, and time.  BLE with ataxia and hyper reflexia. Decreased sensation RLE>LLE. Impaired proprioception. Impaired sensation BLE below trunk. Mild sensory loss in hands to forearms. UE strength grossly 5/5 except for HI which were  4+/5. LE 4/5 proximal to distal.  Skin: Skin is warm and dry.  Psychiatric: She has a normal mood and affect. Her behavior is slightly anxious.  Judgment and thought content normal.    Assessment/Plan: 1. Functional deficits secondary to cervico-thoracic transverse myelitis which  require 3+ hours per day of interdisciplinary therapy in a comprehensive inpatient rehab setting. Physiatrist is providing close team supervision and 24 hour management of active medical problems listed below. Physiatrist and rehab team continue to assess barriers to discharge/monitor patient progress toward functional and medical goals. FIM: FIM - Bathing Bathing Steps Patient Completed: Chest;Right Arm;Left Arm;Abdomen;Front perineal area;Buttocks;Right upper leg;Left upper leg;Right lower leg (including foot);Left lower leg (including foot) Bathing: 4: Steadying assist  FIM - Upper Body Dressing/Undressing Upper body dressing/undressing steps patient completed: Thread/unthread right sleeve of pullover shirt/dresss;Put head through opening of pull over shirt/dress;Thread/unthread left sleeve of pullover shirt/dress;Pull shirt over trunk Upper body dressing/undressing: 5: Set-up assist to: Obtain clothing/put away FIM - Lower Body Dressing/Undressing Lower body dressing/undressing steps patient completed: Thread/unthread right underwear leg;Thread/unthread left underwear leg;Pull underwear up/down;Thread/unthread right pants leg;Thread/unthread left pants leg;Pull pants up/down;Don/Doff right sock;Don/Doff left sock;Don/Doff right shoe;Don/Doff left shoe;Fasten/unfasten right shoe;Fasten/unfasten left shoe Lower body dressing/undressing: 4: Steadying Assist  FIM - Toileting Toileting steps completed by patient: Adjust clothing prior to toileting;Performs perineal hygiene;Adjust clothing after toileting Toileting: 4: Steadying assist  FIM - Archivist Transfers Assistive Devices: Elevated toilet seat;Grab bars;Walker Toilet Transfers: 4-To toilet/BSC: Min A (steadying Pt. > 75%);4-From toilet/BSC: Min A (steadying Pt. > 75%)  FIM - Bed/Chair Transfer Bed/Chair Transfer: 5: Supine > Sit: Supervision (verbal cues/safety issues);5: Sit > Supine: Supervision (verbal cues/safety  issues);4: Bed > Chair or W/C: Min A (steadying Pt. > 75%);4: Chair or W/C > Bed: Min A (steadying Pt. > 75%)  FIM - Locomotion: Wheelchair Locomotion: Wheelchair: 5: Travels 150 ft or more: maneuvers on rugs and over door sills with supervision, cueing or coaxing FIM - Locomotion: Ambulation Locomotion: Ambulation Assistive Devices: Designer, industrial/product Ambulation/Gait Assistance: 4: Min assist;3: Mod assist Locomotion: Ambulation: 1: Travels less than 50 ft with moderate assistance (Pt: 50 - 74%)  Comprehension Comprehension Mode: Auditory Comprehension: 6-Follows complex conversation/direction: With extra time/assistive device  Expression Expression Mode: Verbal Expression: 7-Expresses complex ideas: With no assist  Social Interaction Social Interaction: 6-Interacts appropriately with others with medication or extra time (anti-anxiety, antidepressant).  Problem Solving Problem Solving: 6-Solves complex problems: With extra time  Memory Memory: 7-Complete Independence: No helper  Medical Problem List and Plan:  1. DVT Prophylaxis/Anticoagulation: Pharmaceutical: Lovenox  2. Pain Management: continue mobic for acute on chronic back pain.  3. Mood: Continue zoloft for history of depression. Trazodone was quite helpful last night  -team to provide positive reinforcement 4. Neuropsych: This patient is capable of making decisions on his/her own behalf.  5. GU: Reports urinary urgency/burning last night. UA is +. Culture pending/ empiric cipro  6. Spasticity: continue baclofen.  7. FEN: begin MVI with Fe per pt request.--she uses Fe for RLS  LOS (Days) 2 A FACE TO FACE EVALUATION WAS PERFORMED  Gail Gilbert T 03/03/2013 8:13 AM

## 2013-03-03 NOTE — Progress Notes (Signed)
Physical Therapy Session Note  Patient Details  Name: Gail Gilbert MRN: [id number] Date of Birth: [birth year]  Today's Date: 03/03/2013 Time: 1100-1155 Time Calculation (min): 55 min  Short Term Goals: Week 1:  PT Short Term Goal 1 (Week 1): Pt will demonstrate basic transfers with S PT Short Term Goal 2 (Week 1): Pt will be able to demonstrate dynamic standing balance without UE support with steady A PT Short Term Goal 3 (Week 1): Pt will gait > 50' with min A PT Short Term Goal 4 (Week 1): Pt will propel w/c mod I on unit  Skilled Therapeutic Interventions/Progress Updates:  Treatment focused on neuro re-ed for balance training and coordination in LE's including reaching outside BOS for horseshoes to hand on basketball hoop without UE support with min to mod A (progressed to standing on compliant surface for increased challenge with some ankle instability noted) and alternating toe taps on step (mod to max A without UE support and then min A with use of RW). Gait training with RW on unit back to pt room with min A; cues for widening BOS, everting L foot, and posture.   Therapy Documentation Precautions:  Precautions Precautions: Fall Restrictions Weight Bearing Restrictions: No Pain: Pain Assessment Pain Score: 0-No pain Locomotion : Ambulation Ambulation/Gait Assistance: 4: Min assist   See FIM for current functional status  Therapy/Group: Individual Therapy  Karolee Stamps [facility] 03/03/2013, 12:21 PM

## 2013-03-03 NOTE — Progress Notes (Signed)
Occupational Therapy Session Note  Patient Details  Name: Gail Gilbert MRN: [id number] Date of Birth: August 03, 1951  Today's Date: 03/03/2013 Time: 1000-1100 Time Calculation (min): 60 min  Short Term Goals: Week 1:  OT Short Term Goal 1 (Week 1): Short Term Goals = Long Term Goals  Skilled Therapeutic Interventions/Progress Updates:  Patient found seated in recliner upon entering room. Patient engaged in functional mobility using rolling walker in order to gather necessary items for ADL. Patient then stood at sink to brush teeth and wash face (simulated like at home). Patient then sat in w/c for donning of make-up (simulated like at home). UB/LB dressing performed next, patient declined shower stating her skin was too dry(she states she normally takes showers every other day at home). Focused skilled intervention on sit<>stands, functional mobility with use of rolling walker, safety with rolling walker, dynamic standing balance/tolerance/endurance, overall activity tolerance/endurance, toilet transfer, toileting, and functional use of bilateral UEs/hands. At end of session left patient seated in w/c to maneuver around room prn.   Precautions:  Precautions Precautions: Fall Restrictions Weight Bearing Restrictions: No  See FIM for current functional status  Therapy/Group: Individual Therapy  Hansen Carino 03/03/2013, 11:15 AM

## 2013-03-03 NOTE — Progress Notes (Signed)
Recreational Therapy Assessment and Plan  Patient Details  Name: ZELMA SNEAD MRN: [id number] Date of Birth: [birth year] Today's Date: 03/03/2013  Rehab Potential: Good ELOS: 10 days   Assessment Clinical Impression: Problem List:  Patient Active Problem List   Diagnosis   .  Spondylolisthesis   .  RLS (restless legs syndrome)   .  Anxiety   .  Leg weakness, bilateral   .  Myelitis   .  Urinary retention   .  Idiopathic transverse myelitis    Past Medical History:  Past Medical History   Diagnosis  Date   .  Arthritis      cervical,feet   .  Depression    .  Migraine    .  GERD (gastroesophageal reflux disease)  12/2006     esophageal stricture   .  Vitamin D deficiency    .  MVP (mitral valve prolapse)    .  Spondylolisthesis      L4-5   .  RLS (restless legs syndrome)    .  Anxiety    .  Interstitial cystitis    .  IBS (irritable bowel syndrome)    .  Insomnia    .  DVT (deep vein thrombosis) in pregnancy  1989     postpartum    Past Surgical History:  Past Surgical History   Procedure  Laterality  Date   .  Colonoscopy     .  Bunionectomy   09,05     both feet   .  Upper gastrointestinal endoscopy     .  Ear examination under anesthesia       to correct ringing in ear at [facility]   .  Lumbar aspiration and drainage       cyst   .  Hammer toe surgery   10/29/2011     Procedure: HAMMER TOE CORRECTION; Surgeon: Nestor Lewandowsky; Location: [facility]; Service: Orthopedics; Laterality: Left; Left 2nd toe duvries arthroplasty   .  Decompressive lumbar laminectomy level 4   1999    Assessment & Plan  Clinical Impression: Patient is a 62 y.o. year old right-handed female with history of low back pain status post lumbar L4 decompression laminectomy 1999, restless leg syndrome.. Admitted 02/21/2011 with tingling in her feet and lower extremity weakness with staggering gait as well his right upper extremity numbness and weakness. She denied any visual  changes or slurred speech. Patient did note some increased urinary retention. MRI of the brain with no acute infarct or mass. There was some chronic microvascular ischemia. MRI cervical thoracic lumbar spine with no cord compression. Neurology services consulted with suspect transverse myelitis as patient was noted to have marked proximal weakness BLE with sustained clonus at ankles as well as hyperreflexia BUE with mild distal weakness R>L hand. Solu-Medrol. She was treated with IV solumedrol and baclofen was added for spasticity. LP done-CSF- glucose-100, few WBC, culture negative. HIV, GC probe and RPR-negative, Vit B 12 levels-765, Vit D1-76, ANA-negative, GAD<1.0, And TSH-1.001. Neurology recommended repeat MRI for follow up. MRI neck 04/13 showed abnormal signal with evidence of myelopathy in posterior and lateral columns at C3-C5 and MRI Lumbar spine with lateral stenosis L3-4, L4-5 advanced facet arthropathy with anterolisthesis 1 cm, some enhancement in L3-L4 dorsal space likely due to degenerative disease and associated epidural inflammation. Neurology feels that transverse myelitis remains in differential of etiology. Patient transferred to CIR on 03/01/2013.  Pt presents with decreased activity tolerance, decreased functional  mobility, decreased balance, & ataxia Limiting pt's independence with leisure/community pursuits.  Leisure History/Participation Premorbid leisure interest/current participation: Sports - Exercise (Comment);Ashby Dawes - Flower gardening;Nature - Vegetable gardening;Nature - Earnestine Leys care;Community - Grocery store;Community - Press photographer - Travel (Comment) (cycling on trails) Leisure Participation Style: Alone;With Family/Friends Awareness of Community Resources: Good-identify 3 post discharge leisure resources Psychosocial / Spiritual Social interaction - Mood/Behavior: Cooperative Firefighter Appropriate for Education?: Yes Patient Agreeable to [employer]?:  Yes Recreational Therapy Orientation Orientation -Reviewed with patient: Available activity resources Strengths/Weaknesses Patient Strengths/Abilities: Willingness to participate;Active premorbidly  Plan Rec Therapy Plan Is patient appropriate for Therapeutic Recreation?: Yes Rehab Potential: Good Treatment times per week: Min 1 time per week >20 minutes Estimated Length of Stay: 10 days TR Treatment/Interventions: Adaptive equipment instruction;1:1 session;Balance/vestibular training;Functional mobility training;Community reintegration;Patient/family education;Therapeutic activities;Recreation/leisure participation;Therapeutic exercise;UE/LE Coordination activities  Recommendations for other services: None  Discharge Criteria: Patient will be discharged from TR if patient refuses treatment 3 consecutive times without medical reason.  If treatment goals not met, if there is a change in medical status, if patient makes no progress towards goals or if patient is discharged from hospital.  The above assessment, treatment plan, treatment alternatives and goals were discussed and mutually agreed upon: by patient  Alyviah Crandle 03/03/2013, 12:11 PM

## 2013-03-03 NOTE — Progress Notes (Signed)
Recreational Therapy Session Note  Patient Details  Name: Gail Gilbert MRN: [id number] Date of Birth: May 18, 1951 Today's Date: 03/03/2013 Time:  1130-1155 Pain: no c/o Skilled Therapeutic Interventions/Progress Updates: Session focused on dynamic standing balance, standing endurance, B knee control.  Pt stood on tile and later foam mat surface reaching outside BOS in all planes for horseshoe activity with min-mod assist.  Ankle instability noted.  Therapy/Group: Co-Treatment  Becka Lagasse 03/03/2013, 3:20 PM

## 2013-03-04 ENCOUNTER — Inpatient Hospital Stay (HOSPITAL_COMMUNITY): Payer: Medicaid Other | Admitting: Physical Therapy

## 2013-03-04 ENCOUNTER — Inpatient Hospital Stay (HOSPITAL_COMMUNITY): Payer: Medicaid Other | Admitting: *Deleted

## 2013-03-04 DIAGNOSIS — F341 Dysthymic disorder: Secondary | ICD-10-CM

## 2013-03-04 DIAGNOSIS — N390 Urinary tract infection, site not specified: Secondary | ICD-10-CM

## 2013-03-04 DIAGNOSIS — G373 Acute transverse myelitis in demyelinating disease of central nervous system: Secondary | ICD-10-CM

## 2013-03-04 LAB — URINE CULTURE: Colony Count: >=100000

## 2013-03-04 NOTE — Progress Notes (Signed)
Physical Therapy Session Note  Patient Details  Name: Gail Gilbert MRN: [id number] Date of Birth: 1951-07-05  Today's Date: 03/04/2013 Time: 0815-0858 Time Calculation (min): 43 min  Short Term Goals: Week 1:  PT Short Term Goal 1 (Week 1): Pt will demonstrate basic transfers with S PT Short Term Goal 2 (Week 1): Pt will be able to demonstrate dynamic standing balance without UE support with steady A PT Short Term Goal 3 (Week 1): Pt will gait > 50' with min A PT Short Term Goal 4 (Week 1): Pt will propel w/c mod I on unit  Skilled Therapeutic Interventions/Progress Updates:    Session focused on neuromuscular re-education for improved gait mechanics. Narrowed focus today to decreasing Rt. Trendelenburg during Rt. Stance and Lt. Hip retraction and Lt. Internal hip rotation during Lt. LE advancement and early stance. Practiced side step up/downs with Rt. focusing on controlling concentric and eccentric control of pelvic and trunk stabilizers. Ambulated with RW to carry over practice, PT facilitating Rt. Hip abductors at appropriate timing. Pt then ambulated to add Lt. Pelvic rotation with Lt. LE advancement. PT to provide facilitation of appropriate musculature and to provide verbal cues. All ambulation with RW and min assist. She is unable to obtain a heel strike at this time.  Pt requires supervision for sit <> stand.  Therapy Documentation Precautions:  Precautions Precautions: Fall Restrictions Weight Bearing Restrictions: No Pain: Pain Assessment Pain Assessment: No/denies pain See FIM for current functional status  Therapy/Group: Individual Therapy  Wilhemina Bonito 03/04/2013, 11:59 AM

## 2013-03-04 NOTE — Progress Notes (Signed)
Physical Therapy Session Note  Patient Details  Name: ADAISHA CAMPISE MRN: [id number] Date of Birth: [birth year]  Today's Date: 03/04/2013 Time: 6295-2841 Time Calculation (min): 44 min  Short Term Goals: Week 1:  PT Short Term Goal 1 (Week 1): Pt will demonstrate basic transfers with S PT Short Term Goal 2 (Week 1): Pt will be able to demonstrate dynamic standing balance without UE support with steady A PT Short Term Goal 3 (Week 1): Pt will gait > 50' with min A PT Short Term Goal 4 (Week 1): Pt will propel w/c mod I on unit  Skilled Therapeutic Interventions/Progress Updates:    Ambulation with RW x 120', 180' with min assist and cues for carryover from neuromuscular re-ed this morning. PT facilitated Rt. Hip abductors to promote pelvic stability during Rt. Stance. PT facilitated Lt. Pelvic rotation during Lt. Swing phase. Ambulation 3 x 40' without device for balance and strengthening, constant moderate assist when ambulating without assist secondary to impaired balance reactions/proprioception, worse with turning. Standing balance tasks tossing ball and catching with mitt while varying base of support (normal stance, narrow, modified tandem) for increased difficulty. Pt needed up to moderate assist to prevent falling. Standing bil. Hip abduction with elbows on table to target bil. hip stabilizers and strengthen them, 5 x 10 reps each. Pt has most difficulty with pelvic stability on Rt.   Therapy Documentation Precautions:  Precautions Precautions: Fall Restrictions Weight Bearing Restrictions: No Pain: Pain Assessment Pain Assessment: No/denies pain  See FIM for current functional status  Therapy/Group: Individual Therapy  Wilhemina Bonito 03/04/2013, 3:57 PM

## 2013-03-04 NOTE — Progress Notes (Signed)
[facility] Individual Statement of Services  Patient Name:  Gail Gilbert  Date:  03/04/2013  Welcome to the [facility].  Our goal is to provide you with an individualized program based on your diagnosis and situation, designed to meet your specific needs.  With this comprehensive rehabilitation program, you will be expected to participate in at least 3 hours of rehabilitation therapies Monday-Friday, with modified therapy programming on the weekends.  Your rehabilitation program will include the following services:  Physical Therapy (PT), Occupational Therapy (OT), 24 hour per day rehabilitation nursing, Therapeutic Recreaction (TR), Case Management (Social Worker), Rehabilitation Medicine, Nutrition Services and Pharmacy Services  Weekly team conferences will be held on Tuesdays to discuss your progress.  Your  Social Worker will talk with you frequently to get your input and to update you on team discussions.  Team conferences with you and your family in attendance may also be held.  Expected length of stay: 10 days  Overall anticipated outcome: supervision to modified                                                                                                                         independent  Depending on your progress and recovery, your program may change.  Your  Social Worker will coordinate services and will keep you informed of any changes.  Your Social Worker's name and contact numbers are listed  below.  The following services may also be recommended but are not provided by the [facility]:   Driving Evaluations  Home Health Rehabiltiation Services  Outpatient Rehabilitatation [facility]  Vocational Rehabilitation   Arrangements will be made to provide these services after discharge if needed.  Arrangements include referral to agencies that provide these services.  Your insurance has been verified to be:  Medicaid  application pending Your primary doctor [facility] ([address]  Pertinent information will be shared with your doctor and your insurance company.  Social Worker:  [place], [place] [phone] or ([phone]  Information discussed with and copy given to patient by: Amada Jupiter, 03/04/2013, 3:46 PM

## 2013-03-04 NOTE — Progress Notes (Signed)
Social Work  Social Work Assessment and Plan  Patient Details  Name: Gail Gilbert MRN: [id number] Date of Birth: [birth year]  Today's Date: 03/04/2013  Problem List:  Patient Active Problem List  Diagnosis  . Spondylolisthesis  . RLS (restless legs syndrome)  . Anxiety  . Leg weakness, bilateral  . Myelitis  . Urinary retention  . Idiopathic transverse myelitis  . UTI (urinary tract infection) due to E. coli   Past Medical History:  Past Medical History  Diagnosis Date  . Arthritis     cervical,feet  . Depression   . Migraine   . GERD (gastroesophageal reflux disease) 12/2006    esophageal stricture  . Vitamin D deficiency   . MVP (mitral valve prolapse)   . Spondylolisthesis     L4-5  . RLS (restless legs syndrome)   . Anxiety   . Interstitial cystitis   . IBS (irritable bowel syndrome)   . Insomnia   . DVT (deep vein thrombosis) in pregnancy 1989    postpartum   Past Surgical History:  Past Surgical History  Procedure Laterality Date  . Colonoscopy    . Bunionectomy  09,05    both feet  . Upper gastrointestinal endoscopy    . Ear examination under anesthesia      to correct ringing in ear at [facility]  . Lumbar aspiration and drainage      cyst  . Hammer toe surgery  10/29/2011    Procedure: HAMMER TOE CORRECTION;  Surgeon: Nestor Lewandowsky;  Location: [facility];  Service: Orthopedics;  Laterality: Left;  Left 2nd toe duvries arthroplasty  . Decompressive lumbar laminectomy level 4  1999   Social History:  reports that she has never smoked. She has never used smokeless tobacco. She reports that  drinks alcohol. She reports that she does not use illicit drugs.  Family / Support Systems Marital Status: Divorced How Long?: 3 months Patient Roles: Parent (has 4 sons) Children: son, Beatriz Chancellor @ [phone] and daughter-in-law, Marylene Land @ ([phone] Other Supports: plus 4 other adult children with two more local, one in [place] and one  living in [place] Anticipated Caregiver: son's mom in law in attatched in [employer] prn when son and his wife work Ability/Limitations of Caregiver: son works, Chartered loss adjuster in Social worker in nursing school, son's mother lin law cares for the grandkids ( 1, 5, and 7) Caregiver Availability: Intermittent Family Dynamics: Pt describes very good relationship with all family and with son's mother-in-law.  Social History Preferred language: English Religion:  Cultural Background: NA Education: 3 semesters of college Read: Yes Write: Yes Employment Status: Unemployed Date Retired/Disabled/Unemployed: since Nov. 2012 Legal Hisotry/Current Legal Issues: None Guardian/Conservator: None   Abuse/Neglect Physical Abuse: Denies Verbal Abuse: Denies Sexual Abuse: Denies Exploitation of patient/patient's resources: Denies Self-Neglect: Denies  Emotional Status Pt's affect, behavior adn adjustment status: pt very pleasant, talkative and fully oriented.  Denies any significant emotional distress, however, admits concern over her medical bills and living expenses (she was in process of looking for a job when she became ill).  Denies any s/s of depression or anxiety. Recent Psychosocial Issues: was recently in [place] to assist in care of elderly parents and recently divorced after brief marriage of just a few months Pyschiatric History: None - notes pirmary MD placed her on Zoloft, however, no formal diagnosis or treatment Substance Abuse History: none  Patient / Family Perceptions, Expectations & Goals Pt/Family understanding of illness & functional limitations: Pt reports  that MDs "...have settled on saying 'idiopathic myelopathy vs. transverse myelitis". Good understanding of current functional limitations and need for CIR. Premorbid pt/family roles/activities: pt was living independently and had been providing support to parents recently. Actively pursuing a new job Anticipated changes in  roles/activities/participation: may initially need some assistance at home;  to stay with son and daughter-in-law "as long as I need to" Pt/family expectations/goals: "i would really like to get back to my new apartment as soon as possible."  [employer]: None Premorbid Home Care/DME Agencies: None Transportation available at discharge: yes  Discharge Planning Living Arrangements: Alone Support Systems: Children;Other relatives Type of Residence: Private residence Insurance Resources: Self-pay Financial Screen Referred: Previously completed Living Expenses: Rent Money Management: Patient Do you have any problems obtaining your medications?: Yes (Describe) (no insurance) Home Management: pt Patient/Family Preliminary Plans: pt plans to d/c to son's home where family will assist as needed Social Work Anticipated Follow Up Needs: HH/OP Expected length of stay: 10 days  Clinical Impression Very pleasant woman here with TM vs. Idiopathic myelopathy.  Very motivated.  Concerns over finances and no insurance.  Denies any significant emotional distress.  Will follow for assistance with d/c planning needs.  Adair Lemar 03/04/2013, 4:07 PM

## 2013-03-04 NOTE — Progress Notes (Signed)
Subjective/Complaints: Overall progressing. Some anxiety at times. Has lost kindle. Last saw it on bed. A 12 point review of systems has been performed and if not noted above is otherwise negative.   Objective: Vital Signs: Blood pressure 114/62, pulse 76, temperature 98.3 F (36.8 C), temperature source Oral, resp. rate 16, weight 62.551 kg (137 lb 14.4 oz), SpO2 96.00%. No results found.  Recent Labs  03/02/13 0550  WBC 14.3*  HGB 12.7  HCT 36.9  PLT 309    Recent Labs  03/02/13 0550  NA 135  K 4.0  CL 99  GLUCOSE 89  BUN 18  CREATININE 0.61  CALCIUM 8.9   CBG (last 3)  No results found for this basename: GLUCAP,  in the last 72 hours  Wt Readings from Last 3 Encounters:  03/02/13 62.551 kg (137 lb 14.4 oz)  02/23/13 61.689 kg (136 lb)  08/05/12 61.689 kg (136 lb)    Physical Exam:  .Constitutional: She is oriented to person, place, and time. She appears well-developed and well-nourished.  HENT:  Head: Normocephalic and atraumatic.  Eyes: Pupils are equal, round, and reactive to light.  Neck: Normal range of motion. Neck supple.  Cardiovascular: Normal rate and regular rhythm.  Pulmonary/Chest: Effort normal and breath sounds normal.  Abdominal: Soft. Bowel sounds are normal. She exhibits no distension. There is no tenderness.  Musculoskeletal: She exhibits no edema and no tenderness.  Neurological: She is alert and oriented to person, place, and time.  BLE with improved coordination. Still with impaired sensation RLE>LLE. Impaired proprioception. Impaired sensation BLE below trunk. Mild sensory loss in hands to forearms. UE strength grossly 5/5 except for HI which were  4+/5. LE 4/5 proximal to distal.  Skin: Skin is warm and dry.  Psychiatric: She has a normal mood and affect. Her behavior is slightly anxious.  Judgment and thought content normal.    Assessment/Plan: 1. Functional deficits secondary to cervico-thoracic transverse myelitis which require 3+  hours per day of interdisciplinary therapy in a comprehensive inpatient rehab setting. Physiatrist is providing close team supervision and 24 hour management of active medical problems listed below. Physiatrist and rehab team continue to assess barriers to discharge/monitor patient progress toward functional and medical goals. FIM: FIM - Bathing Bathing Steps Patient Completed: Chest;Right Arm;Left Arm;Abdomen;Front perineal area;Buttocks;Right upper leg;Left upper leg;Right lower leg (including foot);Left lower leg (including foot) Bathing: 4: Steadying assist  FIM - Upper Body Dressing/Undressing Upper body dressing/undressing steps patient completed: Thread/unthread right sleeve of pullover shirt/dresss;Thread/unthread left sleeve of pullover shirt/dress;Put head through opening of pull over shirt/dress;Pull shirt over trunk Upper body dressing/undressing: 5: Set-up assist to: Obtain clothing/put away FIM - Lower Body Dressing/Undressing Lower body dressing/undressing steps patient completed: Thread/unthread left underwear leg;Thread/unthread right underwear leg;Pull underwear up/down;Thread/unthread left pants leg;Pull pants up/down;Thread/unthread right pants leg;Don/Doff right sock;Don/Doff left sock;Don/Doff right shoe;Don/Doff left shoe;Fasten/unfasten right shoe;Fasten/unfasten left shoe Lower body dressing/undressing: 4: Steadying Assist  FIM - Toileting Toileting steps completed by patient: Adjust clothing prior to toileting;Performs perineal hygiene;Adjust clothing after toileting Toileting: 4: Steadying assist  FIM - Diplomatic Services operational officer Devices: Elevated toilet seat;Walker;Grab bars Toilet Transfers: 4-To toilet/BSC: Min A (steadying Pt. > 75%);4-From toilet/BSC: Min A (steadying Pt. > 75%)  FIM - Bed/Chair Transfer Bed/Chair Transfer: 5: Supine > Sit: Supervision (verbal cues/safety issues);5: Sit > Supine: Supervision (verbal cues/safety issues);4: Bed >  Chair or W/C: Min A (steadying Pt. > 75%);4: Chair or W/C > Bed: Min A (steadying Pt. > 75%)  FIM -  Locomotion: Wheelchair Locomotion: Wheelchair: 5: Travels 150 ft or more: maneuvers on rugs and over door sills with supervision, cueing or coaxing FIM - Locomotion: Ambulation Locomotion: Ambulation Assistive Devices: Designer, industrial/product Ambulation/Gait Assistance: 4: Min assist Locomotion: Ambulation: 4: Travels 150 ft or more with minimal assistance (Pt.>75%)  Comprehension Comprehension Mode: Auditory Comprehension: 6-Follows complex conversation/direction: With extra time/assistive device  Expression Expression Mode: Verbal Expression: 7-Expresses complex ideas: With no assist  Social Interaction Social Interaction: 6-Interacts appropriately with others with medication or extra time (anti-anxiety, antidepressant).  Problem Solving Problem Solving: 6-Solves complex problems: With extra time  Memory Memory: 6-More than reasonable amt of time  Medical Problem List and Plan:  1. DVT Prophylaxis/Anticoagulation: Pharmaceutical: Lovenox  2. Pain Management: continue mobic for acute on chronic back pain.  3. Mood: Continue zoloft for history of depression. Trazodone has been helpful for sleep  -team to provide positive reinforcement 4. Neuropsych: This patient is capable of making decisions on his/her own behalf.  5. GU: E coli UTI--day 3 cipro. Sx better  6. Spasticity: continue baclofen.  7. FEN:   MVI with Fe per pt request.--she uses Fe for RLS  LOS (Days) 3 A FACE TO FACE EVALUATION WAS PERFORMED  Kieth Hartis T 03/04/2013 9:02 AM

## 2013-03-04 NOTE — Progress Notes (Signed)
Occupational Therapy Note  Patient Details  Name: Gail Gilbert MRN: [id number] Date of Birth: 1951-11-30 Today's Date: 03/04/2013  Time:  0900-1000  (60 min)  1st session  Individual session Pain:  None  1st session:  Pt. Sitting in wc.  Pt. Ambulated to shower seat with minimal contact guard assist.  Transferred with RW and sat on seat.  OT skilled intervention on sit to stands, functional mobility with RW, safety with transitional movements, dynamic standing balance, activity tolerance, endurance, BUE AROM and strengthening.  Pt. Bathed and dressed with set up assist and supervision with sit to stand.      2nd session:  Time:  1530-1630  ( ) Pain:  None Performed functional mobility from room (4009) to rehab gym with minimal assist for positioning legs, not crossing over, heel toe stride, and symmetry through the hips.  Performed therapeutic exercises on mat with core stabilization, LE strengthening, and balance.  Pt. Able to assume quadriped position and balance on each extremity for 5 sec with minimal assist when weight bearing on left LE.  Pt held each position for 5 seconds.  Practiced ambulation to ADL apartment and simulation of salad prep.   Pt reached down in lower drawers to obtain items with minimal steadying assist.  Pt. Picked items off floor with no LOB.  Pt. Used counter frequently for balance during session.    Ambulated back to 4009 with cues to slow pace, take smaller steps.  Humberto Seals 03/04/2013, 9:29 AM

## 2013-03-05 ENCOUNTER — Inpatient Hospital Stay (HOSPITAL_COMMUNITY): Payer: Medicaid Other | Admitting: Physical Therapy

## 2013-03-05 ENCOUNTER — Inpatient Hospital Stay (HOSPITAL_COMMUNITY): Payer: Medicaid Other | Admitting: *Deleted

## 2013-03-05 MED ORDER — TRAMADOL HCL 50 MG PO TABS: 50.0000 mg | ORAL_TABLET | Freq: Four times a day (QID) | ORAL | Status: DC | PRN

## 2013-03-05 NOTE — Progress Notes (Signed)
Occupational Therapy Session Note  Patient Details  Name: ANNALISE MCDIARMID MRN: [id number] Date of Birth: [birth year]  Today's Date: 03/05/2013 Time: 0930-1015  ( )   1st session Time Calculation (min): 45 min  Short Term Goals: Week 1:  OT Short Term Goal 1 (Week 1): Short Term Goals = Long Term Goals  Skilled Therapeutic Interventions/Progress Updates:    1st session:   Pt. Sitting in wc.  Utilized RW during ADL to gather supplies, stand at sink and perform grooming.  Pt utilized RW for about 5 minutes before reverting to wc.  Discussed energy conservation strategies when gathering supplies.  Addressed balance in picking up items off floor, walker safety, endurance, functional mobility.      2nd session:   1600-1645  ( )  Individual session.  No pain.   Utilized biodex for balance activities.  Pt. Needed increased time for balance activities but did without using hands.    Pt. Ambulated down to gym with RW and supervision and back to room.  Using better balance and heel/toe stride with walking.     Therapy Documentation Precautions:  Precautions Precautions: Fall Restrictions Weight Bearing Restrictions: No      Pain:  0/10       See FIM for current functional status  Therapy/Group: Individual Therapy  Humberto Seals 03/05/2013, 9:50 AM

## 2013-03-05 NOTE — Progress Notes (Signed)
Physical Therapy Note  Patient Details  Name: Gail Gilbert MRN: [id number] Date of Birth: [birth year] Today's Date: 03/05/2013  1300-1355 (55 minutes) group Pain: no reported pain Pt participated in PT group session focused on gait training/safety/endurance/strengthening. Pt ambulates 80 feet RW min assist X 1. Therapeutic exercises as follows: quadruped, tall kneeling, tall kneeling with diagonal arm movement using therapy ball, half kneel (pt unable to perform without loss of balance); sidelying hip abductions.    Jacquetta Polhamus,JIM 03/05/2013, 2:10 PM

## 2013-03-05 NOTE — Progress Notes (Signed)
Subjective/Complaints: Still with balance issues. Denies pain. Found kindle A 12 point review of systems has been performed and if not noted above is otherwise negative.   Objective: Vital Signs: Blood pressure 114/62, pulse 76, temperature 98.3 F (36.8 C), temperature source Oral, resp. rate 16, weight 62.551 kg (137 lb 14.4 oz), SpO2 96.00%. No results found. No results found for this basename: WBC, HGB, HCT, PLT,  in the last 72 hours No results found for this basename: NA, K, CL, CO, GLUCOSE, BUN, CREATININE, CALCIUM,  in the last 72 hours CBG (last 3)  No results found for this basename: GLUCAP,  in the last 72 hours  Wt Readings from Last 3 Encounters:  03/02/13 62.551 kg (137 lb 14.4 oz)  02/23/13 61.689 kg (136 lb)  08/05/12 61.689 kg (136 lb)    Physical Exam:  .Constitutional: She is oriented to person, place, and time. She appears well-developed and well-nourished.  HENT:  Head: Normocephalic and atraumatic.  Eyes: Pupils are equal, round, and reactive to light.  Neck: Normal range of motion. Neck supple.  Cardiovascular: Normal rate and regular rhythm.  Pulmonary/Chest: Effort normal and breath sounds normal.  Abdominal: Soft. Bowel sounds are normal. She exhibits no distension. There is no tenderness.  Musculoskeletal: She exhibits no edema and no tenderness.  Neurological: She is alert and oriented to person, place, and time.  BLE with improved coordination. Still with impaired sensation RLE>LLE. Impaired proprioception. Impaired sensation BLE below trunk. Mild sensory loss in hands to forearms. UE strength grossly 5/5 except for HI which were  4+/5. LE 4/5 proximal to distal.  Skin: Skin is warm and dry.  Psychiatric: She has a normal mood and affect. Her behavior is slightly anxious.  Judgment and thought content normal.    Assessment/Plan: 1. Functional deficits secondary to cervico-thoracic transverse myelitis which require 3+ hours per day of  interdisciplinary therapy in a comprehensive inpatient rehab setting. Physiatrist is providing close team supervision and 24 hour management of active medical problems listed below. Physiatrist and rehab team continue to assess barriers to discharge/monitor patient progress toward functional and medical goals. FIM: FIM - Bathing Bathing Steps Patient Completed: Chest;Right Arm;Left Arm;Abdomen;Front perineal area;Buttocks;Right upper leg;Left upper leg;Right lower leg (including foot);Left lower leg (including foot) Bathing: 4: Steadying assist  FIM - Upper Body Dressing/Undressing Upper body dressing/undressing steps patient completed: Thread/unthread right sleeve of pullover shirt/dresss;Thread/unthread left sleeve of pullover shirt/dress;Put head through opening of pull over shirt/dress;Pull shirt over trunk Upper body dressing/undressing: 5: Set-up assist to: Obtain clothing/put away FIM - Lower Body Dressing/Undressing Lower body dressing/undressing steps patient completed: Thread/unthread left underwear leg;Thread/unthread right underwear leg;Pull underwear up/down;Thread/unthread left pants leg;Pull pants up/down;Thread/unthread right pants leg;Don/Doff right sock;Don/Doff left sock;Don/Doff right shoe;Don/Doff left shoe;Fasten/unfasten right shoe;Fasten/unfasten left shoe Lower body dressing/undressing: 4: Steadying Assist  FIM - Toileting Toileting steps completed by patient: Adjust clothing prior to toileting;Performs perineal hygiene;Adjust clothing after toileting Toileting: 4: Steadying assist  FIM - Archivist Transfers Assistive Devices: Elevated toilet seat;Grab bars;Walker Toilet Transfers: 4-To toilet/BSC: Min A (steadying Pt. > 75%);4-From toilet/BSC: Min A (steadying Pt. > 75%)  FIM - Bed/Chair Transfer Bed/Chair Transfer Assistive Devices: Bed rails;Arm rests Bed/Chair Transfer: 5: Supine > Sit: Supervision (verbal cues/safety issues);5: Sit > Supine:  Supervision (verbal cues/safety issues);4: Bed > Chair or W/C: Min A (steadying Pt. > 75%);4: Chair or W/C > Bed: Min A (steadying Pt. > 75%)  FIM - Locomotion: Wheelchair Locomotion: Wheelchair: 5: Travels 150 ft or more: maneuvers  on rugs and over door sills with supervision, cueing or coaxing FIM - Locomotion: Ambulation Locomotion: Ambulation Assistive Devices: Walker - Rolling Ambulation/Gait Assistance: 4: Min assist Locomotion: Ambulation: 4: Travels 150 ft or more with minimal assistance (Pt.>75%)  Comprehension Comprehension Mode: Auditory Comprehension: 6-Follows complex conversation/direction: With extra time/assistive device  Expression Expression Mode: Verbal Expression: 7-Expresses complex ideas: With no assist  Social Interaction Social Interaction: 6-Interacts appropriately with others with medication or extra time (anti-anxiety, antidepressant).  Problem Solving Problem Solving: 6-Solves complex problems: With extra time  Memory Memory: 7-Complete Independence: No helper  Medical Problem List and Plan:  1. DVT Prophylaxis/Anticoagulation: Pharmaceutical: Lovenox  2. Pain Management: continue mobic for acute on chronic back pain.  3. Mood: Continue zoloft for history of depression. Trazodone has been helpful for sleep  -team providing positive reinforcement 4. Neuropsych: This patient is capable of making decisions on his/her own behalf.  5. GU: E coli UTI--day 3 cipro. Sx better  6. Spasticity: continue baclofen.  7. FEN:   MVI with Fe per pt request.--she uses Fe for RLS  LOS (Days) 4 A FACE TO FACE EVALUATION WAS PERFORMED  Donaldson Richter T 03/05/2013 8:08 AM

## 2013-03-05 NOTE — Progress Notes (Signed)
Physical Therapy Session Note  Patient Details  Name: Gail Gilbert MRN: [id number] Date of Birth: 04-27-1951  Today's Date: 03/05/2013 Time: 0830-0930 Time Calculation (min): 60 min  Short Term Goals: Week 1:  PT Short Term Goal 1 (Week 1): Pt will demonstrate basic transfers with S PT Short Term Goal 2 (Week 1): Pt will be able to demonstrate dynamic standing balance without UE support with steady A PT Short Term Goal 3 (Week 1): Pt will gait > 50' with min A PT Short Term Goal 4 (Week 1): Pt will propel w/c mod I on unit  Therapy Documentation Precautions:  Precautions: Fall Restrictions Weight Bearing Restrictions: No Pain: Pain Assessment Pain Assessment: No/denies pain  Therapeutic Activity:(30') Bed mobility S/min-A for rolling, L side lying to sit using bed rail Therapeutic Exercise:(15') Nu-Step x 10' Level 6 with > 50 steps/minute, supine bridging with theraband-resisted hip abduction Gait Training:(15') using RW 4 x 120' with emphasis on foot placement and stride length as well as R hip stabilization during stance phase  See FIM for current functional status  Therapy/Group: Individual Therapy  Rex Kras 03/05/2013, 8:51 AM

## 2013-03-06 ENCOUNTER — Inpatient Hospital Stay (HOSPITAL_COMMUNITY): Payer: Medicaid Other | Admitting: *Deleted

## 2013-03-06 LAB — MISCELLANEOUS TEST: Miscellaneous Test: <1:800 {titer}

## 2013-03-06 NOTE — Progress Notes (Signed)
Subjective/Complaints: No new complaints.  A 12 point review of systems has been performed and if not noted above is otherwise negative.   Objective: Vital Signs: Blood pressure 106/72, pulse 73, temperature 98.2 F (36.8 C), temperature source Oral, resp. rate 16, weight 62.551 kg (137 lb 14.4 oz), SpO2 98.00%. No results found. No results found for this basename: WBC, HGB, HCT, PLT,  in the last 72 hours No results found for this basename: NA, K, CL, CO, GLUCOSE, BUN, CREATININE, CALCIUM,  in the last 72 hours CBG (last 3)  No results found for this basename: GLUCAP,  in the last 72 hours  Wt Readings from Last 3 Encounters:  03/02/13 62.551 kg (137 lb 14.4 oz)  02/23/13 61.689 kg (136 lb)  08/05/12 61.689 kg (136 lb)    Physical Exam:  .Constitutional: She is oriented to person, place, and time. She appears well-developed and well-nourished.  HENT:  Head: Normocephalic and atraumatic.  Eyes: Pupils are equal, round, and reactive to light.  Neck: Normal range of motion. Neck supple.  Cardiovascular: Normal rate and regular rhythm.  Pulmonary/Chest: Effort normal and breath sounds normal.  Abdominal: Soft. Bowel sounds are normal. She exhibits no distension. There is no tenderness.  Musculoskeletal: She exhibits no edema and no tenderness.  Neurological: She is alert and oriented to person, place, and time.  BLE with improved coordination. Still with impaired sensation RLE>LLE. Impaired proprioception. Impaired sensation BLE below trunk. Mild sensory loss in hands to forearms. UE strength grossly 5/5 except for HI which were  4+/5. LE 4/5 proximal to distal. Walks with steppage gait pattern, recurvatum with stance. Skin: Skin is warm and dry.  Psychiatric: She has a normal mood and affect. Her behavior is slightly anxious.  Judgment and thought content normal.    Assessment/Plan: 1. Functional deficits secondary to cervico-thoracic transverse myelitis which require 3+ hours per  day of interdisciplinary therapy in a comprehensive inpatient rehab setting. Physiatrist is providing close team supervision and 24 hour management of active medical problems listed below. Physiatrist and rehab team continue to assess barriers to discharge/monitor patient progress toward functional and medical goals. FIM: FIM - Bathing Bathing Steps Patient Completed: Chest;Right Arm;Left Arm;Abdomen;Front perineal area;Buttocks;Right upper leg;Left upper leg;Right lower leg (including foot);Left lower leg (including foot) Bathing: 4: Steadying assist  FIM - Upper Body Dressing/Undressing Upper body dressing/undressing steps patient completed: Thread/unthread right sleeve of pullover shirt/dresss;Thread/unthread left sleeve of pullover shirt/dress;Put head through opening of pull over shirt/dress;Pull shirt over trunk Upper body dressing/undressing: 5: Supervision: Safety issues/verbal cues FIM - Lower Body Dressing/Undressing Lower body dressing/undressing steps patient completed: Thread/unthread right pants leg;Thread/unthread left pants leg;Pull underwear up/down;Thread/unthread left underwear leg;Thread/unthread right underwear leg;Pull pants up/down Lower body dressing/undressing: 5: Supervision: Safety issues/verbal cues  FIM - Toileting Toileting steps completed by patient: Adjust clothing prior to toileting;Performs perineal hygiene;Adjust clothing after toileting Toileting Assistive Devices: Grab bar or rail for support Toileting: 5: Supervision: Safety issues/verbal cues  FIM - Diplomatic Services operational officer Devices: Elevated toilet seat;Grab bars Toilet Transfers: 5-To toilet/BSC: Supervision (verbal cues/safety issues);5-From toilet/BSC: Supervision (verbal cues/safety issues)  FIM - Bed/Chair Transfer Bed/Chair Transfer Assistive Devices: Bed rails;Arm rests Bed/Chair Transfer: 5: Sit > Supine: Supervision (verbal cues/safety issues);5: Bed > Chair or W/C:  Supervision (verbal cues/safety issues);5: Chair or W/C > Bed: Supervision (verbal cues/safety issues)  FIM - Locomotion: Wheelchair Locomotion: Wheelchair: 5: Travels 150 ft or more: maneuvers on rugs and over door sills with supervision, cueing or coaxing FIM -  Locomotion: Ambulation Locomotion: Ambulation Assistive Devices: Designer, industrial/product Ambulation/Gait Assistance: 4: Min assist Locomotion: Ambulation: 4: Travels 150 ft or more with minimal assistance (Pt.>75%)  Comprehension Comprehension Mode: Auditory Comprehension: 6-Follows complex conversation/direction: With extra time/assistive device  Expression Expression Mode: Verbal Expression: 7-Expresses complex ideas: With no assist  Social Interaction Social Interaction: 6-Interacts appropriately with others with medication or extra time (anti-anxiety, antidepressant).  Problem Solving Problem Solving: 6-Solves complex problems: With extra time  Memory Memory: 6-More than reasonable amt of time  Medical Problem List and Plan:  1. DVT Prophylaxis/Anticoagulation: Pharmaceutical: Lovenox  2. Pain Management: continue mobic for acute on chronic back pain.  3. Mood: Continue zoloft for history of depression. Trazodone has been helpful for sleep  -team providing positive reinforcement 4. Neuropsych: This patient is capable of making decisions on his/her own behalf.  5. GU: E coli UTI--day 3 cipro. Sx better  6. Spasticity: continue baclofen.  7. FEN:   MVI with Fe per pt request.--she uses Fe for RLS  LOS (Days) 5 A FACE TO FACE EVALUATION WAS PERFORMED  SWARTZ,ZACHARY T 03/06/2013 8:36 AM

## 2013-03-06 NOTE — Progress Notes (Signed)
Occupational Therapy Note  Patient Details  Name: Gail Gilbert MRN: [id number] Date of Birth: [birth year] Today's Date: 03/06/2013  Time:  1500-1740  (40 min) Pain:  None Individual session  Addressed functional mobility, dynamic balance.  Pt. Ambulated with RW to gym plus minimal contact guard assist.  Engaged in Wii tennis without walker.  Addressed stepping forward and backward with each foot during game.  Pt. had 2 LOB during stepping movements.  Pt needed min assist for keeping balance throughout activity.  Pt. Stood for 40 minutes during session.  Ambulated back to room with RW and close supervision to occasional minimal assist.     Humberto Seals 03/06/2013, 6:08 PM

## 2013-03-07 ENCOUNTER — Inpatient Hospital Stay (HOSPITAL_COMMUNITY): Payer: Medicaid Other

## 2013-03-07 ENCOUNTER — Inpatient Hospital Stay (HOSPITAL_COMMUNITY): Payer: Medicaid Other | Admitting: Occupational Therapy

## 2013-03-07 NOTE — Progress Notes (Signed)
Subjective/Complaints: No new complaints. Slept well again. Moved bowels. A 12 point review of systems has been performed and if not noted above is otherwise negative.   Objective: Vital Signs: Blood pressure 99/63, pulse 78, temperature 98.3 F (36.8 C), temperature source Oral, resp. rate 19, weight 62.551 kg (137 lb 14.4 oz), SpO2 96.00%. No results found. No results found for this basename: WBC, HGB, HCT, PLT,  in the last 72 hours No results found for this basename: NA, K, CL, CO, GLUCOSE, BUN, CREATININE, CALCIUM,  in the last 72 hours CBG (last 3)  No results found for this basename: GLUCAP,  in the last 72 hours  Wt Readings from Last 3 Encounters:  03/02/13 62.551 kg (137 lb 14.4 oz)  02/23/13 61.689 kg (136 lb)  08/05/12 61.689 kg (136 lb)    Physical Exam:  .Constitutional: She is oriented to person, place, and time. She appears well-developed and well-nourished.  HENT:  Head: Normocephalic and atraumatic.  Eyes: Pupils are equal, round, and reactive to light.  Neck: Normal range of motion. Neck supple.  Cardiovascular: Normal rate and regular rhythm.  Pulmonary/Chest: Effort normal and breath sounds normal.  Abdominal: Soft. Bowel sounds are normal. She exhibits no distension. There is no tenderness.  Musculoskeletal: She exhibits no edema and no tenderness.  Neurological: She is alert and oriented to person, place, and time.  BLE with improved coordination. Still with impaired sensation RLE>LLE. Impaired proprioception. Impaired sensation BLE below trunk. Mild sensory loss in hands to forearms. UE strength grossly 5/5 except for HI which were  4+/5. LE 4/5 proximal to distal. Walks with steppage gait pattern, recurvatum with stance. Skin: Skin is warm and dry.  Psychiatric: She has a normal mood and affect. Her behavior is slightly anxious.  Judgment and thought content normal.    Assessment/Plan: 1. Functional deficits secondary to cervico-thoracic transverse  myelitis which require 3+ hours per day of interdisciplinary therapy in a comprehensive inpatient rehab setting. Physiatrist is providing close team supervision and 24 hour management of active medical problems listed below. Physiatrist and rehab team continue to assess barriers to discharge/monitor patient progress toward functional and medical goals. FIM: FIM - Bathing Bathing Steps Patient Completed: Chest;Right Arm;Left Arm;Abdomen;Front perineal area;Buttocks;Right upper leg;Left upper leg;Right lower leg (including foot);Left lower leg (including foot) Bathing: 4: Steadying assist  FIM - Upper Body Dressing/Undressing Upper body dressing/undressing steps patient completed: Thread/unthread right sleeve of pullover shirt/dresss;Thread/unthread left sleeve of pullover shirt/dress;Put head through opening of pull over shirt/dress;Pull shirt over trunk Upper body dressing/undressing: 5: Supervision: Safety issues/verbal cues FIM - Lower Body Dressing/Undressing Lower body dressing/undressing steps patient completed: Thread/unthread right pants leg;Thread/unthread left pants leg;Pull underwear up/down;Thread/unthread left underwear leg;Thread/unthread right underwear leg;Pull pants up/down Lower body dressing/undressing: 5: Supervision: Safety issues/verbal cues  FIM - Toileting Toileting steps completed by patient: Adjust clothing prior to toileting;Performs perineal hygiene;Adjust clothing after toileting Toileting Assistive Devices: Grab bar or rail for support Toileting: 5: Supervision: Safety issues/verbal cues  FIM - Diplomatic Services operational officer Devices: Elevated toilet seat;Grab bars Toilet Transfers: 5-To toilet/BSC: Supervision (verbal cues/safety issues);5-From toilet/BSC: Supervision (verbal cues/safety issues)  FIM - Bed/Chair Transfer Bed/Chair Transfer Assistive Devices: Bed rails;Arm rests Bed/Chair Transfer: 5: Sit > Supine: Supervision (verbal cues/safety  issues);5: Bed > Chair or W/C: Supervision (verbal cues/safety issues);5: Chair or W/C > Bed: Supervision (verbal cues/safety issues)  FIM - Locomotion: Wheelchair Locomotion: Wheelchair: 5: Travels 150 ft or more: maneuvers on rugs and over door sills with supervision, cueing  or coaxing FIM - Locomotion: Ambulation Locomotion: Ambulation Assistive Devices: Designer, industrial/product Ambulation/Gait Assistance: 4: Min assist Locomotion: Ambulation: 4: Travels 150 ft or more with minimal assistance (Pt.>75%)  Comprehension Comprehension Mode: Auditory Comprehension: 6-Follows complex conversation/direction: With extra time/assistive device  Expression Expression Mode: Verbal Expression: 7-Expresses complex ideas: With no assist  Social Interaction Social Interaction: 6-Interacts appropriately with others with medication or extra time (anti-anxiety, antidepressant).  Problem Solving Problem Solving: 6-Solves complex problems: With extra time  Memory Memory: 6-More than reasonable amt of time  Medical Problem List and Plan:  1. DVT Prophylaxis/Anticoagulation: Pharmaceutical: Lovenox  2. Pain Management: continue mobic for acute on chronic back pain.  3. Mood: Continue zoloft for history of depression. Trazodone has been helpful for sleep  -team providing positive reinforcement 4. Neuropsych: This patient is capable of making decisions on his/her own behalf.  5. GU: E coli UTI--7 day course of cipro  6. Spasticity: continue baclofen.  7. FEN:   MVI with Fe per pt request.--she uses Fe for RLS  LOS (Days) 6 A FACE TO FACE EVALUATION WAS PERFORMED  Chanetta Moosman T 03/07/2013 8:04 AM

## 2013-03-07 NOTE — Progress Notes (Signed)
Physical Therapy Session Note  Patient Details  Name: Gail Gilbert MRN: [id number] Date of Birth: 05-22-1951  Today's Date: 03/07/2013 Time: 1130-1200 Time Calculation (min): 30 min  Short Term Goals: Week 1:  PT Short Term Goal 1 (Week 1): Pt will demonstrate basic transfers with S PT Short Term Goal 2 (Week 1): Pt will be able to demonstrate dynamic standing balance without UE support with steady A PT Short Term Goal 3 (Week 1): Pt will gait > 50' with min A PT Short Term Goal 4 (Week 1): Pt will propel w/c mod I on unit  Skilled Therapeutic Interventions/Progress Updates:    Gait to/from therapy gym with focus on posture, gait pattern, cadence, and LE control with close S/steady A intermittently. Neuro re-ed for on Kinetron in standing for weightshifting and balance activities against resistance of 30 in 1-2 min intervals with seated rest breaks as needed.   Therapy Documentation Precautions:  Precautions Precautions: Fall Restrictions Weight Bearing Restrictions: No   Pain: Pain Assessment Pain Score: 0-No pain Locomotion : Ambulation Ambulation/Gait Assistance: 4: Min guard   See FIM for current functional status  Therapy/Group: Individual Therapy  Karolee Stamps [facility] 03/07/2013, 12:17 PM

## 2013-03-07 NOTE — Progress Notes (Signed)
Physical Therapy Session Note  Patient Details  Name: Gail Gilbert MRN: [id number] Date of Birth: 1951/05/17  Today's Date: 03/07/2013 Time: 0828-0928 Time Calculation (min): 60 min  Short Term Goals: Week 1:  PT Short Term Goal 1 (Week 1): Pt will demonstrate basic transfers with S PT Short Term Goal 2 (Week 1): Pt will be able to demonstrate dynamic standing balance without UE support with steady A PT Short Term Goal 3 (Week 1): Pt will gait > 50' with min A PT Short Term Goal 4 (Week 1): Pt will propel w/c mod I on unit  Skilled Therapeutic Interventions/Progress Updates:    w/c propulsion down to therapy gym for endurance and UE strengthening mod I. Focused on neuro re-ed for balance using Wii Fit (Tilt Table, Ski Jump, and Snowboard) to work on weightshifting, squats, and overall balance strategies without UE support (steady to min A). Stair training for neuro re-ed to LE's including coordination and balance using only 1 rail (close S to steady A with bilateral rails) and step to gait pattern with min A; decreased control in L. Practiced floor transfers and what to do in case of emergency with min A overall x 3 reps. Strengthening and balance in quadruped for hip extension and abduction; attempted opposite arm/leg raise but unable to maintain balance x 5 reps each side x 2 sets each. Gait with RW with close S/steady A back to pt room with cues for hip stabilization and step length/BOS.  Therapy Documentation Precautions:  Precautions Precautions: Fall Restrictions Weight Bearing Restrictions: No  Pain: No complaints.   Locomotion : Ambulation Ambulation/Gait Assistance: 4: Min guard   See FIM for current functional status  Therapy/Group: Individual Therapy  Karolee Stamps [facility] 03/07/2013, 9:29 AM

## 2013-03-07 NOTE — Progress Notes (Signed)
Occupational Therapy Session Notes  Patient Details  Name: Gail Gilbert MRN: [id number] Date of Birth: Mar 17, 1951  Today's Date: 03/07/2013  Short Term Goals: Week 1:  OT Short Term Goal 1 (Week 1): Short Term Goals = Long Term Goals  Skilled Therapeutic Interventions/Progress Updates:   Session #1 1005-1100 - 55 Minutes Individual Therapy No complaints of pain Patient found seated in w/c and stated she already cleaned up and dressed this am. Patient propelled self -> ADL apartment and engaged in simple meal prep ambulating with and without rolling walker; when patient was not using rolling walker she was using counter for UE support. Patient made Jell-O in kitchen taking one seated rest break, patient also worked in Surveyor, mining on taking dishes/items out of dishwasher and putting them in their location within the kitchen. Patient also engaged in furniture transfer on/off couch. Focused skilled intervention on functional mobility using rolling walker, safety with rolling walker, sit<>stands, overall activity tolerance/endurance, and dynamic standing balance/tolerance/endurance. Patient ambulated back to room pushing w/c, then ambulated into w/c room with rolling walker to get a walker bag for her rolling walker; for safety. Therapist administered red theraputty and educated patient on its usage. At end of session left patient seated edge of bed with call bell & phone within reach.   Session #2 [id number] - 45 Minutes Individual Therapy No complaints of pain Patient found seated edge of bed. Patient engaged in functional ambulation -> bathroom for toilet transfer on/off elevated toilet seat and toileting. Patient then ambulated from room -> therapy gym with close supervision using rolling walker. In therapy gym, focused on bilateral UE strengthening exercises using pushup blocks and 4lb dumb bells. Also, focused on overall core/trunk strengthening while patient balancing on therapy ball and doing UE  strengthening exercises. Patient motivated to continue bike riding in the near future, used therapy ball as a start for simulation of sitting on bike. Patient ambulated back to room using rolling walker and close supervision, therapist left patient seated edge of bed with call bell & phone within reach.   Precautions:  Precautions Precautions: Fall Restrictions Weight Bearing Restrictions: No  See FIM for current functional status  Clemente Dewey 03/07/2013, 7:40 AM

## 2013-03-08 ENCOUNTER — Inpatient Hospital Stay (HOSPITAL_COMMUNITY): Payer: Medicaid Other

## 2013-03-08 ENCOUNTER — Inpatient Hospital Stay (HOSPITAL_COMMUNITY): Payer: Medicaid Other | Admitting: Physical Therapy

## 2013-03-08 ENCOUNTER — Inpatient Hospital Stay (HOSPITAL_COMMUNITY): Payer: Medicaid Other | Admitting: Occupational Therapy

## 2013-03-08 ENCOUNTER — Inpatient Hospital Stay (HOSPITAL_COMMUNITY): Payer: Medicaid Other | Admitting: *Deleted

## 2013-03-08 LAB — CREATININE, SERUM
Creatinine, Ser: 0.75 mg/dL (ref 0.50–1.10)
GFR calc Af Amer: >90 mL/min (ref >90)
GFR calc non Af Amer: 90 mL/min — ABNORMAL LOW (ref >90)

## 2013-03-08 MED ORDER — INFLUENZA VIRUS VACC SPLIT PF IM SUSP
0.5000 mL | INTRAMUSCULAR | Status: AC
  Filled 2013-03-08: qty 0.5

## 2013-03-08 NOTE — Progress Notes (Signed)
Physical Therapy Discharge Summary  Patient Details  Name: Gail Gilbert MRN: [id number] Date of Birth: 14-Apr-1951  Today's Date: 03/09/2013  Time: 830-915 (45 min) Individual therapy; No complaints of pain. Donned L ankle brace for support due to noted instability. Gait with Rw on unit with S for safety/balance to and from therapy. Practiced floor transfer and reviewed what to do in case of fall and pt able to complete with close S using mat (to simulate furniture) for support. Neuro re-ed and balance training on Wii Fit balance board (ski jump) without UE support requiring S to min A (1 LOB with min A to recover) and Wii Tennis without UE support. Stair training for LE coordination/control with S using bilateral rails. Attempted 2 steps without UE support and required heavy min to light mod A for balance. W/c mobility mod I and pt made mod I in room for w/c level. Discussed recommendation for S still for gait and pt in agreement.   Patient has met 9 of 9 long term goals due to improved activity tolerance, improved balance, improved postural control, increased strength, decreased pain, ability to compensate for deficits, functional use of  right lower extremity and left lower extremity and improved coordination.  Patient to discharge at a wheelchair level Modified Independent and close S for gait with RW.  Reasons goals not met: n/a  Recommendation:  Patient will benefit from ongoing skilled PT services in outpatient setting to continue to advance safe functional mobility, address ongoing impairments in gait, balance, proprioception, endurance, strength, and minimize fall risk.  Equipment: [phone] w/c with basic cushion; Pt already owns RW.  Reasons for discharge: treatment goals met and discharge from hospital  Patient/family agrees with progress made and goals achieved: Yes  PT Discharge Precautions/Restrictions Precautions Precautions: Fall Vision/Perception  Vision - History Baseline  Vision: Wears glasses all the time Patient Visual Report: No change from baseline Vision - Assessment Eye Alignment: Within Functional Limits Perception Perception: Within Functional Limits Praxis Praxis: Intact  Cognition Overall Cognitive Status: Appears within functional limits for tasks assessed Memory: Appears intact Awareness: Appears intact Problem Solving: Appears intact Safety/Judgment: Appears intact Sensation Sensation Light Touch: Impaired Detail Light Touch Impaired Details: Impaired RLE;Impaired LLE (improved per pt report since admission) Proprioception: Impaired Detail Proprioception Impaired Details: Impaired RLE;Impaired LLE Coordination Gross Motor Movements are Fluid and Coordinated: No Motor  Motor Motor: Ataxia;Motor impersistence   Locomotion  Ambulation Ambulation/Gait Assistance: 5: Supervision  Trunk/Postural Assessment  Cervical Assessment Cervical Assessment: Within Functional Limits Thoracic Assessment Thoracic Assessment: Within Functional Limits Lumbar Assessment Lumbar Assessment: Within Functional Limits  Balance Balance Balance Assessed: Yes Static Sitting Balance Static Sitting - Level of Assistance: 7: Independent Dynamic Sitting Balance Dynamic Sitting - Level of Assistance: 7: Independent Static Standing Balance Static Standing - Level of Assistance: 6: Modified independent (Device/Increase time) Dynamic Standing Balance Dynamic Standing - Level of Assistance: 6: Modified independent (Device/Increase time) (with RW/UE support) Extremity Assessment  RUE Assessment RUE Assessment: Within Functional Limits LUE Assessment LUE Assessment: Within Functional Limits RLE Assessment RLE Assessment: Exceptions to [facility] RLE Strength RLE Overall Strength Comments: weaker proximally (hip abduction/glutes); knee and ankle 4/5 LLE Assessment LLE Assessment: Exceptions to [facility] LLE Strength LLE Overall Strength Comments: weaker proximally  (hip abduction/glutes); knee and ankle 4/5; decreased coordination greater on L  See FIM for current functional status  Karolee Stamps [facility] 03/09/2013, 4:07 PM

## 2013-03-08 NOTE — Progress Notes (Signed)
Social Work Patient ID: Gail Gilbert, female   DOB: [birth year], 62 y.o.   MRN: [id number]  Met with patient this afternoon to review team conference.  Pt aware and agreeable with targeted d/c of 3/27 with mod i to supervision goals.  Pt also agreeable with recommendation for OPPT - will refer to [facility].  No concerns at this time except around financial issues (no insurance but does have Medicaid and SSD applications pending).  Have explained process of applying for financial hardship assist should her MA and SSD apps not get approved.  Will continue to follow.  Conrado Nance, LCSW

## 2013-03-08 NOTE — Patient Care Conference (Signed)
Inpatient RehabilitationTeam Conference and Plan of Care Update Date: 03/08/2013   Time: 2:20 PM    Patient Name: Gail Gilbert      Medical Record Number: [id number]  Date of Birth: 03/18/1951 Sex: Female         Room/Bed: 4009/4009-01 Payor Info: Payor:     Admitting Diagnosis: Thoracic Transverse Myelitis  Admit Date/Time:  03/01/2013  4:37 PM Admission Comments: No comment available   Primary Diagnosis:  Idiopathic transverse myelitis Principal Problem: Idiopathic transverse myelitis  Patient Active Problem List   Diagnosis Date Noted  . UTI (urinary tract infection) due to E. coli 03/04/2013  . Idiopathic transverse myelitis 03/01/2013  . Urinary retention 02/27/2013  . Myelitis 02/21/2013  . Leg weakness, bilateral 02/20/2013  . Anxiety 08/05/2012  . Spondylolisthesis   . RLS (restless legs syndrome)     Expected Discharge Date: Expected Discharge Date: 03/10/13  Team Members Present: Physician leading conference: Dr. Faith Rogue Nurse Present: Daryll Brod, RN PT Present: Karolee Stamps, PT;Other (comment) Sherrine Maples, PT) OT Present: Edwin Cap, OT Other (Discipline and Name): Tora Duck, PPS Coordinator     Current Status/Progress Goal Weekly Team Focus  Medical   transverse myelitis. proprioceptive deficits, especially on left  bladder and bowel maintenance, improve balance  see above, rx UTI.    Bowel/Bladder   Continent of bowel and bladder. LBM 03/06/13  Continent of bowel and bladder  Monitor   Swallow/Nutrition/ Hydration             ADL's   overall supervision - ambulating, mod I w/c level  overall mod I - w/c level  sit<>stands, dynamic standing balance/tolerance/endurance, overall activity tolerance/endurance, UE strengthening/coordination   Mobility   min A/close S overall  mod I to S overall  neuro re-ed, balance training, gait, endurance, functional strengthening   Communication             Safety/Cognition/ Behavioral  Observations            Pain   Scheduled baclofen 5mg  tid  <3  Monitor for effectiveness   Skin   CDI  CDI  Monitor    Rehab Goals Patient on target to meet rehab goals: Yes *See Interdisciplinary Assessment and Plan and progress notes for long and short-term goals  Barriers to Discharge: balance    Possible Resolutions to Barriers:  improved safety awareness, adaptive techniques and equipment/orthotics    Discharge Planning/Teaching Needs:  home with son and daughter-in-law available to provide 24/7 assistance.      Team Discussion:  Making good gains toward mod i to supervision goals.  Ankle rolling continues to be a problem - to try support AE.  Recommend OPPT for follow up.  No concerns at this point.  Revisions to Treatment Plan:  None   Continued Need for Acute Rehabilitation Level of Care: The patient requires daily medical management by a physician with specialized training in physical medicine and rehabilitation for the following conditions: Daily direction of a multidisciplinary physical rehabilitation program to ensure safe treatment while eliciting the highest outcome that is of practical value to the patient.: Yes Daily medical management of patient stability for increased activity during participation in an intensive rehabilitation regime.: Yes Daily analysis of laboratory values and/or radiology reports with any subsequent need for medication adjustment of medical intervention for : Post surgical problems (UTI. anxiety control)  Caroll Weinheimer 03/08/2013, 4:59 PM

## 2013-03-08 NOTE — Progress Notes (Signed)
Subjective/Complaints: Bowel and bladder working well. Denies any other issues. Slept well. A 12 point review of systems has been performed and if not noted above is otherwise negative.   Objective: Vital Signs: Blood pressure 100/55, pulse 73, temperature 97.9 F (36.6 C), temperature source Oral, resp. rate 19, weight 62.551 kg (137 lb 14.4 oz), SpO2 95.00%. No results found. No results found for this basename: WBC, HGB, HCT, PLT,  in the last 72 hours No results found for this basename: NA, K, CL, CO, GLUCOSE, BUN, CREATININE, CALCIUM,  in the last 72 hours CBG (last 3)  No results found for this basename: GLUCAP,  in the last 72 hours  Wt Readings from Last 3 Encounters:  03/02/13 62.551 kg (137 lb 14.4 oz)  02/23/13 61.689 kg (136 lb)  08/05/12 61.689 kg (136 lb)    Physical Exam:  .Constitutional: She is oriented to person, place, and time. She appears well-developed and well-nourished.  HENT:  Head: Normocephalic and atraumatic.  Eyes: Pupils are equal, round, and reactive to light.  Neck: Normal range of motion. Neck supple.  Cardiovascular: Normal rate and regular rhythm.  Pulmonary/Chest: Effort normal and breath sounds normal.  Abdominal: Soft. Bowel sounds are normal. She exhibits no distension. There is no tenderness.  Musculoskeletal: She exhibits no edema and no tenderness.  Neurological: She is alert and oriented to person, place, and time.  BLE with improved coordination. Still with impaired sensation RLE>LLE. Impaired proprioception. Impaired sensation BLE below trunk. Mild sensory loss in hands to forearms. UE strength grossly 5/5 except for HI which were  4+/5. LE 4/5 proximal to distal. Walks with slight steppage gait pattern, recurvatum with stance. Skin: Skin is warm and dry.  Psychiatric: She has a normal mood and affect. Her behavior is slightly anxious.  Judgment and thought content normal.    Assessment/Plan: 1. Functional deficits secondary to  cervico-thoracic transverse myelitis which require 3+ hours per day of interdisciplinary therapy in a comprehensive inpatient rehab setting. Physiatrist is providing close team supervision and 24 hour management of active medical problems listed below. Physiatrist and rehab team continue to assess barriers to discharge/monitor patient progress toward functional and medical goals. FIM: FIM - Bathing Bathing Steps Patient Completed: Chest;Right Arm;Left Arm;Abdomen;Front perineal area;Buttocks;Right upper leg;Left upper leg;Right lower leg (including foot);Left lower leg (including foot) Bathing: 4: Steadying assist  FIM - Upper Body Dressing/Undressing Upper body dressing/undressing steps patient completed: Thread/unthread right sleeve of pullover shirt/dresss;Thread/unthread left sleeve of pullover shirt/dress;Put head through opening of pull over shirt/dress;Pull shirt over trunk Upper body dressing/undressing: 5: Supervision: Safety issues/verbal cues FIM - Lower Body Dressing/Undressing Lower body dressing/undressing steps patient completed: Thread/unthread right pants leg;Thread/unthread left pants leg;Pull underwear up/down;Thread/unthread left underwear leg;Thread/unthread right underwear leg;Pull pants up/down Lower body dressing/undressing: 5: Supervision: Safety issues/verbal cues  FIM - Toileting Toileting steps completed by patient: Adjust clothing prior to toileting;Performs perineal hygiene;Adjust clothing after toileting Toileting Assistive Devices: Grab bar or rail for support Toileting: 5: Supervision: Safety issues/verbal cues  FIM - Diplomatic Services operational officer Devices: Elevated toilet seat;Grab bars Toilet Transfers: 5-To toilet/BSC: Supervision (verbal cues/safety issues);5-From toilet/BSC: Supervision (verbal cues/safety issues)  FIM - Banker Devices: Therapist, occupational: 6: Supine > Sit: No assist;6: Sit >  Supine: No assist;5: Bed > Chair or W/C: Supervision (verbal cues/safety issues);5: Chair or W/C > Bed: Supervision (verbal cues/safety issues)  FIM - Locomotion: Wheelchair Locomotion: Wheelchair: 5: Travels 150 ft or more: maneuvers on rugs and over  door sills with supervision, cueing or coaxing FIM - Locomotion: Ambulation Locomotion: Ambulation Assistive Devices: Walker - Rolling Ambulation/Gait Assistance: 4: Min guard Locomotion: Ambulation: 4: Travels 150 ft or more with minimal assistance (Pt.>75%)  Comprehension Comprehension Mode: Auditory Comprehension: 6-Follows complex conversation/direction: With extra time/assistive device  Expression Expression Mode: Verbal Expression: 7-Expresses complex ideas: With no assist  Social Interaction Social Interaction: 6-Interacts appropriately with others with medication or extra time (anti-anxiety, antidepressant).  Problem Solving Problem Solving: 6-Solves complex problems: With extra time  Memory Memory: 6-More than reasonable amt of time  Medical Problem List and Plan:  1. DVT Prophylaxis/Anticoagulation: Pharmaceutical: Lovenox  2. Pain Management: continue mobic for acute on chronic back pain.  3. Mood: Continue zoloft for history of depression. Trazodone has been helpful for sleep  -team providing positive reinforcement 4. Neuropsych: This patient is capable of making decisions on his/her own behalf.  5. GU: E coli UTI--7 day course of cipro -bladder function improved 6. Spasticity: continue baclofen.  7. FEN:   MVI with Fe per pt request.--she uses Fe for RLS  LOS (Days) 7 A FACE TO FACE EVALUATION WAS PERFORMED  Zaylynn Rickett T 03/08/2013 7:35 AM

## 2013-03-08 NOTE — Progress Notes (Addendum)
Physical Therapy Session Note  Patient Details  Name: Gail Gilbert MRN: [id number] Date of Birth: 09-19-1951  Today's Date: 03/08/2013 Time: 1300-1355 Time Calculation (min): 55 min  Short Term Goals: Week 1:  PT Short Term Goal 1 (Week 1): Pt will demonstrate basic transfers with S PT Short Term Goal 2 (Week 1): Pt will be able to demonstrate dynamic standing balance without UE support with steady A PT Short Term Goal 3 (Week 1): Pt will gait > 50' with min A PT Short Term Goal 4 (Week 1): Pt will propel w/c mod I on unit  Skilled Therapeutic Interventions/Progress Updates:    Pt participated in gait group with emphasis on dynamic gait with RW, endurance and strengthening, and dynamic standing balance activities with obstacle course negotiation, bowling activity as well as picking up pins to set up for other patient's participating, and gait on carpeted and tiled surfaces for endurance and strengthening. LE seated therex during rest breaks including LAQ, marches, and sit to stands. Gait at end of session without AD with min/mod A. Left ankle instability noted still during gait/mobility activities.   Therapy Documentation Precautions:  Precautions Precautions: Fall Restrictions Weight Bearing Restrictions: No  Pain:  No complaints.   Locomotion : Ambulation Ambulation/Gait Assistance: 4: Min guard   See FIM for current functional status  Therapy/Group: Co-Treatment and Group Therapy with Recreational Therapy  Karolee Stamps [facility] 03/08/2013, 2:09 PM

## 2013-03-08 NOTE — Progress Notes (Signed)
Physical Therapy Session Note  Patient Details  Name: Gail Gilbert MRN: [id number] Date of Birth: 19-Aug-1951  Today's Date: 03/08/2013 Time: 4098-1191 Time Calculation (min): 44 min  Short Term Goals: Week 1:  PT Short Term Goal 1 (Week 1): Pt will demonstrate basic transfers with S PT Short Term Goal 2 (Week 1): Pt will be able to demonstrate dynamic standing balance without UE support with steady A PT Short Term Goal 3 (Week 1): Pt will gait > 50' with min A PT Short Term Goal 4 (Week 1): Pt will propel w/c mod I on unit  Skilled Therapeutic Interventions/Progress Updates:    Bathroom mobility with RW with supervision. Corner balance exercises with varied bases of support (normal, narrow, modified tandem) + horizontal and vertical head movements for difficulty. Pt needed min assist for balance however would likely benefit from corner balance exercises for a HEP.  Ambulation to/from gym with RW and supervision/min-guard assist and cues for pelvic stability. No significant loss of balance with RW. Pelvic stability and balance activities in quadruped (lifting alternating legs then opposite arm and leg, min assist); tall kneeling; half kneeling. Pt found half kneeling most difficult. Ambulation practiced without RW for carryover of balance and pelvic stability, overall min assist needed for gait instability. PT facilitation of Rt. Hip abductors.   Pt feels her Rt. Leg is shorter than Lt, will discuss with primary PT but not addressed this session.  Therapy Documentation Precautions:  Precautions Precautions: Fall Restrictions Weight Bearing Restrictions: No Pain:   no c/o  See FIM for current functional status  Therapy/Group: Individual Therapy  Wilhemina Bonito 03/08/2013, 12:27 PM

## 2013-03-08 NOTE — Progress Notes (Signed)
Physical Therapy Session Note  Patient Details  Name: Gail Gilbert MRN: [id number] Date of Birth: [birth year]  Today's Date: 03/08/2013 Time: 0830-0900 Time Calculation (min): 30 min  Short Term Goals: Week 1:  PT Short Term Goal 1 (Week 1): Pt will demonstrate basic transfers with S PT Short Term Goal 2 (Week 1): Pt will be able to demonstrate dynamic standing balance without UE support with steady A PT Short Term Goal 3 (Week 1): Pt will gait > 50' with min A PT Short Term Goal 4 (Week 1): Pt will propel w/c mod I on unit  Skilled Therapeutic Interventions/Progress Updates:    Gait training on unit with close S (2 episodes of LOB requiring steady A to correct) with RW; emphasis on gait pattern, heel strike, and posture. Previous therapist mentioned pt concerns of leg length discrepancy; measured for this but not discrepancy identified. ASIS and sacrum level as well with assessment. Determined pt with trendelenberg gait due to weakness and attribute this to pt feeling like her leg is shorter or "hip is popping out to the side." Standing hip abduction to move Bosu ball and with 4# weight on it to isolate hip abductors and strengthen to carry over to functional gait.   Therapy Documentation Precautions:  Precautions Precautions: Fall Restrictions Weight Bearing Restrictions: No  Pain: No complaints.  See FIM for current functional status  Therapy/Group: Individual Therapy  Karolee Stamps [facility] 03/08/2013, 9:00 AM

## 2013-03-08 NOTE — Progress Notes (Signed)
Recreational Therapy Session Note  Patient Details  Name: Gail Gilbert MRN: [id number] Date of Birth: 08-06-1951 Today's Date: 03/08/2013 Time:  1255-1400 Pain: no c/o Skilled Therapeutic Interventions/Progress Updates: Session focused on ambulation with & without RW on tile & carpeted surfaces, activity tolerance, dynamic standing balance, & obstacle negotiation.  Pt participated in bowling activity standing with 1 UE support on RW with contact guard assist.  Pt also picked up pins and reset them for the other players with min assist.  Pt ambulated without RW from Dayroom to her room with min-mod assist, L ankle instability noted during ambulation & dynamic balance activities & discussed with PT.  Therapy/Group: Co-Treatment [name] 03/08/2013, 4:49 PM

## 2013-03-08 NOTE — Progress Notes (Signed)
Orthopedic Tech Progress Note Patient Details:  [name] 1951-09-16 [id number] [facility] called to place brace order. Spoke with Amil Amen. Patient ID: Gail Gilbert, female   DOB: 1951-01-30, 62 y.o.   MRN: [id number]   Orie Rout 03/08/2013, 2:44 PM

## 2013-03-08 NOTE — Progress Notes (Signed)
Occupational Therapy Session Note  Patient Details  Name: Gail Gilbert MRN: [id number] Date of Birth: 03/21/1951  Today's Date: 03/08/2013 Time: 1100-1155 Time Calculation (min): 55 min  Short Term Goals: Week 1:  OT Short Term Goal 1 (Week 1): Short Term Goals = Long Term Goals  Skilled Therapeutic Interventions/Progress Updates:  Patient found seated in w/c. Patient gathered all clothes at w/c level (mod I level), patient then ambulated from room -> ADL apartment using rolling walker. Practiced simulated tub/shower transfer stepping in/out tub/shower, patient plans to purchase a shower seat from [employer] as opposed to ordering shower seat through advanced. Patient ambulated back to room for shower stall transfer and UB/LB bathing at shower level. Then patient performed UB/LB dressing in sit<>stand position from w/c level. Left patient seated in w/c to complete grooming tasks. Patient able to maneuver around room prn.   Precautions:  Precautions Precautions: Fall Restrictions Weight Bearing Restrictions: No  See FIM for current functional status  Therapy/Group: Individual Therapy  Terrika Zuver 03/08/2013, 12:02 PM

## 2013-03-09 ENCOUNTER — Inpatient Hospital Stay (HOSPITAL_COMMUNITY): Payer: Medicaid Other

## 2013-03-09 ENCOUNTER — Inpatient Hospital Stay (HOSPITAL_COMMUNITY): Payer: Medicaid Other | Admitting: Occupational Therapy

## 2013-03-09 ENCOUNTER — Inpatient Hospital Stay (HOSPITAL_COMMUNITY): Payer: Medicaid Other | Admitting: Physical Therapy

## 2013-03-09 ENCOUNTER — Encounter (HOSPITAL_COMMUNITY): Payer: Self-pay | Admitting: *Deleted

## 2013-03-09 NOTE — Progress Notes (Signed)
Occupational Therapy Session Note & Discharge Summary  Patient Details  Name: Gail Gilbert MRN: [id number] Date of Birth: [birth year]  Today's Date: 03/09/2013  SESSION NOTE 1105-1200 - 55 Minutes Individual Therapy No complaints of pain Patient found in bathroom (patient mod I in room for w/c transfers). Patient changed clothes then transferred back to w/c at mod I level. Patient refused shower. Patient and therapist then discussed shower transfers for at home. Patient propelled self from room -> ADL apartment for simulated walk-in shower transfer using blue simulated block and shower seat with back. Patient then engaged in UE therapeutic exercise focusing on bilateral shoulders, biceps, and triceps using dumbbells. UE exercises performed while seated on therapy ball working on core/trunk control. Patient then ambulated back to room, pushing w/c. Therapist wrote down UE exercises for a HEP. Patient left seated edge of bed, patient able to maneuver around room prn from a w/c level.  ---------------------------------------------------------------------------------------------------------  DISCHARGE SUMMARY Patient has met 12 of 12 long term goals due to improved activity tolerance, improved balance, postural control, ability to compensate for deficits, functional use of  RIGHT upper, RIGHT lower, LEFT upper and LEFT lower extremity, improved attention, improved awareness and improved coordination.  Patient to discharge at overall Modified Independent level (w/c leve), supervision (ambulating level).    Reasons goals not met: n/a, all goals met at this time.  Recommendation: No additional occupational therapy recommended at this time.  Equipment: shower seat with back and BSC  Reasons for discharge: treatment goals met and discharge from hospital  Patient/family agrees with progress made and goals achieved: Yes  Precautions/Restrictions  Precautions Precautions: Fall Restrictions Weight  Bearing Restrictions: No  ADL - See FIM  Vision/Perception  Vision - History Baseline Vision: Wears glasses all the time Patient Visual Report: No change from baseline Vision - Assessment Eye Alignment: Within Functional Limits Perception Perception: Within Functional Limits Praxis Praxis: Intact   Cognition Overall Cognitive Status: Appears within functional limits for tasks assessed Arousal/Alertness: Awake/alert Orientation Level: Oriented X4 Memory: Appears intact Awareness: Appears intact Problem Solving: Appears intact Safety/Judgment: Appears intact  Sensation Sensation Additional Comments: Bilateral UEs appear intact Coordination Gross Motor Movements are Fluid and Coordinated: Yes (BUEs) Fine Motor Movements are Fluid and Coordinated: Yes (BUEs)  Motor - See Discharge Navigator  Mobility - See Discharge Navigator  Trunk/Postural Assessment - See Discharge Navigator  Balance- See Discharge Navigator  Extremity/Trunk Assessment RUE Assessment RUE Assessment: Within Functional Limits LUE Assessment LUE Assessment: Within Functional Limits  See FIM for current functional status  Rosbel Buckner 03/09/2013, 12:39 PM

## 2013-03-09 NOTE — Progress Notes (Signed)
Recreational Therapy Session Note  Patient Details  Name: Gail Gilbert MRN: [id number] Date of Birth: 1951/10/16 Today's Date: 03/09/2013 Time:1300-1500 Pain: no c/o Skilled Therapeutic Interventions/Progress Updates: Pt participated in community reintegration/outing to [employer] today ambulatory level using RW or shopping cart throughout store ~60 minutes without seated rest break.  Outing focused on identification & negotiation of obstacles, accessing public restrooms, and energy conservation techniques.  See outing goal sheet in shadow chart for futher details.  Therapy/Group: [employer]   Blas Riches 03/09/2013, 5:02 PM

## 2013-03-09 NOTE — Progress Notes (Signed)
Subjective/Complaints: No problems. Received ASO. A 12 point review of systems has been performed and if not noted above is otherwise negative.   Objective: Vital Signs: Blood pressure 99/64, pulse 68, temperature 98.2 F (36.8 C), temperature source Oral, resp. rate 20, weight 62.551 kg (137 lb 14.4 oz), SpO2 94.00%. No results found. No results found for this basename: WBC, HGB, HCT, PLT,  in the last 72 hours  Recent Labs  03/08/13 0615  CREATININE 0.75   CBG (last 3)  No results found for this basename: GLUCAP,  in the last 72 hours  Wt Readings from Last 3 Encounters:  03/02/13 62.551 kg (137 lb 14.4 oz)  02/23/13 61.689 kg (136 lb)  08/05/12 61.689 kg (136 lb)    Physical Exam:  .Constitutional: She is oriented to person, place, and time. She appears well-developed and well-nourished.  HENT:  Head: Normocephalic and atraumatic.  Eyes: Pupils are equal, round, and reactive to light.  Neck: Normal range of motion. Neck supple.  Cardiovascular: Normal rate and regular rhythm.  Pulmonary/Chest: Effort normal and breath sounds normal.  Abdominal: Soft. Bowel sounds are normal. She exhibits no distension. There is no tenderness.  Musculoskeletal: She exhibits no edema and no tenderness.  Neurological: She is alert and oriented to person, place, and time.  BLE with improved coordination. Still with impaired sensation RLE>LLE. Impaired proprioception. Impaired sensation BLE below trunk. Mild sensory loss in hands to forearms. UE strength grossly 5/5 except for HI which were  4+/5. LE 4/5 proximal to distal. Walks with slight steppage gait pattern, recurvatum with stance. Skin: Skin is warm and dry.  Psychiatric: She has a normal mood and affect. Her behavior is slightly anxious.  Judgment and thought content normal.    Assessment/Plan: 1. Functional deficits secondary to cervico-thoracic transverse myelitis which require 3+ hours per day of interdisciplinary therapy in a  comprehensive inpatient rehab setting. Physiatrist is providing close team supervision and 24 hour management of active medical problems listed below. Physiatrist and rehab team continue to assess barriers to discharge/monitor patient progress toward functional and medical goals.  Finalize dc planning for tomorrow.  Utilize left  ASO for ankle support when fatigued or for walking longer dx.  FIM: FIM - Bathing Bathing Steps Patient Completed: Chest;Right Arm;Left Arm;Abdomen;Front perineal area;Buttocks;Right upper leg;Left upper leg;Right lower leg (including foot);Left lower leg (including foot) Bathing: 6: Assistive device (Comment)  FIM - Upper Body Dressing/Undressing Upper body dressing/undressing steps patient completed: Thread/unthread right bra strap;Thread/unthread left bra strap;Hook/unhook bra;Thread/unthread right sleeve of pullover shirt/dresss;Thread/unthread left sleeve of pullover shirt/dress;Put head through opening of pull over shirt/dress;Pull shirt over trunk Upper body dressing/undressing: 7: Complete Independence: No helper FIM - Lower Body Dressing/Undressing Lower body dressing/undressing steps patient completed: Thread/unthread right underwear leg;Thread/unthread left underwear leg;Pull underwear up/down;Thread/unthread right pants leg;Thread/unthread left pants leg;Pull pants up/down;Don/Doff right sock;Don/Doff left sock;Don/Doff right shoe;Don/Doff left shoe;Fasten/unfasten right shoe;Fasten/unfasten left shoe Lower body dressing/undressing: 6: Assistive device (Comment)  FIM - Toileting Toileting steps completed by patient: Adjust clothing prior to toileting;Performs perineal hygiene;Adjust clothing after toileting Toileting Assistive Devices: Grab bar or rail for support Toileting: 6: Assistive device: No helper  FIM - Diplomatic Services operational officer Devices: Elevated toilet seat;Grab bars Toilet Transfers: 0-Activity did not occur  FIM -  Banker Devices: Therapist, occupational: 6: Supine > Sit: No assist;6: Sit > Supine: No assist;5: Bed > Chair or W/C: Supervision (verbal cues/safety issues);5: Chair or W/C > Bed: Supervision (verbal cues/safety issues)  FIM - Locomotion: Wheelchair Locomotion: Wheelchair: 6: Travels 150 ft or more, turns around, maneuvers to table, bed or toilet, negotiates 3% grade: maneuvers on rugs and over door sills independently FIM - Locomotion: Ambulation Locomotion: Ambulation Assistive Devices: Designer, industrial/product Ambulation/Gait Assistance: 4: Min guard Locomotion: Ambulation: 4: Travels 150 ft or more with minimal assistance (Pt.>75%)  Comprehension Comprehension Mode: Auditory Comprehension: 7-Follows complex conversation/direction: With no assist  Expression Expression Mode: Verbal Expression: 7-Expresses complex ideas: With no assist  Social Interaction Social Interaction: 7-Interacts appropriately with others - No medications needed.  Problem Solving Problem Solving: 7-Solves complex problems: Recognizes & self-corrects  Memory Memory: 7-Complete Independence: No helper  Medical Problem List and Plan:  1. DVT Prophylaxis/Anticoagulation: Pharmaceutical: Lovenox  2. Pain Management: continue mobic for acute on chronic back pain.  3. Mood: Continue zoloft for history of depression. Trazodone has been helpful for sleep  -team providing positive reinforcement  -mood up beat and positive 4. Neuropsych: This patient is capable of making decisions on his/her own behalf.  5. GU: E coli UTI--7 day course of cipro -bladder function improved 6. Spasticity: continue baclofen.  7. FEN:   MVI with Fe per pt request.--she uses Fe for RLS  LOS (Days) 8 A FACE TO FACE EVALUATION WAS PERFORMED  SWARTZ,ZACHARY T 03/09/2013 8:10 AM

## 2013-03-09 NOTE — Progress Notes (Signed)
Physical Therapy Session Note  Patient Details  Name: Gail Gilbert MRN: [id number] Date of Birth: [birth year]  Today's Date: 03/09/2013 Time: 1300-1500 Time Calculation (min): 90 min  Skilled Therapeutic Interventions/Progress Updates:   Pt participated in community outing, performing gait with RW or shopping cart throughout the store x 60 min, including negotiating aisles, racks, van steps and public restroom.  Pt able to identify barriers and energy conservation techniques.   Therapy Documentation Precautions:  Precautions Precautions: Fall Restrictions Weight Bearing Restrictions: No See FIM for current functional status  Therapy/Group: Individual Therapy  Georges Mouse 03/09/2013, 3:16 PM

## 2013-03-09 NOTE — Progress Notes (Signed)
Recreational Therapy Discharge Summary Patient Details  Name: Gail Gilbert MRN: [id number] Date of Birth: June 09, 1951 Today's Date: 03/09/2013  Long term goals set: 2  Long term goals met: 2  Comments on progress toward goals: Pt has made excellent progress toward goals and is ready for discharge tomorrow Mod I w/c level and supervision for ambulation & community pursuits.  Education provided on energy conservation techniques.  Reasons for discharge: discharge from hospital  Patient/family agrees with progress made and goals achieved: Yes  Gail Gilbert 03/09/2013, 5:04 PM

## 2013-03-10 MED ORDER — POLYETHYLENE GLYCOL 3350 17 G PO PACK: 17.0000 g | PACK | Freq: Two times a day (BID) | ORAL | Status: DC

## 2013-03-10 MED ORDER — TRAZODONE HCL 50 MG PO TABS: 50.0000 mg | ORAL_TABLET | Freq: Every day | ORAL | Status: DC

## 2013-03-10 MED ORDER — BACLOFEN 10 MG PO TABS: 5.0000 mg | ORAL_TABLET | Freq: Four times a day (QID) | ORAL | Status: DC

## 2013-03-10 NOTE — Discharge Summary (Signed)
Physician Discharge Summary  Patient ID: Gail Gilbert MRN: [id number] DOB/AGE: 04-09-1951 62 y.o.  Admit date: 03/01/2013 Discharge date: 03/10/2013  Discharge Diagnoses:  Principal Problem:   Idiopathic transverse myelitis Active Problems:   Spondylolisthesis   Anxiety   UTI (urinary tract infection) due to E. coli   Discharged Condition: Good.     Labs:  Basic Metabolic Panel:  Recent Labs Lab 03/08/13 0615  CREATININE 0.75    CBC: No results found for this basename: WBC, NEUTROABS, HGB, HCT, MCV, PLT,  in the last 168 hours  CBG: No results found for this basename: GLUCAP,  in the last 168 hours  HPI:   Gail Gilbert is a 62 y.o. right-handed female with history of low back pain status post lumbar L4 decompression laminectomy 1999, restless leg syndrome.. Admitted 02/21/2011 with tingling in her feet and lower extremity weakness with staggering gait as well his right upper extremity numbness and weakness. Neurology services consulted with suspect transverse myelitis as patient was noted to have marked proximal weakness BLE with sustained clonus at ankles as well as hyperreflexia BUE with mild distal weakness R>L hand. Solu-Medrol. She was treated with IV solumedrol and baclofen was added for spasticity. Patient had improvement in LE strength as well as mobility. Foley  wasdiscontinued and she was noted to have problems with voiding as well as constipation. As patient continued with decreased balance, impaired coordination as well as BLE instability,  therapy team recommended CIR level therapies for progression.    Hospital Course: Gail Gilbert was admitted to rehab 03/01/2013 for inpatient therapies to consist of PT, ST and OT at least three hours five days a week. Past admission physiatrist, therapy team and rehab RN have worked together to provide customized collaborative inpatient rehab. Rehab RN has worked with patient on B/B program. Urine culture was done revealing E.  Coli infection and patient was treated with seven day course of cipro. Pain control has been reasonable on mobic.  Trazodone was added and has been effective in treatment of insomnia.  She continues on baclofen for spasticity management. Anxiety levels have greatly improved. L-ASO was ordered to help with ankle support.    During patient's stay in rehab  team conference was held to monitor patient's progress, set goals and discuss barriers to discharge. Physical and Occupational therapy has worked with patient on self care tasks as well as mobility.  Patient is showing    improved activity tolerance, improved balance, postural control. She is improved in her ability to compensate for deficits and has had improvement in functional use and coordination of BUE/;BLE remity, Patient to discharge at overall Modified Independent level from wheel chair for ADL tasks and  supervision for ambulating due to occasional loss of balance. Further outpatient therapies to continue past discharge. Patient advised to follow up with Dr. Merla Riches in 2 weeks for post hospital check.      Disposition: [facility] Institution Not Defined   Future Appointments Provider Department Dept Phone   03/15/2013 11:15 AM Nestor Lewandowsky, PT [facility] [phone]   03/21/2013 12:40 PM Ranelle Oyster, MD [facility] [phone]       Medication List    TAKE these medications       baclofen 10 MG tablet  Commonly known as:  LIORESAL  Take 0.5 tablets (5 mg total) by mouth 4 (four) times daily. For spasticity.     cholecalciferol 1000 UNITS tablet  Commonly known as:  VITAMIN D  Take 1,000 Units by mouth every morning.     fish oil-omega-3 fatty acids 1000 MG capsule  Take 2 g by mouth every morning.     Melatonin 5 MG Caps  Take 1 capsule by mouth at bedtime as needed (sleep).     meloxicam 15 MG tablet  Commonly known as:   MOBIC  Take 15 mg by mouth every morning.     multivitamin capsule  Take 1 capsule by mouth daily.     polyethylene glycol packet  Commonly known as:  MIRALAX / GLYCOLAX  Take 17 g by mouth 2 (two) times daily. For constipation     sertraline 100 MG tablet  Commonly known as:  ZOLOFT  Take 50 mg by mouth every morning.     traZODone 50 MG tablet  Commonly known as:  DESYREL  Take 1 tablet (50 mg total) by mouth at bedtime. For insomnia.           Follow-up Information   Follow up with Ranelle Oyster, MD.   Contact information:   [address], [address] [place] [address] [phone]       Follow up with [facility]. Call today. (for follow up in 4-6 weeks.)    Contact information:   [address] [place] [address] [phone]      Signed: Jacquelynn Cree 03/10/2013, 10:52 AM  CC: Thana Farr, M.D.         [facility]          Ellamae Sia, M.D.

## 2013-03-10 NOTE — Progress Notes (Signed)
Social Work  Discharge Note  The overall goal for the admission was met for:   Discharge location: Yes - home with son and daughter-in-law to assist as needed  Length of Stay: Yes - 9 days  Discharge activity level: Yes - supervision to modified independent  Home/community participation: Yes  Services provided included: MD, RD, PT, OT, RN, TR, Pharmacy and SW  Financial Services: Other: Medicaid application pending  Follow-up services arranged: Outpatient: PT via [facility], DME: 16x18 Breezy w/c, cushion, 3n1 commode, tub seat via [facility] and Patient/Family has no preference for HH/DME agencies  Comments (or additional information): Provided information on online resources via www.myelitis.org and review processes of Medicaid and SSD applications  Patient/Family verbalized understanding of follow-up arrangements: Yes  Individual responsible for coordination of the follow-up plan: patient  Confirmed correct DME delivered: Larico Dimock 03/10/2013    Michon Kaczmarek

## 2013-03-10 NOTE — Progress Notes (Signed)
Subjective/Complaints: No problems. Liked aso. Had a good outing. A 12 point review of systems has been performed and if not noted above is otherwise negative.   Objective: Vital Signs: Blood pressure 97/59, pulse 62, temperature 98.2 F (36.8 C), temperature source Oral, resp. rate 20, weight 61.598 kg (135 lb 12.8 oz), SpO2 96.00%. No results found. No results found for this basename: WBC, HGB, HCT, PLT,  in the last 72 hours  Recent Labs  03/08/13 0615  CREATININE 0.75   CBG (last 3)  No results found for this basename: GLUCAP,  in the last 72 hours  Wt Readings from Last 3 Encounters:  03/09/13 61.598 kg (135 lb 12.8 oz)  02/23/13 61.689 kg (136 lb)  08/05/12 61.689 kg (136 lb)    Physical Exam:  .Constitutional: She is oriented to person, place, and time. She appears well-developed and well-nourished.  HENT:  Head: Normocephalic and atraumatic.  Eyes: Pupils are equal, round, and reactive to light.  Neck: Normal range of motion. Neck supple.  Cardiovascular: Normal rate and regular rhythm.  Pulmonary/Chest: Effort normal and breath sounds normal.  Abdominal: Soft. Bowel sounds are normal. She exhibits no distension. There is no tenderness.  Musculoskeletal: She exhibits no edema and no tenderness.  Neurological: She is alert and oriented to person, place, and time.  BLE with improved coordination. Still with impaired sensation RLE>LLE. Impaired proprioception. Impaired sensation BLE below trunk. Mild sensory loss in hands to forearms. UE strength grossly 5/5 except for HI which were  4+/5. LE 4/5 proximal to distal. Walks with slight steppage gait pattern, recurvatum at left knee with stance. Skin: Skin is warm and dry.  Psychiatric: She is pleasant. Her behavior is still slightly anxious.  Judgment and thought content normal.    Assessment/Plan: 1. Functional deficits secondary to cervico-thoracic transverse myelitis which require 3+ hours per day of interdisciplinary  therapy in a comprehensive inpatient rehab setting. Physiatrist is providing close team supervision and 24 hour management of active medical problems listed below. Physiatrist and rehab team continue to assess barriers to discharge/monitor patient progress toward functional and medical goals.  Dc today. outpt follow up arranged. i will see her in the office within the month.  FIM: FIM - Bathing Bathing Steps Patient Completed: Chest;Right Arm;Left Arm;Abdomen;Front perineal area;Buttocks;Right upper leg;Left upper leg;Right lower leg (including foot);Left lower leg (including foot) Bathing: 6: Assistive device (Comment)  FIM - Upper Body Dressing/Undressing Upper body dressing/undressing steps patient completed: Thread/unthread right bra strap;Thread/unthread left bra strap;Hook/unhook bra;Thread/unthread right sleeve of pullover shirt/dresss;Thread/unthread left sleeve of pullover shirt/dress;Put head through opening of pull over shirt/dress;Pull shirt over trunk Upper body dressing/undressing: 7: Complete Independence: No helper FIM - Lower Body Dressing/Undressing Lower body dressing/undressing steps patient completed: Thread/unthread right underwear leg;Thread/unthread left underwear leg;Pull underwear up/down;Thread/unthread right pants leg;Thread/unthread left pants leg;Pull pants up/down;Don/Doff right sock;Don/Doff left sock;Don/Doff right shoe;Don/Doff left shoe;Fasten/unfasten right shoe;Fasten/unfasten left shoe Lower body dressing/undressing: 6: Assistive device (Comment)  FIM - Toileting Toileting steps completed by patient: Adjust clothing prior to toileting;Performs perineal hygiene;Adjust clothing after toileting Toileting Assistive Devices: Grab bar or rail for support Toileting: 6: Assistive device: No helper  FIM - Diplomatic Services operational officer Devices: Art gallery manager Transfers: 6-Assistive device: No helper  FIM - Landscape architect Devices: Environmental consultant;Arm rests Bed/Chair Transfer: 6: More than reasonable amt of time  FIM - Locomotion: Wheelchair Locomotion: Wheelchair: 6: Travels 150 ft or more, turns around, maneuvers to table, bed or toilet, negotiates 3% grade:  maneuvers on rugs and over door sills independently FIM - Locomotion: Ambulation Locomotion: Ambulation Assistive Devices: Walker - Rolling Ambulation/Gait Assistance: 5: Supervision Locomotion: Ambulation: 6: Travels 150 ft or more with assistive device/no helper  Comprehension Comprehension Mode: Auditory Comprehension: 7-Follows complex conversation/direction: With no assist  Expression Expression Mode: Verbal Expression: 7-Expresses complex ideas: With no assist  Social Interaction Social Interaction: 6-Interacts appropriately with others with medication or extra time (anti-anxiety, antidepressant).  Problem Solving Problem Solving: 6-Solves complex problems: With extra time  Memory Memory: 7-Complete Independence: No helper  Medical Problem List and Plan:  1. DVT Prophylaxis/Anticoagulation: Pharmaceutical: Lovenox  2. Pain Management: continue mobic for acute on chronic back pain.  3. Mood: Continue zoloft for history of depression. Trazodone has been helpful for sleep  -team providing positive reinforcement  -mood up beat and positive 4. Neuropsych: This patient is capable of making decisions on his/her own behalf.  5. GU: E coli UTI--7 day course of cipro -bladder function improved 6. Spasticity: continue baclofen.  7. FEN:   MVI with Fe per pt request.--she uses Fe for RLS  LOS (Days) 9 A FACE TO FACE EVALUATION WAS PERFORMED  Jemarcus Dougal T 03/10/2013 8:19 AM

## 2013-03-15 ENCOUNTER — Ambulatory Visit: Payer: Self-pay | Admitting: Physical Therapy

## 2013-03-16 ENCOUNTER — Ambulatory Visit
Payer: Medicaid Other | Attending: Physical Medicine & Rehabilitation | Admitting: Rehabilitative and Restorative Service Providers"

## 2013-03-16 DIAGNOSIS — R269 Unspecified abnormalities of gait and mobility: Secondary | ICD-10-CM | POA: Insufficient documentation

## 2013-03-16 DIAGNOSIS — M6281 Muscle weakness (generalized): Secondary | ICD-10-CM | POA: Insufficient documentation

## 2013-03-16 DIAGNOSIS — IMO0001 Reserved for inherently not codable concepts without codable children: Secondary | ICD-10-CM | POA: Insufficient documentation

## 2013-03-21 ENCOUNTER — Encounter: Payer: Self-pay | Admitting: Physical Medicine & Rehabilitation

## 2013-03-21 ENCOUNTER — Encounter: Payer: Medicaid Other | Attending: Physical Medicine & Rehabilitation | Admitting: Physical Medicine & Rehabilitation

## 2013-03-21 VITALS — BP 162/140 | HR 81 | Resp 15 | Ht 68.0 in | Wt 141.6 lb

## 2013-03-21 DIAGNOSIS — Q762 Congenital spondylolisthesis: Secondary | ICD-10-CM

## 2013-03-21 DIAGNOSIS — M4310 Spondylolisthesis, site unspecified: Secondary | ICD-10-CM

## 2013-03-21 DIAGNOSIS — G373 Acute transverse myelitis in demyelinating disease of central nervous system: Secondary | ICD-10-CM

## 2013-03-21 DIAGNOSIS — G2581 Restless legs syndrome: Secondary | ICD-10-CM

## 2013-03-21 DIAGNOSIS — G4700 Insomnia, unspecified: Secondary | ICD-10-CM | POA: Insufficient documentation

## 2013-03-21 DIAGNOSIS — G0489 Other myelitis: Secondary | ICD-10-CM | POA: Insufficient documentation

## 2013-03-21 DIAGNOSIS — M129 Arthropathy, unspecified: Secondary | ICD-10-CM | POA: Insufficient documentation

## 2013-03-21 LAB — FUNGUS CULTURE W SMEAR: Fungal Smear: NONE SEEN

## 2013-03-21 NOTE — Patient Instructions (Signed)
Facet Syndrome Facet syndrome is a condition where injury to the small joints between the bones in the spine (facet joints) causes back pain. Over rotation (twisting) or arching (extension) of the back may injure the joints or the soft disks between the spinal bones. Such injuries result in excessive motion of the facet joint. This causes the cartilage covering the facet joint to wear down. That places pressure on nerves, as they exit the spinal cord.  SYMPTOMS   Chronic dull ache in the low back, that gets worse with over-extension and rotation.  Pain in the low back, buttocks, hip, and sometimes leg.  Sometimes, stiffness of the low back. CAUSES  Facet syndrome is often caused by repeated or over rotation, over-extension, or extension with rotation of the back. These motions cause injury to the cartilage covering the facet joints. This places pressure on the spinal nerves. RISK INCREASES WITH:  Sports that can cause over-extension of the back, with rotation or repeatedly (golf, football, gymnastics, diving, weight-lifting, dancing, rifle shooting, wrestling, tennis, swimming, volleyball, track and field, rugby, other contact sports).  Poor back strength and flexibility.  Poor exercise technique. PREVENTION   Learn and use proper technique.  Warm up and stretch properly before activity.  Maintain physical fitness:  Back and hamstring flexibility.  Back muscle strength and endurance.  Cardiovascular fitness. PROGNOSIS  This condition is often resolved with proper non-surgical treatment.  RELATED COMPLICATIONS   Recurring symptoms, resulting in a chronic problem.  Delayed healing, especially if sports are resumed too soon.  Prolonged impairment.  Narrowed canal for the spinal cord, due to bone spurs (bumps) resulting from chronic erosion of the facet joints (spinal stenosis). TREATMENT  Treatment first involves stopping activities that aggravate your symptoms. Ice and  medicines may be used to reduce pain and inflammation. Your caregiver may advise strength and stretching activities, to be completed at home or with a therapist. You may be referred to a physical therapist for further treatment, including: ultrasound, manual adjustments, transcutaneous electronic nerve stimulation (TENS). Surgery is rarely needed. It is reserved for athletes with persistent pain, despite 6 to 12 months of proper non-surgical treatment. Surgery involves joining (fusing) two bones of the spinal column, to stop motion between the facet joint and disk. MEDICATION   If pain medicine is needed, nonsteroidal anti-inflammatory medicines (aspirin and ibuprofen), or other minor pain relievers (acetaminophen), are often advised.  Do not take pain medicine for 7 days before surgery.  Stronger pain relievers may be prescribed. Use only as directed and only as much as you need. HEAT AND COLD  Cold treatment (icing) relieves pain and reduces inflammation. Cold treatment should be applied for 10 to 15 minutes every 2 to 3 hours, and immediately after activity that aggravates your symptoms. Use ice packs or an ice massage.  Heat treatment may be used before performing stretching and strengthening activities advised by your caregiver, physical therapist, or athletic trainer. Use a heat pack or a warm water soak. SEEK MEDICAL CARE IF:   Symptoms get worse or do not improve in 2 to 4 weeks, despite treatment.  You develop numbness, weakness, or loss of bladder or bowel function.  New, unexplained symptoms develop. (Drugs used in treatment may produce side effects.) Document Released: 12/01/2005 Document Revised: 02/23/2012 Document Reviewed: 03/15/2009 [facility] Patient Information 2013 [place], [place].

## 2013-03-21 NOTE — Progress Notes (Signed)
Subjective:    Patient ID: Gail Gilbert, female    DOB: 1951-06-03, 62 y.o.   MRN: [id number]  HPI  Gail Gilbert is back regarding her transverse myelitis. She was recently discharged from inpatient rehab. She is pending outpatient therapy but has been able to do some things on her own via a HEP.   She is planning on moving to an apartment and wants to know if it is something she will be able to do anytime soon. She hadn't been driving but attempted today in coming to my office. She has done laundry at home, cleaned the dishes, etc. She has experienced no falls or near misses as of yet.   She has experienced some low back pain when she's been standing for longer lengths of time. She had begun to have this problem prior to the TM admission.     Pain Inventory Average Pain 5 Pain Right Now 1 My pain is n/a  In the last 24 hours, has pain interfered with the following? General activity 3 Relation with others 1 Enjoyment of life 3 What TIME of day is your pain at its worst? day time,evening Sleep (in general) n/a  Pain is worse with: walking and standing Pain improves with: rest, heat/ice and medication Relief from Meds: 5  Mobility use a walker how many minutes can you walk? 20 min. ability to climb steps?  yes do you drive?  yes  Function disabled: date disabled n/a  Neuro/Psych weakness numbness trouble walking  Prior Studies Any changes since last visit?  no  Physicians involved in your care Any changes since last visit?  no   Family History  Problem Relation Age of Onset  . Arthritis Mother     spinal stenosis  . Cataracts Mother   . Heart disease Mother     pacemaker  . Hypertension Father   . Heart disease Father     pacer/defibrillator   History   Social History  . Marital Status: Married    Spouse Name: Elijah Birk    Number of Children: 1  . Years of Education: N/A   Occupational History  . unemployed     former Haematologist   Social History Main  Topics  . Smoking status: Never Smoker   . Smokeless tobacco: Never Used  . Alcohol Use: Yes  . Drug Use: No  . Sexually Active: Yes -- Female partner(s)    Birth Control/ Protection: Post-menopausal   Other Topics Concern  . None   Social History Narrative   Divorced.   Lives alone.   Adult children live in town.  Her parents are in [place]          Past Surgical History  Procedure Laterality Date  . Colonoscopy    . Bunionectomy  09,05    both feet  . Upper gastrointestinal endoscopy    . Ear examination under anesthesia      to correct ringing in ear at [facility]  . Lumbar aspiration and drainage      cyst  . Hammer toe surgery  10/29/2011    Procedure: HAMMER TOE CORRECTION;  Surgeon: Nestor Lewandowsky;  Location: [facility];  Service: Orthopedics;  Laterality: Left;  Left 2nd toe duvries arthroplasty  . Decompressive lumbar laminectomy level 4  1999   Past Medical History  Diagnosis Date  . Arthritis     cervical,feet  . Depression   . Migraine   . GERD (gastroesophageal reflux disease) 12/2006  esophageal stricture  . Vitamin D deficiency   . MVP (mitral valve prolapse)   . Spondylolisthesis     L4-5  . RLS (restless legs syndrome)   . Anxiety   . Interstitial cystitis   . IBS (irritable bowel syndrome)   . Insomnia   . DVT (deep vein thrombosis) in pregnancy 1989    postpartum   BP 162/140  Pulse 81  Resp 15  Ht 5\' 8"  (1.727 m)  Wt 141 lb 9.6 oz (64.229 kg)  BMI 21.54 kg/m2  SpO2 96%     Review of Systems  Musculoskeletal: Positive for gait problem.  Neurological: Positive for weakness and numbness.  All other systems reviewed and are negative.       Objective:   Physical Exam  General: Alert and oriented x 3, No apparent distress HEENT: Head is normocephalic, atraumatic, PERRLA, EOMI, sclera anicteric, oral mucosa pink and moist, dentition intact, ext ear canals clear,  Neck: Supple without JVD or lymphadenopathy Heart:  Reg rate and rhythm. No murmurs rubs or gallops Chest: CTA bilaterally without wheezes, rales, or rhonchi; no distress Abdomen: Soft, non-tender, non-distended, bowel sounds positive. Extremities: No clubbing, cyanosis, or edema. Pulses are 2+ Skin: Clean and intact without signs of breakdown Neuro:Alert and oriented x 3. Cognition is normal. She has some diminished FMC of the feet. strength is near normal in both feet. She has mild weakness in the right HF and HE as well.  Right HI are 4 to 4+. She walks with abduction of the right hip and right tilt in weight bearing in a trendelenburg pattern. She has fair balance however as a whole. Romberg test was negative.  Musculoskeletal: mild pain with extension in the lumbar spine.  Psych: Pt's affect is appropriate. Pt is cooperative         Assessment & Plan:  1. Cervico-thoracic transverse myelitis 2. Lumbar facet arthropathy 3. Insomnia   Plan: 1. She may resume driving and independent living. She should be using her walker for balance. I am ok with her using a HEP for therapy at this point. We reviewed pilates and facet principles today. 2. She needs to follow up with neurology at some point this spring although she is fairly stable at present. 3. Continue trazodone for sleep, baclofen prn for spams. She may use meloxicam for pain control as well. 4. I will see her here in follow up as needed.

## 2013-03-25 ENCOUNTER — Ambulatory Visit: Payer: Medicaid Other | Admitting: Rehabilitative and Restorative Service Providers"

## 2013-05-16 ENCOUNTER — Other Ambulatory Visit: Payer: Self-pay | Admitting: *Deleted

## 2013-05-16 MED ORDER — TRAZODONE HCL 50 MG PO TABS: 50.0000 mg | ORAL_TABLET | Freq: Every day | ORAL | Status: DC

## 2013-05-19 ENCOUNTER — Other Ambulatory Visit: Payer: Self-pay

## 2013-05-19 NOTE — Telephone Encounter (Signed)
Duplicate trazadone refill request.

## 2013-05-30 ENCOUNTER — Other Ambulatory Visit: Payer: Self-pay | Admitting: Physician Assistant

## 2013-05-31 ENCOUNTER — Other Ambulatory Visit: Payer: Self-pay

## 2013-05-31 MED ORDER — BACLOFEN 10 MG PO TABS: 5.0000 mg | ORAL_TABLET | Freq: Four times a day (QID) | ORAL | Status: DC

## 2013-08-23 ENCOUNTER — Ambulatory Visit: Payer: Medicaid Other | Attending: Internal Medicine | Admitting: Internal Medicine

## 2013-08-23 ENCOUNTER — Encounter: Payer: Self-pay | Admitting: Internal Medicine

## 2013-08-23 VITALS — BP 133/78 | HR 75 | Temp 98.0°F | Resp 16 | Wt 141.4 lb

## 2013-08-23 DIAGNOSIS — R29898 Other symptoms and signs involving the musculoskeletal system: Secondary | ICD-10-CM

## 2013-08-23 DIAGNOSIS — B351 Tinea unguium: Secondary | ICD-10-CM

## 2013-08-23 DIAGNOSIS — F411 Generalized anxiety disorder: Secondary | ICD-10-CM

## 2013-08-23 DIAGNOSIS — G0491 Myelitis, unspecified: Secondary | ICD-10-CM

## 2013-08-23 DIAGNOSIS — G373 Acute transverse myelitis in demyelinating disease of central nervous system: Secondary | ICD-10-CM

## 2013-08-23 DIAGNOSIS — G4700 Insomnia, unspecified: Secondary | ICD-10-CM | POA: Insufficient documentation

## 2013-08-23 DIAGNOSIS — F419 Anxiety disorder, unspecified: Secondary | ICD-10-CM

## 2013-08-23 DIAGNOSIS — G0490 Encephalitis and encephalomyelitis, unspecified: Secondary | ICD-10-CM

## 2013-08-23 MED ORDER — BACLOFEN 10 MG PO TABS: 5.0000 mg | ORAL_TABLET | Freq: Four times a day (QID) | ORAL | Status: DC

## 2013-08-23 MED ORDER — TERBINAFINE HCL 1 % EX CREA: TOPICAL_CREAM | Freq: Two times a day (BID) | CUTANEOUS | Status: DC

## 2013-08-23 MED ORDER — TRAZODONE HCL 50 MG PO TABS: 50.0000 mg | ORAL_TABLET | Freq: Every evening | ORAL | Status: DC | PRN

## 2013-08-23 NOTE — Progress Notes (Signed)
Patient ID: Gail Gilbert, female   DOB: [birth year], 62 y.o.   MRN: [id number] Patient Demographics  Gail Gilbert, is a 62 y.o. female  ZHY:[id number]  [id number]  DOB - Oct 08, 1951  Chief Complaint  Patient presents with  . Establish Care        Subjective:   Gail Gilbert is a 62 year old female with history of anxiety, depression, history of chronic low back pain status post L4 decompression, laminectomy 1999, idiopathic transverse myelitis in March 2014. Patient was under the care of family practice residency at [facility]. She had several MRIs, spinal tap done, neurology consultation with Dr. Ferne Coe. Patient improved significantly with steroids. All the workup was negative for etiology. Patient had undergone inpatient rehabilitation.  The patient is here today establish primary care. Patient has No headache, No chest pain, No abdominal pain - No Nausea, No new weakness tingling or numbness, No Cough - SOB. Patient states that she has mild numbness and tingling in the fingers and sometimes difficulty with fine motor skills but mostly she is doing okay. Her balance has improved. She feels a lot of anxiety as she has lost her job and requested for psychology counseling.  Objective:    Filed Vitals:   08/23/13 1410  BP: 133/78  Pulse: 75  Temp: 98 F (36.7 C)  Resp: 16  Weight: 141 lb 6.4 oz (64.139 kg)  SpO2: 100%     ALLERGIES:   Allergies  Allergen Reactions  . Antihistamines, Diphenhydramine-Type Palpitations    PAST MEDICAL HISTORY: Past Medical History  Diagnosis Date  . Arthritis     cervical,feet  . Depression   . Migraine   . GERD (gastroesophageal reflux disease) 12/2006    esophageal stricture  . Vitamin D deficiency   . MVP (mitral valve prolapse)   . Spondylolisthesis     L4-5  . RLS (restless legs syndrome)   . Anxiety   . Interstitial cystitis   . IBS (irritable bowel syndrome)   . Insomnia   . DVT (deep vein thrombosis) in  pregnancy 1989    postpartum    PAST SURGICAL HISTORY: Past Surgical History  Procedure Laterality Date  . Colonoscopy    . Bunionectomy  09,05    both feet  . Upper gastrointestinal endoscopy    . Ear examination under anesthesia      to correct ringing in ear at [facility]  . Lumbar aspiration and drainage      cyst  . Hammer toe surgery  10/29/2011    Procedure: HAMMER TOE CORRECTION;  Surgeon: Nestor Lewandowsky;  Location: [facility];  Service: Orthopedics;  Laterality: Left;  Left 2nd toe duvries arthroplasty  . Decompressive lumbar laminectomy level 4  1999    FAMILY HISTORY: Family History  Problem Relation Age of Onset  . Arthritis Mother     spinal stenosis  . Cataracts Mother   . Heart disease Mother     pacemaker  . Hypertension Father   . Heart disease Father     pacer/defibrillator    MEDICATIONS AT HOME: Prior to Admission medications   Medication Sig Start Date End Date Taking? Authorizing Provider  baclofen (LIORESAL) 10 MG tablet Take 0.5 tablets (5 mg total) by mouth 4 (four) times daily. For spasticity. 08/23/13  Yes Ripudeep Jenna Luo, MD  fish oil-omega-3 fatty acids 1000 MG capsule Take 2 g by mouth every morning.    Yes Historical Provider, MD  Multiple Vitamin (MULTIVITAMIN) capsule Take 1  capsule by mouth daily.     Yes Historical Provider, MD  naproxen sodium (ANAPROX) 220 MG tablet Take 220 mg by mouth 2 (two) times daily with a meal.   Yes Historical Provider, MD  cholecalciferol (VITAMIN D) 1000 UNITS tablet Take 1,000 Units by mouth every morning.    Historical Provider, MD  polyethylene glycol (MIRALAX / GLYCOLAX) packet Take 17 g by mouth 2 (two) times daily. For constipation 03/10/13   Evlyn Kanner Love, PA-C  terbinafine (EQ ATHLETES FOOT, TERBINAFINE,) 1 % cream Apply topically 2 (two) times daily. 08/23/13   Ripudeep Jenna Luo, MD  traZODone (DESYREL) 50 MG tablet Take 1 tablet (50 mg total) by mouth at bedtime as needed for sleep. For  insomnia. 08/23/13   Ripudeep Jenna Luo, MD    REVIEW OF SYSTEMS:  Constitutional:   No   Fevers, chills, fatigue.  HEENT:    No headaches, Sore throat,   Cardio-vascular: No chest pain,  Orthopnea, swelling in lower extremities, anasarca, palpitations  GI:  No abdominal pain, nausea, vomiting, diarrhea  Resp: No shortness of breath,  No coughing up of blood.No cough.No wheezing.  Skin:  no rash or lesions.  GU:  no dysuria, change in color of urine, no urgency or frequency.  No flank pain.  Musculoskeletal: No joint pain or swelling.  No decreased range of motion.  No back pain.  Psych: No change in mood or affect. No depression or anxiety.  No memory loss.   Exam  General appearance :Awake, alert, NAD, Speech Clear. HEENT: Atraumatic and Normocephalic, PERLA Neck: supple, no JVD. No cervical lymphadenopathy.  Chest: clear to auscultation bilaterally, no wheezing, rales or rhonchi CVS: S1 S2 regular, no murmurs.  Abdomen: soft, NBS, NT, ND, no gaurding, rigidity or rebound. Extremities: No cyanosis, clubbing, B/L Lower Ext shows no edema,  Neurology: Awake alert, and oriented X 3, CN II-XII intact, Non focal Skin:No Rash or lesions Wounds: N/A    Data Review   Basic Metabolic Panel: No results found for this basename: NA, K, CL, CO2, GLUCOSE, BUN, CREATININE, CALCIUM, MG, PHOS,  in the last 168 hours Liver Function Tests: No results found for this basename: AST, ALT, ALKPHOS, BILITOT, PROT, ALBUMIN,  in the last 168 hours  CBC: No results found for this basename: WBC, NEUTROABS, HGB, HCT, MCV, PLT,  in the last 168 hours ------------------------------------------------------------------------------------------------------------------ No results found for this basename: HGBA1C,  in the last 72 hours ------------------------------------------------------------------------------------------------------------------ No results found for this basename: CHOL, HDL,  LDLCALC, TRIG, CHOLHDL, LDLDIRECT,  in the last 72 hours ------------------------------------------------------------------------------------------------------------------ No results found for this basename: TSH, T4TOTAL, FREET3, T3FREE, THYROIDAB,  in the last 72 hours ------------------------------------------------------------------------------------------------------------------ No results found for this basename: VITAMINB12, FOLATE, FERRITIN, TIBC, IRON, RETICCTPCT,  in the last 72 hours  Coagulation profile  No results found for this basename: INR, PROTIME,  in the last 168 hours    Assessment & Plan   Active Problems: 1. history of idiopathic transverse myelitis: Currently doing well, refilled her baclofen  2. history of anxiety and depression - Ambulatory referral to psychology [facility]  3. Insomnia: - Placed on trazodone, states that she has done well with trazodone in the past  4. Onychomycosis, left big toenail: Patient reports that this happened after pedicure - Placed on terbinafine cream BID  5 health maintenance.  - Patient reports that she had mammogram and Pap smear last year    Follow-up in  3 months   RAI,RIPUDEEP M.D. 08/23/2013, 2:36  PM

## 2013-08-23 NOTE — Progress Notes (Signed)
Patient here to establish care History of anxiety and depression

## 2013-08-24 ENCOUNTER — Ambulatory Visit: Payer: Medicaid Other | Attending: Internal Medicine

## 2013-08-24 NOTE — Progress Notes (Unsigned)
CSW met with patient in order to assess needs.  Patient states that she has been dealing with her anxiety for yeats.  Patient states that her anxiety is well controlled but she does have some challenges managing her anxiety in the area of her finances due to her current state of not working.  Patient stated that she is seeking counseling support with a Librarian, academic.  CSW encouraged patient to check local resources, like her church and CSW with explore other options.  CSW and patient will reconvene within the next week in order to identify next step in plan for care and further support.  Beverly Sessions MSW, LCSW [phone] 45 min

## 2013-11-22 ENCOUNTER — Ambulatory Visit (HOSPITAL_COMMUNITY)
Admission: RE | Admit: 2013-11-22 | Discharge: 2013-11-22 | Disposition: A | Discharge status: Home / Self Care | Payer: Medicaid Other | Source: Ambulatory Visit | Attending: Internal Medicine | Admitting: Internal Medicine

## 2013-11-22 ENCOUNTER — Encounter: Payer: Self-pay | Admitting: Internal Medicine

## 2013-11-22 ENCOUNTER — Ambulatory Visit: Payer: Medicaid Other | Attending: Internal Medicine | Admitting: Internal Medicine

## 2013-11-22 VITALS — BP 107/71 | HR 84 | Temp 98.4°F | Resp 16 | Ht 67.0 in | Wt 143.0 lb

## 2013-11-22 DIAGNOSIS — F32A Depression, unspecified: Secondary | ICD-10-CM | POA: Insufficient documentation

## 2013-11-22 DIAGNOSIS — M25559 Pain in unspecified hip: Secondary | ICD-10-CM

## 2013-11-22 DIAGNOSIS — F329 Major depressive disorder, single episode, unspecified: Secondary | ICD-10-CM | POA: Insufficient documentation

## 2013-11-22 DIAGNOSIS — F3289 Other specified depressive episodes: Secondary | ICD-10-CM | POA: Insufficient documentation

## 2013-11-22 DIAGNOSIS — M25551 Pain in right hip: Secondary | ICD-10-CM

## 2013-11-22 DIAGNOSIS — G373 Acute transverse myelitis in demyelinating disease of central nervous system: Secondary | ICD-10-CM | POA: Insufficient documentation

## 2013-11-22 DIAGNOSIS — F419 Anxiety disorder, unspecified: Secondary | ICD-10-CM

## 2013-11-22 DIAGNOSIS — R2689 Other abnormalities of gait and mobility: Secondary | ICD-10-CM | POA: Insufficient documentation

## 2013-11-22 DIAGNOSIS — F411 Generalized anxiety disorder: Secondary | ICD-10-CM

## 2013-11-22 DIAGNOSIS — R269 Unspecified abnormalities of gait and mobility: Secondary | ICD-10-CM

## 2013-11-22 DIAGNOSIS — G4700 Insomnia, unspecified: Secondary | ICD-10-CM | POA: Insufficient documentation

## 2013-11-22 LAB — CBC WITH DIFFERENTIAL/PLATELET
Eosinophils Absolute: 0.2 10*3/uL (ref 0.0–0.7)
Eosinophils Relative: 4 % (ref 0–5)
HCT: 36.4 % (ref 36.0–46.0)
Lymphocytes Relative: 32 % (ref 12–46)
Lymphs Abs: 1.6 10*3/uL (ref 0.7–4.0)
MCH: 31 pg (ref 26.0–34.0)
MCV: 90.3 fL (ref 78.0–100.0)
Monocytes Absolute: 0.5 10*3/uL (ref 0.1–1.0)
Monocytes Relative: 10 % (ref 3–12)
Platelets: 302 10*3/uL (ref 150–400)
RBC: 4.03 MIL/uL (ref 3.87–5.11)
WBC: 5 10*3/uL (ref 4.0–10.5)

## 2013-11-22 LAB — CMP AND LIVER
ALT: 15 U/L (ref 0–35)
AST: 23 U/L (ref 0–37)
Alkaline Phosphatase: 81 U/L (ref 39–117)
BUN: 14 mg/dL (ref 6–23)
Calcium: 9.3 mg/dL (ref 8.4–10.5)
Chloride: 103 mEq/L (ref 96–112)
Creat: 0.67 mg/dL (ref 0.50–1.10)
Potassium: 4.3 mEq/L (ref 3.5–5.3)

## 2013-11-22 MED ORDER — SERTRALINE HCL 100 MG PO TABS: 50.0000 mg | ORAL_TABLET | Freq: Every day | ORAL | Status: DC

## 2013-11-22 MED ORDER — TRAZODONE HCL 50 MG PO TABS: 50.0000 mg | ORAL_TABLET | Freq: Every evening | ORAL | Status: DC | PRN

## 2013-11-22 NOTE — Progress Notes (Signed)
Patient ID: Gail Gilbert, female   DOB: 1951/12/07, 63 y.o.   MRN: [id number] Patient Demographics  Gail Gilbert, is a 62 y.o. female  [id number]  [id number]  DOB - 06/03/1951  Chief Complaint  Patient presents with  . Follow-up        Subjective:   Gail Gilbert is a 62 y.o. female here today for a follow up visit. She has history of anxiety, depression, history of chronic low back pain status post L4 decompression, laminectomy 1999, idiopathic transverse myelitis in March 2014. She is here today to refill her antidepressants. She also claims she has been limping and she feels that her right lower leg is shorter than the left with prominence of the right hip. This was a severe pain of the right hip. She has been diagnosed with osteopenia in the past, but no fracture. No history of fall.  Patient has No headache, No chest pain, No abdominal pain - No Nausea, No new weakness tingling or numbness, No Cough - SOB.  ALLERGIES: Allergies  Allergen Reactions  . Antihistamines, Diphenhydramine-Type Palpitations    PAST MEDICAL HISTORY: Past Medical History  Diagnosis Date  . Arthritis     cervical,feet  . Depression   . Migraine   . GERD (gastroesophageal reflux disease) 12/2006    esophageal stricture  . Vitamin D deficiency   . MVP (mitral valve prolapse)   . Spondylolisthesis     L4-5  . RLS (restless legs syndrome)   . Anxiety   . Interstitial cystitis   . IBS (irritable bowel syndrome)   . Insomnia   . DVT (deep vein thrombosis) in pregnancy 1989    postpartum    MEDICATIONS AT HOME: Prior to Admission medications   Medication Sig Start Date End Date Taking? Authorizing Provider  cholecalciferol (VITAMIN D) 1000 UNITS tablet Take 1,000 Units by mouth every morning.   Yes Historical Provider, MD  fish oil-omega-3 fatty acids 1000 MG capsule Take 2 g by mouth every morning.    Yes Historical Provider, MD  Multiple Vitamin (MULTIVITAMIN) capsule Take 1 capsule by  mouth daily.     Yes Historical Provider, MD  naproxen sodium (ANAPROX) 220 MG tablet Take 220 mg by mouth 2 (two) times daily with a meal.   Yes Historical Provider, MD  sertraline (ZOLOFT) 100 MG tablet Take 0.5 tablets (50 mg total) by mouth daily. Pt takes 1/2 tab 11/22/13  Yes Jeanann Lewandowsky, MD  traZODone (DESYREL) 50 MG tablet Take 1 tablet (50 mg total) by mouth at bedtime as needed for sleep. For insomnia. 11/22/13  Yes Jeanann Lewandowsky, MD  baclofen (LIORESAL) 10 MG tablet Take 0.5 tablets (5 mg total) by mouth 4 (four) times daily. For spasticity. 08/23/13   Ripudeep Jenna Luo, MD  polyethylene glycol (MIRALAX / GLYCOLAX) packet Take 17 g by mouth 2 (two) times daily. For constipation 03/10/13   Evlyn Kanner Love, PA-C  terbinafine (EQ ATHLETES FOOT, TERBINAFINE,) 1 % cream Apply topically 2 (two) times daily. 08/23/13   Ripudeep Jenna Luo, MD     Objective:   Filed Vitals:   11/22/13 0916  BP: 107/71  Pulse: 84  Temp: 98.4 F (36.9 C)  TempSrc: Oral  Resp: 16  Height: 5\' 7"  (1.702 m)  Weight: 143 lb (64.864 kg)  SpO2: 99%    Exam General appearance : Awake, alert, not in any distress. Speech Clear. Not toxic looking HEENT: Atraumatic and Normocephalic, pupils equally reactive to light and accomodation Neck: supple, no  JVD. No cervical lymphadenopathy.  Chest:Good air entry bilaterally, no added sounds  CVS: S1 S2 regular, no murmurs.  Abdomen: Bowel sounds present, Non tender and not distended with no gaurding, rigidity or rebound. Extremities: B/L Lower Ext shows no edema, both legs are warm to touch Neurology: Awake alert, and oriented X 3, CN II-XII intact, Non focal Skin:No Rash Wounds:N/A   Data Review   CBC No results found for this basename: WBC, HGB, HCT, PLT, MCV, MCH, MCHC, RDW, NEUTRABS, LYMPHSABS, MONOABS, EOSABS, BASOSABS, BANDABS, BANDSABD,  in the last 168 hours  Chemistries   No results found for this basename: NA, K, CL, CO2, GLUCOSE, BUN, CREATININE,  GFRCGP, CALCIUM, MG, AST, ALT, ALKPHOS, BILITOT,  in the last 168 hours ------------------------------------------------------------------------------------------------------------------ No results found for this basename: HGBA1C,  in the last 72 hours ------------------------------------------------------------------------------------------------------------------ No results found for this basename: CHOL, HDL, LDLCALC, TRIG, CHOLHDL, LDLDIRECT,  in the last 72 hours ------------------------------------------------------------------------------------------------------------------ No results found for this basename: TSH, T4TOTAL, FREET3, T3FREE, THYROIDAB,  in the last 72 hours ------------------------------------------------------------------------------------------------------------------ No results found for this basename: VITAMINB12, FOLATE, FERRITIN, TIBC, IRON, RETICCTPCT,  in the last 72 hours  Coagulation profile  No results found for this basename: INR, PROTIME,  in the last 168 hours    Assessment & Plan   1. Idiopathic transverse myelitis Stable  2. Anxiety Patient counseled extensively Continue medications as prescribed  3. Depression (emotion)  - CMP and Liver - CBC with Differential - Urinalysis, Complete - Vit D  25 hydroxy (rtn osteoporosis monitoring) - traZODone (DESYREL) 50 MG tablet; Take 1 tablet (50 mg total) by mouth at bedtime as needed for sleep. For insomnia.  Dispense: 90 tablet; Refill: 3 - sertraline (ZOLOFT) 100 MG tablet; Take 0.5 tablets (50 mg total) by mouth daily. Pt takes 1/2 tab  Dispense: 30 tablet; Refill: 3  4. Limping  - DG Hip Complete Right; Future - Ambulatory referral to Orthopedic Surgery  5. Right hip pain  - DG Hip Complete Right; Future - Ambulatory referral to Orthopedic Surgery   Follow up in 3 months or when necessary   The patient was given clear instructions to go to ER or return to medical center if symptoms don't  improve, worsen or new problems develop. The patient verbalized understanding. The patient was told to call to get lab results if they haven't heard anything in the next week.    Jeanann Lewandowsky, MD, MHA, FACP, FAAP [facility] [place], [place] [phone]   11/22/2013, 9:54 AM

## 2013-11-22 NOTE — Progress Notes (Signed)
Pt here f/u medication refills since last visit 08/23/13 C/o right hip discomfort-requesting xray Need script Zoloft 100mg  tab-states she take 1/2 tab

## 2013-11-23 LAB — URINALYSIS, COMPLETE
Bacteria, UA: NONE SEEN
Bilirubin Urine: NEGATIVE
Casts: NONE SEEN
Crystals: NONE SEEN
Glucose, UA: NEGATIVE mg/dL
Hgb urine dipstick: NEGATIVE
Ketones, ur: NEGATIVE mg/dL
Nitrite: NEGATIVE
Specific Gravity, Urine: 1.006 (ref 1.005–1.030)
pH: 6 (ref 5.0–8.0)

## 2013-11-23 LAB — VITAMIN D 25 HYDROXY (VIT D DEFICIENCY, FRACTURES): Vit D, 25-Hydroxy: 55 ng/mL (ref 30–89)

## 2013-11-25 ENCOUNTER — Telehealth: Payer: Self-pay | Admitting: *Deleted

## 2013-11-25 NOTE — Telephone Encounter (Signed)
I spoke to the pt and told her that her hip x ray came back normal.

## 2013-11-25 NOTE — Telephone Encounter (Signed)
Message copied by Raynelle Chary on Fri Nov 25, 2013  4:21 PM ------      Message from: Jeanann Lewandowsky E      Created: Thu Nov 24, 2013  3:53 PM       Please inform patient that her hip x-ray is normal ------

## 2013-11-30 ENCOUNTER — Telehealth: Payer: Self-pay | Admitting: *Deleted

## 2013-11-30 NOTE — Telephone Encounter (Signed)
Message copied by Raynelle Chary on Wed Nov 30, 2013  9:37 AM ------      Message from: Quentin Angst      Created: Tue Nov 29, 2013 12:39 PM       Please inform patient that her recent laboratory tests are all normal including her vitamin D level ------

## 2013-11-30 NOTE — Telephone Encounter (Signed)
I spoke to the pt and informed her that her labs were normal. 

## 2014-01-04 ENCOUNTER — Encounter: Payer: Self-pay | Admitting: Internal Medicine

## 2014-01-04 ENCOUNTER — Ambulatory Visit: Payer: Medicaid Other | Attending: Internal Medicine | Admitting: Internal Medicine

## 2014-01-04 VITALS — BP 93/62 | HR 76 | Temp 99.1°F | Resp 16 | Ht 67.0 in | Wt 134.0 lb

## 2014-01-04 DIAGNOSIS — J209 Acute bronchitis, unspecified: Secondary | ICD-10-CM

## 2014-01-04 DIAGNOSIS — M25559 Pain in unspecified hip: Secondary | ICD-10-CM

## 2014-01-04 MED ORDER — GUAIFENESIN ER 600 MG PO TB12
600.0000 mg | ORAL_TABLET | Freq: Two times a day (BID) | ORAL | Status: DC
Start: 2014-01-04 — End: 2014-01-09

## 2014-01-04 MED ORDER — HYDROCOD POLST-CHLORPHEN POLST 10-8 MG/5ML PO LQCR: 5.0000 mL | Freq: Two times a day (BID) | ORAL | Status: DC

## 2014-01-04 MED ORDER — BACLOFEN 10 MG PO TABS: 5.0000 mg | ORAL_TABLET | Freq: Four times a day (QID) | ORAL | Status: DC

## 2014-01-04 NOTE — Progress Notes (Signed)
Pt is here following up on her anxiety. Since last Thursday pt has been having flu like symptoms.

## 2014-01-04 NOTE — Progress Notes (Signed)
Patient ID: Gail Gilbert, female   DOB: [birth year], 63 y.o.   MRN: [id number]   CC:  HPI: 63 year old female here for evaluation of flulike symptoms. She also has anxiety and has been taking 50 mg of Zoloft instead of 100 mg. She wants to return to work tomorrow morning. She wants a letter to return to work. She has a history of transverse myelitis and states that she has had cramping in her right lower leg. She also is requesting referral to see a gynecologist She wants a referral to see a neurologist for history of transverse myelitis   Allergies  Allergen Reactions  . Antihistamines, Diphenhydramine-Type Palpitations   Past Medical History  Diagnosis Date  . Arthritis     cervical,feet  . Depression   . Migraine   . GERD (gastroesophageal reflux disease) 12/2006    esophageal stricture  . Vitamin D deficiency   . MVP (mitral valve prolapse)   . Spondylolisthesis     L4-5  . RLS (restless legs syndrome)   . Anxiety   . Interstitial cystitis   . IBS (irritable bowel syndrome)   . Insomnia   . DVT (deep vein thrombosis) in pregnancy 1989    postpartum   Current Outpatient Prescriptions on File Prior to Visit  Medication Sig Dispense Refill  . fish oil-omega-3 fatty acids 1000 MG capsule Take 2 g by mouth every morning.       . Multiple Vitamin (MULTIVITAMIN) capsule Take 1 capsule by mouth daily.        . naproxen sodium (ANAPROX) 220 MG tablet Take 220 mg by mouth 2 (two) times daily with a meal.      . sertraline (ZOLOFT) 100 MG tablet Take 0.5 tablets (50 mg total) by mouth daily. Pt takes 1/2 tab  30 tablet  3  . traZODone (DESYREL) 50 MG tablet Take 1 tablet (50 mg total) by mouth at bedtime as needed for sleep. For insomnia.  90 tablet  3  . cholecalciferol (VITAMIN D) 1000 UNITS tablet Take 1,000 Units by mouth every morning.      . polyethylene glycol (MIRALAX / GLYCOLAX) packet Take 17 g by mouth 2 (two) times daily. For constipation  14 each    . terbinafine (EQ  ATHLETES FOOT, TERBINAFINE,) 1 % cream Apply topically 2 (two) times daily.  30 g  3   No current facility-administered medications on file prior to visit.   Family History  Problem Relation Age of Onset  . Arthritis Mother     spinal stenosis  . Cataracts Mother   . Heart disease Mother     pacemaker  . Hypertension Father   . Heart disease Father     Quarry manager  . Diabetes Father   . Diabetes Sister    History   Social History  . Marital Status: Married    Spouse Name: Gershon Mussel    Number of Children: 1  . Years of Education: N/A   Occupational History  . unemployed     former Secretary/administrator   Social History Main Topics  . Smoking status: Never Smoker   . Smokeless tobacco: Never Used  . Alcohol Use: No     Comment: occasionally  . Drug Use: No  . Sexual Activity: Yes    Partners: Male    Birth Control/ Protection: Post-menopausal   Other Topics Concern  . Not on file   Social History Narrative   Divorced.   Lives alone.   Adult children  live in town.  Her parents are in [place]  Constitutional: Negative for fever, chills, diaphoresis, activity change, appetite change and fatigue.  HENT: Negative for ear pain, nosebleeds, congestion, facial swelling, rhinorrhea, neck pain, neck stiffness and ear discharge.   Eyes: Negative for pain, discharge, redness, itching and visual disturbance.  Respiratory: Negative for cough, choking, chest tightness, shortness of breath, wheezing and stridor.   Cardiovascular: Negative for chest pain, palpitations and leg swelling.  Gastrointestinal: Negative for abdominal distention.  Genitourinary: Negative for dysuria, urgency, frequency, hematuria, flank pain, decreased urine volume, difficulty urinating and dyspareunia.  Musculoskeletal: Negative for back pain, joint swelling, arthralgias and gait problem.  Neurological: Negative for dizziness, tremors, seizures, syncope, facial asymmetry, speech  difficulty, weakness, light-headedness, numbness and headaches.  Hematological: Negative for adenopathy. Does not bruise/bleed easily.  Psychiatric/Behavioral: Negative for hallucinations, behavioral problems, confusion, dysphoric mood, decreased concentration and agitation.    Objective:   Filed Vitals:   01/04/14 0949  BP: 93/62  Pulse: 76  Temp: 99.1 F (37.3 C)  Resp: 16    Physical Exam  Constitutional: Appears well-developed and well-nourished. No distress.  HENT: Normocephalic. External right and left ear normal. Oropharynx is clear and moist.  Eyes: Conjunctivae and EOM are normal. PERRLA, no scleral icterus.  Neck: Normal ROM. Neck supple. No JVD. No tracheal deviation. No thyromegaly.  CVS: RRR, S1/S2 +, no murmurs, no gallops, no carotid bruit.  Pulmonary: Effort and breath sounds normal, no stridor, rhonchi, wheezes, rales.  Abdominal: Soft. BS +,  no distension, tenderness, rebound or guarding.  Musculoskeletal: Normal range of motion. No edema and no tenderness.  Lymphadenopathy: No lymphadenopathy noted, cervical, inguinal. Neuro: Alert. Normal reflexes, muscle tone coordination. No cranial nerve deficit. Skin: Skin is warm and dry. No rash noted. Not diaphoretic. No erythema. No pallor.  Psychiatric: Normal mood and affect. Behavior, judgment, thought content normal.   Lab Results  Component Value Date   WBC 5.0 11/22/2013   HGB 12.5 11/22/2013   HCT 36.4 11/22/2013   MCV 90.3 11/22/2013   PLT 302 11/22/2013   Lab Results  Component Value Date   CREATININE 0.67 11/22/2013   BUN 14 11/22/2013   NA 140 11/22/2013   K 4.3 11/22/2013   CL 103 11/22/2013   CO2 30 11/22/2013    No results found for this basename: HGBA1C   Lipid Panel  No results found for this basename: chol, trig, hdl, cholhdl, vldl, ldlcalc       Assessment and plan:   Patient Active Problem List   Diagnosis Date Noted  . Depression (emotion) 11/22/2013  . Limping 11/22/2013  . Right hip  pain 11/22/2013  . Onychomycosis of toenail 08/23/2013  . UTI (urinary tract infection) due to E. coli 03/04/2013  . Idiopathic transverse myelitis 03/01/2013  . Urinary retention 02/27/2013  . Myelitis 02/21/2013  . Leg weakness, bilateral 02/20/2013  . Anxiety 08/05/2012  . Spondylolisthesis   . RLS (restless legs syndrome)    History of transverse myelitis Refill baclofen Neurology referral  Acute bronchitis Refusing antibiotic Mucinex DM and Tussionex syrup  Anxiety Refusing to go to psychiatry Increase Zoloft 100 mg  Followup in 3 months      The patient was given clear instructions to go to ER or return to medical center if symptoms don't improve, worsen or new problems develop. The patient verbalized understanding. The patient was told to call to get any  lab results if not heard anything in the next week.

## 2014-01-05 ENCOUNTER — Telehealth: Payer: Self-pay | Admitting: Internal Medicine

## 2014-01-05 NOTE — Telephone Encounter (Signed)
Pharmacy calling because script for chlorpheniramine-HYDROcodone ([place]) 10-8 MG/5ML Gaspar Skeeters has a $81 copay which is out of pt's price range. Pharmacist says there are alternative meds that are less expensive but need a script from Dr.  Please f/u with [facility].

## 2014-01-06 NOTE — Telephone Encounter (Signed)
Patient would like new script sent to her pharmacy Can not afford  tussionex

## 2014-01-09 ENCOUNTER — Encounter: Payer: Self-pay | Admitting: Neurology

## 2014-01-09 ENCOUNTER — Ambulatory Visit (INDEPENDENT_AMBULATORY_CARE_PROVIDER_SITE_OTHER): Payer: Medicaid Other | Admitting: Neurology

## 2014-01-09 VITALS — BP 118/65 | HR 90 | Ht 68.0 in | Wt 134.0 lb

## 2014-01-09 DIAGNOSIS — R269 Unspecified abnormalities of gait and mobility: Secondary | ICD-10-CM

## 2014-01-09 DIAGNOSIS — H547 Unspecified visual loss: Secondary | ICD-10-CM

## 2014-01-09 DIAGNOSIS — M25559 Pain in unspecified hip: Secondary | ICD-10-CM

## 2014-01-09 DIAGNOSIS — M25551 Pain in right hip: Secondary | ICD-10-CM

## 2014-01-09 DIAGNOSIS — R2681 Unsteadiness on feet: Secondary | ICD-10-CM

## 2014-01-09 NOTE — Progress Notes (Signed)
[facility]    Provider:  Dr Janann Colonel Referring Provider: Angelica Chessman, MD Primary Care Physician:  Angelica Chessman, MD  CC:  R hip pain  HPI:  Gail Gilbert is a 63 y.o. female here as a referral from Dr. Doreene Burke for right hip pain and blurry vision  In March 2014, initially had pins and needles type sensation in her feet, rapdily (over hours) progressed to point that she could not walk. Felt weakness in her proximal LE muscles. Lost feeling in her lower extremities bilaterally. Was in the hospital for around 3 weeks. Had extensive workup including LP, MRI, lab work (including HIV negative, RPR nonreactive, B12 normal, TSH normal, copper normal). Has continued to have difficulty walking, feels unsteady on her feet. Continues to have difficulty with her right hip, feels weakness in her right hip. Has had X-ray of R hip, told there is no degenerative changes. She notes that when she walks she has the sensation of "bone rubbing on bone" in the right hip. No radiating pains.   Also notes recent onset of blurry vision, feels it is involving both eyes. No diplopia. Started 1-2 weeks ago, started around the time she had the flu. Has history of brief episodes (days) of blurry vision. No painful vision changes or eye movements. Last vision with eye doctor was in 2013, scheduled to see them again in February.   Main concern today is hip pain and blurry vision.   Recent hospital stay notes, including imaging pictures were reviewed.   Review of Systems: Out of a complete 14 system review, the patient complains of only the following symptoms, and all other reviewed systems are negative. Positive for right hip soreness blurred vision cough aching muscles anxiety restless legs  History   Social History  . Marital Status: Married    Spouse Name: Gershon Mussel    Number of Children: 1  . Years of Education: college   Occupational History  . unemployed     former Secretary/administrator    Social History Main Topics  . Smoking status: Never Smoker   . Smokeless tobacco: Never Used  . Alcohol Use: 0.0 oz/week     Comment: occasionally  . Drug Use: No  . Sexual Activity: Yes    Partners: Male    Birth Control/ Protection: Post-menopausal   Other Topics Concern  . Not on file   Social History Narrative   Divorced.   Lives alone.   Adult children live in town.  Her parents are in [place]    Patient is right handed   Education level is some college   Caffeine consumption is 1 cup daily             Family History  Problem Relation Age of Onset  . Arthritis Mother     spinal stenosis  . Cataracts Mother   . Heart disease Mother     pacemaker  . Hypertension Father   . Heart disease Father     Quarry manager  . Diabetes Father   . Diabetes Sister     Past Medical History  Diagnosis Date  . Arthritis     cervical,feet  . Depression   . Migraine   . GERD (gastroesophageal reflux disease) 12/2006    esophageal stricture  . Vitamin D deficiency   . MVP (mitral valve prolapse)   . Spondylolisthesis     L4-5  . RLS (restless legs syndrome)   . Anxiety   . Interstitial cystitis   .  IBS (irritable bowel syndrome)   . Insomnia   . DVT (deep vein thrombosis) in pregnancy 1989    postpartum    Past Surgical History  Procedure Laterality Date  . Colonoscopy    . Bunionectomy  09,05    both feet  . Upper gastrointestinal endoscopy    . Ear examination under anesthesia      to correct ringing in ear at [facility]  . Lumbar aspiration and drainage      cyst  . Hammer toe surgery  10/29/2011    Procedure: HAMMER TOE CORRECTION;  Surgeon: Kerin Salen;  Location: [place];  Service: Orthopedics;  Laterality: Left;  Left 2nd toe duvries arthroplasty  . Decompressive lumbar laminectomy level 4  1999    Current Outpatient Prescriptions  Medication Sig Dispense Refill  . fish oil-omega-3 fatty acids 1000 MG capsule Take 2 g by  mouth every morning.       . Multiple Vitamin (MULTIVITAMIN) capsule Take 1 capsule by mouth daily.        . naproxen sodium (ANAPROX) 220 MG tablet Take 220 mg by mouth 2 (two) times daily with a meal.      . sertraline (ZOLOFT) 100 MG tablet Take 0.5 tablets (50 mg total) by mouth daily. Pt takes 1/2 tab  30 tablet  3  . traZODone (DESYREL) 50 MG tablet Take 1 tablet (50 mg total) by mouth at bedtime as needed for sleep. For insomnia.  90 tablet  3   No current facility-administered medications for this visit.    Allergies as of 01/09/2014 - Review Complete 01/09/2014  Allergen Reaction Noted  . Antihistamines, diphenhydramine-type Palpitations 02/19/2013    Vitals: BP 118/65  Pulse 90  Ht 5\' 8"  (1.727 m)  Wt 134 lb (60.782 kg)  BMI 20.38 kg/m2 Last Weight:  Wt Readings from Last 1 Encounters:  01/09/14 134 lb (60.782 kg)   Last Height:   Ht Readings from Last 1 Encounters:  01/09/14 5\' 8"  (1.727 m)     Physical exam: Exam: Gen: NAD, conversant Eyes: anicteric sclerae, moist conjunctivae HENT: Atraumatic, oropharynx clear Neck: Trachea midline; supple,  Lungs: CTA, no wheezing, rales, rhonic                          CV: RRR, no MRG Abdomen: Soft, non-tender;  Extremities: No peripheral edema  Skin: Normal temperature, no rash,  Psych: Appropriate affect, pleasant  Neuro: MS: AA&Ox3, appropriately interactive, normal affect   Speech: fluent w/o paraphasic error  Memory: good recent and remote recall  CN: PERRL, EOMI no nystagmus, no ptosis, sensation intact to LT V1-V3 bilat, face symmetric, no weakness, hearing grossly intact, palate elevates symmetrically, shoulder shrug 5/5 bilat,  tongue protrudes midline, no fasiculations noted.  Motor: normal bulk, minimally increased tone bilateral LE Strength: 5/5  In all extremities with exception of 5-/5 in R hip flexor  Coord: rapid alternating and point-to-point (FNF, HTS) movements intact.  Reflexes: brisk  throughout, right equivocal plantar response, left downgoing  Sens: decreased LT in R C8 distribution, diminished bilat LE to LT, PP, vibration   Assessment:  After physical and neurologic examination, review of laboratory studies, imaging, neurophysiology testing and pre-existing records, assessment will be reviewed on the problem list.  Plan:  Treatment plan and additional workup will be reviewed under Problem List.  1)Weakness/spasticity 2)Blurry vision 3)History of transverse myelitis  63y/o woman with history of acute onset of bilateral LE  weakness and gait instability with MRI cervical spine showing involvement of bilateral corticospinal tracts and posterior column. Unclear etiology of symptoms, had extensive in patient workup. Main concern today is right hip pain and blurry vision. Unclear causes of new onset blurry vision, last saw eye doctor around 1 year ago. Will check VER, can consider repeat brain MRI in the future. Patient scheduled to follow up with eye doctor. Suspect hip pain is likely MSK in nature, referral for PT placed. Follow up in 3 months.    Jim Like, DO  [facility] [address] [place], [place] [address]  Phone [phone] Fax [phone]

## 2014-01-09 NOTE — Patient Instructions (Signed)
Overall you are doing fairly well but I do want to suggest a few things today:   As far as diagnostic testing:  1)Please schedule a visual evoked response test when you check out  I placed a referral for physical therapy, someone will call you to schedule this  I would like to see you back in 3 moths, sooner if we need to. Please call us with any interim questions, concerns, problems, updates or refill requests.   My clinical assistant and will answer any of your questions and relay your messages to me and also relay most of my messages to you.   Our phone number is [phone]. We also have an after hours call service for urgent matters and there is a physician on-call for urgent questions. For any emergencies you know to call 911 or go to the nearest emergency room

## 2014-01-19 ENCOUNTER — Ambulatory Visit (INDEPENDENT_AMBULATORY_CARE_PROVIDER_SITE_OTHER): Payer: Medicaid Other

## 2014-01-19 ENCOUNTER — Telehealth: Payer: Self-pay | Admitting: Neurology

## 2014-01-19 DIAGNOSIS — H53459 Other localized visual field defect, unspecified eye: Secondary | ICD-10-CM

## 2014-01-19 DIAGNOSIS — H547 Unspecified visual loss: Secondary | ICD-10-CM

## 2014-01-19 NOTE — Procedures (Signed)
    History:   Gail Gilbert is a 63 year old patient with a history of paresthesias and numbness affecting the lower extremities. MRI evaluation of the brain and cervical spinal cord reveals lesions in the cord and brain. The patient is being evaluated for possible demyelinating disease.  Description: The visual evoked response test was performed today using 32 x 32 check sizes. The absolute latencies for the N1 and the P100 wave forms were within normal limits bilaterally. The amplitudes for the P100 wave forms were also within normal limits bilaterally. The visual acuity was 20/30 OD and 20/30 OS corrected.  Impression:  The visual evoked response test above was within normal limits bilaterally. No evidence of conduction slowing was seen within the anterior visual pathways on either side on today's evaluation.

## 2014-01-19 NOTE — Telephone Encounter (Signed)
Called patient and informed that referral had been faxed to [facility] 01/19/14, gave her their number for contact, she verbalized understanding.

## 2014-01-19 NOTE — Telephone Encounter (Signed)
Patient has not heard about setting up physical therapy - Please call to advise.

## 2014-01-20 ENCOUNTER — Telehealth: Payer: Self-pay | Admitting: Neurology

## 2014-01-20 NOTE — Telephone Encounter (Signed)
Spoke with patient, informed her that visual evoked test was normal and to follow up with regular eye doctor, which she states that she went on 01/19/14, outcomes is that she is in the beginning stages of cataracts.

## 2014-01-20 NOTE — Telephone Encounter (Signed)
Message copied by Emeline General on Fri Jan 20, 2014 10:20 AM ------      Message from: Drema Dallas      Created: Thu Jan 19, 2014  4:33 PM       Please let her know her recent test was normal. She should follow up with her eye doctor prior to following up with [place]. Thanks. ------

## 2014-02-16 ENCOUNTER — Ambulatory Visit: Payer: Medicaid Other | Attending: Neurology | Admitting: Physical Therapy

## 2014-02-20 ENCOUNTER — Ambulatory Visit: Payer: Medicaid Other | Admitting: Internal Medicine

## 2014-04-04 ENCOUNTER — Encounter: Payer: Self-pay | Admitting: Internal Medicine

## 2014-04-04 ENCOUNTER — Ambulatory Visit: Payer: Medicaid Other | Attending: Internal Medicine | Admitting: Internal Medicine

## 2014-04-04 VITALS — BP 114/71 | HR 70 | Temp 98.5°F | Resp 16 | Ht 68.0 in | Wt 137.0 lb

## 2014-04-04 DIAGNOSIS — F32A Depression, unspecified: Secondary | ICD-10-CM

## 2014-04-04 DIAGNOSIS — Z09 Encounter for follow-up examination after completed treatment for conditions other than malignant neoplasm: Secondary | ICD-10-CM | POA: Insufficient documentation

## 2014-04-04 DIAGNOSIS — F3289 Other specified depressive episodes: Secondary | ICD-10-CM

## 2014-04-04 DIAGNOSIS — F329 Major depressive disorder, single episode, unspecified: Secondary | ICD-10-CM | POA: Insufficient documentation

## 2014-04-04 DIAGNOSIS — F411 Generalized anxiety disorder: Secondary | ICD-10-CM | POA: Insufficient documentation

## 2014-04-04 DIAGNOSIS — F419 Anxiety disorder, unspecified: Secondary | ICD-10-CM

## 2014-04-04 MED ORDER — SERTRALINE HCL 100 MG PO TABS: 50.0000 mg | ORAL_TABLET | Freq: Every day | ORAL | Status: DC

## 2014-04-04 MED ORDER — TRAZODONE HCL 50 MG PO TABS: 50.0000 mg | ORAL_TABLET | Freq: Every evening | ORAL | Status: DC | PRN

## 2014-04-04 NOTE — Progress Notes (Signed)
Pt is here following up on her anxiety disorder and depression.

## 2014-04-04 NOTE — Progress Notes (Signed)
Patient ID: Gail Gilbert, female   DOB: Jun 25, 1951, 63 y.o.   MRN: [id number]   Gail Gilbert, is a 63 y.o. female  [id number]  [id number]  DOB - 14-Apr-1951  Chief Complaint  Patient presents with  . Follow-up        Subjective:   Gail Gilbert is a 63 y.o. female here today for a follow up visit. Patient came in for refill of her medications, she is on trazodone and sertraline for depression. She claims she is doing well on medications, she has no suicidal ideation or thoughts. She has no complaint today. Patient has No headache, No chest pain, No abdominal pain - No Nausea, No new weakness tingling or numbness, No Cough - SOB.  No problems updated.  ALLERGIES: Allergies  Allergen Reactions  . Antihistamines, Diphenhydramine-Type Palpitations    PAST MEDICAL HISTORY: Past Medical History  Diagnosis Date  . Arthritis     cervical,feet  . Depression   . Migraine   . GERD (gastroesophageal reflux disease) 12/2006    esophageal stricture  . Vitamin D deficiency   . MVP (mitral valve prolapse)   . Spondylolisthesis     L4-5  . RLS (restless legs syndrome)   . Anxiety   . Interstitial cystitis   . IBS (irritable bowel syndrome)   . Insomnia   . DVT (deep vein thrombosis) in pregnancy 1989    postpartum    MEDICATIONS AT HOME: Prior to Admission medications   Medication Sig Start Date End Date Taking? Authorizing Provider  fish oil-omega-3 fatty acids 1000 MG capsule Take 2 g by mouth every morning.    Yes Historical Provider, MD  Multiple Vitamin (MULTIVITAMIN) capsule Take 1 capsule by mouth daily.     Yes Historical Provider, MD  naproxen sodium (ANAPROX) 220 MG tablet Take 220 mg by mouth 2 (two) times daily with a meal.   Yes Historical Provider, MD  sertraline (ZOLOFT) 100 MG tablet Take 0.5 tablets (50 mg total) by mouth daily. Pt takes 1/2 tab 04/04/14  Yes Angelica Chessman, MD  traZODone (DESYREL) 50 MG tablet Take 1 tablet (50 mg total) by mouth at  bedtime as needed for sleep. For insomnia. 04/04/14  Yes Angelica Chessman, MD     Objective:   Filed Vitals:   04/04/14 1055  BP: 114/71  Pulse: 70  Temp: 98.5 F (36.9 C)  TempSrc: Oral  Resp: 16  Height: 5\' 8"  (1.727 m)  Weight: 137 lb (62.143 kg)  SpO2: 96%    Exam General appearance : Awake, alert, not in any distress. Speech Clear. Not toxic looking HEENT: Atraumatic and Normocephalic, pupils equally reactive to light and accomodation Neck: supple, no JVD. No cervical lymphadenopathy.  Chest:Good air entry bilaterally, no added sounds  CVS: S1 S2 regular, no murmurs.  Abdomen: Bowel sounds present, Non tender and not distended with no gaurding, rigidity or rebound. Extremities: B/L Lower Ext shows no edema, both legs are warm to touch Neurology: Awake alert, and oriented X 3, CN II-XII intact, Non focal Skin:No Rash Wounds:N/A  Data Review No results found for this basename: HGBA1C     Assessment & Plan   1. Depression (emotion)  - traZODone (DESYREL) 50 MG tablet; Take 1 tablet (50 mg total) by mouth at bedtime as needed for sleep. For insomnia.  Dispense: 30 tablet; Refill: 6 - sertraline (ZOLOFT) 100 MG tablet; Take 0.5 tablets (50 mg total) by mouth daily. Pt takes 1/2 tab  Dispense: 30 tablet; Refill: 6  2. Anxiety As above  Patient was counseled extensively on nutrition and exercise  Return in about 6 months (around 10/04/2014), or if symptoms worsen or fail to improve, for Generalized Anxiety Disorder.  The patient was given clear instructions to go to ER or return to medical center if symptoms don't improve, worsen or new problems develop. The patient verbalized understanding. The patient was told to call to get lab results if they haven't heard anything in the next week.   This note has been created with Surveyor, quantity. Any transcriptional errors are unintentional.    Angelica Chessman, MD, [place], [place],  [place] [facility] [place], [place]   04/04/2014, 11:51 AM

## 2014-04-11 ENCOUNTER — Ambulatory Visit: Payer: Medicaid Other | Admitting: Neurology

## 2014-05-30 ENCOUNTER — Encounter: Payer: Self-pay | Admitting: Emergency Medicine

## 2014-05-30 MED ORDER — CYCLOBENZAPRINE HCL 10 MG PO TABS: 10.0000 mg | ORAL_TABLET | Freq: Three times a day (TID) | ORAL | Status: DC | PRN

## 2014-05-30 NOTE — Progress Notes (Unsigned)
Patient ID: Gail Gilbert, female   DOB: Oct 15, 1951, 63 y.o.   MRN: [id number] Pt comes in as same day visit with c/o right hip pain with movement. States the pain is more muscular. Pt is taking Alleve for sx's. States she has lost her balance twice in the past 2 weeks without injury. Last xray's negative. No swelling reported.

## 2014-05-30 NOTE — Patient Instructions (Signed)
Take prescribed medications for 2 weeks and return if no improvement for MRI

## 2014-07-06 ENCOUNTER — Other Ambulatory Visit (HOSPITAL_COMMUNITY)
Admission: RE | Admit: 2014-07-06 | Discharge: 2014-07-06 | Disposition: A | Discharge status: Home / Self Care | Payer: Medicaid Other | Source: Ambulatory Visit | Attending: Internal Medicine | Admitting: Internal Medicine

## 2014-07-06 ENCOUNTER — Ambulatory Visit: Payer: Medicaid Other | Attending: Internal Medicine | Admitting: Internal Medicine

## 2014-07-06 ENCOUNTER — Encounter: Payer: Self-pay | Admitting: Internal Medicine

## 2014-07-06 VITALS — BP 120/78 | HR 70 | Temp 98.3°F | Resp 16 | Ht 68.0 in | Wt 140.0 lb

## 2014-07-06 DIAGNOSIS — Z01419 Encounter for gynecological examination (general) (routine) without abnormal findings: Secondary | ICD-10-CM | POA: Diagnosis not present

## 2014-07-06 DIAGNOSIS — Z1151 Encounter for screening for human papillomavirus (HPV): Secondary | ICD-10-CM | POA: Insufficient documentation

## 2014-07-06 DIAGNOSIS — K589 Irritable bowel syndrome without diarrhea: Secondary | ICD-10-CM

## 2014-07-06 DIAGNOSIS — F3289 Other specified depressive episodes: Secondary | ICD-10-CM | POA: Diagnosis not present

## 2014-07-06 DIAGNOSIS — F411 Generalized anxiety disorder: Secondary | ICD-10-CM | POA: Insufficient documentation

## 2014-07-06 DIAGNOSIS — N760 Acute vaginitis: Secondary | ICD-10-CM | POA: Insufficient documentation

## 2014-07-06 DIAGNOSIS — R109 Unspecified abdominal pain: Secondary | ICD-10-CM | POA: Diagnosis not present

## 2014-07-06 DIAGNOSIS — F329 Major depressive disorder, single episode, unspecified: Secondary | ICD-10-CM | POA: Insufficient documentation

## 2014-07-06 DIAGNOSIS — Z0000 Encounter for general adult medical examination without abnormal findings: Secondary | ICD-10-CM | POA: Insufficient documentation

## 2014-07-06 DIAGNOSIS — Z113 Encounter for screening for infections with a predominantly sexual mode of transmission: Secondary | ICD-10-CM | POA: Diagnosis not present

## 2014-07-06 DIAGNOSIS — G373 Acute transverse myelitis in demyelinating disease of central nervous system: Secondary | ICD-10-CM

## 2014-07-06 MED ORDER — DICYCLOMINE HCL 20 MG PO TABS: 20.0000 mg | ORAL_TABLET | Freq: Four times a day (QID) | ORAL | Status: DC

## 2014-07-06 NOTE — Progress Notes (Signed)
Pt is here today for a physical and a Pap smear.

## 2014-07-06 NOTE — Progress Notes (Signed)
Patient ID: Gail Gilbert, female   DOB: Nov 23, 1951, 63 y.o.   MRN: [id number]   Gail Gilbert, is a 63 y.o. female  [id number]:[id number]  [id number]  DOB - 1951-12-04  Chief Complaint  Patient presents with  . Follow-up        Subjective:   Gail Gilbert is a 63 y.o. female here today for a follow up visit. Patient is known to have idiopathic transverse myelitis, major depression, restless leg syndrome, spondylolisthesis, here today for an annual physical examination. She has not had a Pap smear in over 2 years, she has not had mammogram and she is due for colonoscopy. She claims to be up to date on her immunizations. She had been told in the past that she had cystocele, but she had no significant symptoms of urge incontinence. She has no personal history of hypertension or diabetes. She does not smoke cigarette she does not drink alcohol. Patient has No headache, No chest pain, No abdominal pain - No Nausea, No new weakness tingling or numbness, No Cough - SOB.  Problem  Annual Physical Exam    ALLERGIES: Allergies  Allergen Reactions  . Antihistamines, Diphenhydramine-Type Palpitations    PAST MEDICAL HISTORY: Past Medical History  Diagnosis Date  . Arthritis     cervical,feet  . Depression   . Migraine   . GERD (gastroesophageal reflux disease) 12/2006    esophageal stricture  . Vitamin D deficiency   . MVP (mitral valve prolapse)   . Spondylolisthesis     L4-5  . RLS (restless legs syndrome)   . Anxiety   . Interstitial cystitis   . IBS (irritable bowel syndrome)   . Insomnia   . DVT (deep vein thrombosis) in pregnancy 1989    postpartum    MEDICATIONS AT HOME: Prior to Admission medications   Medication Sig Start Date End Date Taking? Authorizing Provider  fish oil-omega-3 fatty acids 1000 MG capsule Take 2 g by mouth every morning.    Yes Historical Provider, MD  Multiple Vitamin (MULTIVITAMIN) capsule Take 1 capsule by mouth daily.     Yes Historical Provider, MD   naproxen sodium (ANAPROX) 220 MG tablet Take 220 mg by mouth 2 (two) times daily with a meal.   Yes Historical Provider, MD  sertraline (ZOLOFT) 100 MG tablet Take 0.5 tablets (50 mg total) by mouth daily. Pt takes 1/2 tab 04/04/14  Yes Angelica Chessman, MD  traZODone (DESYREL) 50 MG tablet Take 1 tablet (50 mg total) by mouth at bedtime as needed for sleep. For insomnia. 04/04/14  Yes Angelica Chessman, MD  cyclobenzaprine (FLEXERIL) 10 MG tablet Take 1 tablet (10 mg total) by mouth 3 (three) times daily as needed for muscle spasms. 05/30/14   Angelica Chessman, MD  dicyclomine (BENTYL) 20 MG tablet Take 1 tablet (20 mg total) by mouth every 6 (six) hours. 07/06/14   Angelica Chessman, MD     Objective:   Filed Vitals:   07/06/14 1149  BP: 120/78  Pulse: 70  Temp: 98.3 F (36.8 C)  TempSrc: Oral  Resp: 16  Height: 5\' 8"  (1.727 m)  Weight: 140 lb (63.504 kg)  SpO2: 96%    Exam General appearance : Awake, alert, not in any distress. Speech Clear. Not toxic looking HEENT: Atraumatic and Normocephalic, pupils equally reactive to light and accomodation Neck: supple, no JVD. No cervical lymphadenopathy.  Chest:Good air entry bilaterally, no added sounds  CVS: S1 S2 regular, no murmurs.  Abdomen: Bowel sounds present, Non tender  and not distended with no gaurding, rigidity or rebound. Extremities: B/L Lower Ext shows no edema, both legs are warm to touch Neurology: Awake alert, and oriented X 3, CN II-XII intact, Non focal Skin:No Rash Wounds:N/A Pelvic examination: Normal female external genitalia, central cervix, cystocele, negative cervical motion tenderness, Pap smear performed with minimal bleeding to contact. Data Review No results found for this basename: HGBA1C     Assessment & Plan   1. Annual physical exam  - Cytology - PAP - Cervicovaginal ancillary only - MM Digital Screening; Future - HM COLONOSCOPY - Ambulatory referral to Gastroenterology  2. Idiopathic  transverse myelitis  - Ambulatory referral to Physical Therapy  3. Irritable bowel syndrome  - dicyclomine (BENTYL) 20 MG tablet; Take 1 tablet (20 mg total) by mouth every 6 (six) hours.  Dispense: 30 tablet; Refill: 3  Patient was counseled extensively about nutrition and exercise  Return in about 6 months (around 01/06/2015), or if symptoms worsen or fail to improve, for Generalized Anxiety Disorder, Abdominal Pain.  The patient was given clear instructions to go to ER or return to medical center if symptoms don't improve, worsen or new problems develop. The patient verbalized understanding. The patient was told to call to get lab results if they haven't heard anything in the next week.   This note has been created with Surveyor, quantity. Any transcriptional errors are unintentional.    Angelica Chessman, MD, [place], [place] [place] [facility] [place], [place]   07/06/2014, 12:39 PM

## 2014-07-10 LAB — CYTOLOGY - PAP

## 2014-07-11 ENCOUNTER — Encounter: Payer: Self-pay | Admitting: Internal Medicine

## 2014-07-18 ENCOUNTER — Telehealth: Payer: Self-pay | Admitting: *Deleted

## 2014-07-18 NOTE — Telephone Encounter (Signed)
Message copied by Joan Mayans on Tue Jul 18, 2014  9:04 AM ------      Message from: Tresa Garter      Created: Fri Jul 14, 2014  4:16 PM       Please inform patient that her Pap smear result is negative for malignancy with no evidence of infection. ------

## 2014-07-18 NOTE — Telephone Encounter (Signed)
Pt is aware of her Pap results.

## 2014-07-19 ENCOUNTER — Ambulatory Visit (HOSPITAL_COMMUNITY)
Admission: RE | Admit: 2014-07-19 | Discharge: 2014-07-19 | Disposition: A | Discharge status: Home / Self Care | Payer: Medicaid Other | Source: Ambulatory Visit | Attending: Internal Medicine | Admitting: Internal Medicine

## 2014-07-19 DIAGNOSIS — Z1231 Encounter for screening mammogram for malignant neoplasm of breast: Secondary | ICD-10-CM | POA: Insufficient documentation

## 2014-07-19 DIAGNOSIS — Z0000 Encounter for general adult medical examination without abnormal findings: Secondary | ICD-10-CM

## 2014-07-25 ENCOUNTER — Telehealth: Payer: Self-pay | Admitting: *Deleted

## 2014-07-25 NOTE — Telephone Encounter (Signed)
Pt is aware of her MM results. 

## 2014-08-10 ENCOUNTER — Ambulatory Visit: Payer: Medicaid Other

## 2014-09-08 ENCOUNTER — Telehealth: Payer: Self-pay | Admitting: *Deleted

## 2014-09-08 ENCOUNTER — Ambulatory Visit (AMBULATORY_SURGERY_CENTER): Payer: Self-pay | Admitting: *Deleted

## 2014-09-08 VITALS — Ht 68.0 in | Wt 142.2 lb

## 2014-09-08 DIAGNOSIS — Z1211 Encounter for screening for malignant neoplasm of colon: Secondary | ICD-10-CM

## 2014-09-08 MED ORDER — MOVIPREP 100 G PO SOLR: 1.0000 | Freq: Once | ORAL | Status: DC

## 2014-09-08 NOTE — Telephone Encounter (Signed)
ROI Received and faxed to [facility].

## 2014-09-08 NOTE — Progress Notes (Signed)
March 2014 at [facility] for idiopathic transverse myelitis. Pt still has some lingering affects of this in legs and right hand. ewm  No egg or soy allergy. ewm No problems with past sedation. ewm No diet pills. No home 02. ewm Previous colon 10 years ago. Will send for records to [facility]. Records release signed by pt, to 3rd floor. ewm Pt concerned about sedation due to an issue her mother recently had with a surgery. We discussed propofol and pt more comfortable about sedation. ewm

## 2014-09-08 NOTE — Telephone Encounter (Signed)
Records release to [facility] for pt. Gail Gilbert MRN [id number]. Pt has colon scheduled for 10-2 w/ Dr Olevia Perches. She has previous colons at [facility] with a Dr Dorrene German per pt.greater than 10 years ago or at least 10 years ago per pt.    Thanks  marie pre visit

## 2014-09-11 ENCOUNTER — Telehealth: Payer: Self-pay | Admitting: Internal Medicine

## 2014-09-11 NOTE — Telephone Encounter (Signed)
Voucher for free MoviPrep called in to [employer].  Pt aware

## 2014-09-15 ENCOUNTER — Encounter: Payer: Medicaid Other | Admitting: Internal Medicine

## 2014-09-18 ENCOUNTER — Telehealth: Payer: Self-pay | Admitting: *Deleted

## 2014-09-18 NOTE — Telephone Encounter (Signed)
Dr Olevia Perches has reviewed records from [facility] (only records sent was from [facility] 03/13/08). Dr Olevia Perches indicates that we can schedule colonoscopy. If she is having symptoms of GERD or dysphagia, we can schedule EGD as well. I have left a message for patient to call back.

## 2014-09-19 NOTE — Telephone Encounter (Signed)
Patient called back stating that she would like to hold off on any procedure at this time until she gets her insurance plan figured out. She is advised to call back when ready to schedule.

## 2015-01-22 ENCOUNTER — Other Ambulatory Visit: Payer: Self-pay | Admitting: Internal Medicine

## 2015-01-22 NOTE — Telephone Encounter (Signed)
Called patient to let her know that she would need to make an appointment to see MD in order to get refill on Zoloft.  Transferred patient to front to make Appointment.  Kizzie Bane

## 2015-01-25 ENCOUNTER — Other Ambulatory Visit: Payer: Self-pay | Admitting: Internal Medicine

## 2015-01-29 ENCOUNTER — Ambulatory Visit: Payer: Medicaid Other | Admitting: Internal Medicine

## 2015-02-05 ENCOUNTER — Ambulatory Visit: Payer: Medicaid Other | Admitting: Internal Medicine

## 2015-03-12 ENCOUNTER — Encounter: Payer: Self-pay | Admitting: Internal Medicine

## 2015-03-12 ENCOUNTER — Ambulatory Visit: Payer: Medicaid Other | Attending: Internal Medicine | Admitting: Internal Medicine

## 2015-03-12 VITALS — BP 138/68 | HR 85 | Temp 98.3°F | Ht 68.0 in | Wt 140.8 lb

## 2015-03-12 DIAGNOSIS — R2689 Other abnormalities of gait and mobility: Secondary | ICD-10-CM

## 2015-03-12 DIAGNOSIS — Z1211 Encounter for screening for malignant neoplasm of colon: Secondary | ICD-10-CM | POA: Diagnosis not present

## 2015-03-12 DIAGNOSIS — F329 Major depressive disorder, single episode, unspecified: Secondary | ICD-10-CM

## 2015-03-12 DIAGNOSIS — M25559 Pain in unspecified hip: Secondary | ICD-10-CM | POA: Insufficient documentation

## 2015-03-12 DIAGNOSIS — F32A Depression, unspecified: Secondary | ICD-10-CM

## 2015-03-12 DIAGNOSIS — M25551 Pain in right hip: Secondary | ICD-10-CM

## 2015-03-12 MED ORDER — TRAZODONE HCL 100 MG PO TABS: 100.0000 mg | ORAL_TABLET | Freq: Every evening | ORAL | Status: DC | PRN

## 2015-03-12 MED ORDER — TRAZODONE HCL 50 MG PO TABS: 50.0000 mg | ORAL_TABLET | Freq: Every evening | ORAL | Status: DC | PRN

## 2015-03-12 NOTE — Patient Instructions (Signed)
Generalized Anxiety Disorder Generalized anxiety disorder (GAD) is a mental disorder. It interferes with life functions, including relationships, work, and school. GAD is different from normal anxiety, which everyone experiences at some point in their lives in response to specific life events and activities. Normal anxiety actually helps [place] prepare for and get through these life events and activities. Normal anxiety goes away after the event or activity is over.  GAD causes anxiety that is not necessarily related to specific events or activities. It also causes excess anxiety in proportion to specific events or activities. The anxiety associated with GAD is also difficult to control. GAD can vary from mild to severe. People with severe GAD can have intense waves of anxiety with physical symptoms (panic attacks).  SYMPTOMS The anxiety and worry associated with GAD are difficult to control. This anxiety and worry are related to many life events and activities and also occur more days than not for 6 months or longer. People with GAD also have three or more of the following symptoms (one or more in children):  Restlessness.   Fatigue.  Difficulty concentrating.   Irritability.  Muscle tension.  Difficulty sleeping or unsatisfying sleep. DIAGNOSIS GAD is diagnosed through an assessment by your health care provider. Your health care provider will ask you questions aboutyour mood,physical symptoms, and events in your life. Your health care provider may ask you about your medical history and use of alcohol or drugs, including prescription medicines. Your health care provider may also do a physical exam and blood tests. Certain medical conditions and the use of certain substances can cause symptoms similar to those associated with GAD. Your health care provider may refer you to a mental health specialist for further evaluation. TREATMENT The following therapies are usually used to treat GAD:    Medication. Antidepressant medication usually is prescribed for long-term daily control. Antianxiety medicines may be added in severe cases, especially when panic attacks occur.   Talk therapy (psychotherapy). Certain types of talk therapy can be helpful in treating GAD by providing support, education, and guidance. A form of talk therapy called cognitive behavioral therapy can teach you healthy ways to think about and react to daily life events and activities.  Stress managementtechniques. These include yoga, meditation, and exercise and can be very helpful when they are practiced regularly. A mental health specialist can help determine which treatment is best for you. Some people see improvement with one therapy. However, other people require a combination of therapies. Document Released: 03/28/2013 Document Revised: 04/17/2014 Document Reviewed: 03/28/2013 [facility] Patient Information 2015 [place], [place]. This information is not intended to replace advice given to you by your health care provider. Make sure you discuss any questions you have with your health care provider. Depression Depression refers to feeling sad, low, down in the dumps, blue, gloomy, or empty. In general, there are two kinds of depression:  Normal sadness or normal grief. This kind of depression is one that we all feel from time to time after upsetting life experiences, such as the loss of a job or the ending of a relationship. This kind of depression is considered normal, is short lived, and resolves within a few days to 2 weeks. Depression experienced after the loss of a loved one (bereavement) often lasts longer than 2 weeks but normally gets better with time.  Clinical depression. This kind of depression lasts longer than normal sadness or normal grief or interferes with your ability to function at home, at work, and in school.  It also interferes with your personal relationships. It affects almost every aspect of your  life. Clinical depression is an illness. Symptoms of depression can also be caused by conditions other than those mentioned above, such as:  Physical illness. Some physical illnesses, including underactive thyroid gland (hypothyroidism), severe anemia, specific types of cancer, diabetes, uncontrolled seizures, heart and lung problems, strokes, and chronic pain are commonly associated with symptoms of depression.  Side effects of some prescription medicine. In some people, certain types of medicine can cause symptoms of depression.  Substance abuse. Abuse of alcohol and illicit drugs can cause symptoms of depression. SYMPTOMS Symptoms of normal sadness and normal grief include the following:  Feeling sad or crying for short periods of time.  Not caring about anything (apathy).  Difficulty sleeping or sleeping too much.  No longer able to enjoy the things you used to enjoy.  Desire to be by oneself all the time (social isolation).  Lack of energy or motivation.  Difficulty concentrating or remembering.  Change in appetite or weight.  Restlessness or agitation. Symptoms of clinical depression include the same symptoms of normal sadness or normal grief and also the following symptoms:  Feeling sad or crying all the time.  Feelings of guilt or worthlessness.  Feelings of hopelessness or helplessness.  Thoughts of suicide or the desire to harm yourself (suicidal ideation).  Loss of touch with reality (psychotic symptoms). Seeing or hearing things that are not real (hallucinations) or having false beliefs about your life or the people around you (delusions and paranoia). DIAGNOSIS  The diagnosis of clinical depression is usually based on how bad the symptoms are and how long they have lasted. Your health care provider will also ask you questions about your medical history and substance use to find out if physical illness, use of prescription medicine, or substance abuse is causing  your depression. Your health care provider may also order blood tests. TREATMENT  Often, normal sadness and normal grief do not require treatment. However, sometimes antidepressant medicine is given for bereavement to ease the depressive symptoms until they resolve. The treatment for clinical depression depends on how bad the symptoms are but often includes antidepressant medicine, counseling with a mental health professional, or both. Your health care provider will help to determine what treatment is best for you. Depression caused by physical illness usually goes away with appropriate medical treatment of the illness. If prescription medicine is causing depression, talk with your health care provider about stopping the medicine, decreasing the dose, or changing to another medicine. Depression caused by the abuse of alcohol or illicit drugs goes away when you stop using these substances. Some adults need professional help in order to stop drinking or using drugs. SEEK IMMEDIATE MEDICAL CARE IF:  You have thoughts about hurting yourself or others.  You lose touch with reality (have psychotic symptoms).  You are taking medicine for depression and have a serious side effect. FOR MORE INFORMATION  National Alliance on Mental Illness: www.nami.[employer] of Mental Health: https://carter.com/ Document Released: 11/28/2000 Document Revised: 04/17/2014 Document Reviewed: 03/01/2012 [facility] Patient Information 2015 [place], [place]. This information is not intended to replace advice given to you by your health care provider. Make sure you discuss any questions you have with your health care provider.

## 2015-03-12 NOTE — Progress Notes (Signed)
Patient ID: NIZA SODERHOLM, female   DOB: 27-Jul-1951, 64 y.o.   MRN: [id number]   Nancyann Cotterman, is a 64 y.o. female  [id number]  [id number]  DOB - 09-03-1951  Chief Complaint  Patient presents with  . Back Pain  . Hip Pain        Subjective:   Dannica Bickham is a 64 y.o. female here today for a follow up visit. Patient has history of transverse myelitis, major depression and anxiety, restless leg syndrome, spondylolisthesis and GERD. She presents today with complaints of chronic low back pain hip pain. She says this pain comes and goes, she will like to get another referral for chiropractor, when she went to last time the treatment helped but she only had one time authorization. Patient claims her depression is getting better but she does not have enough sleep at night, she wonders if her trazodone dosage can be increased. She due for colonoscopy. Patient has No headache, No chest pain, No abdominal pain - No Nausea, No new weakness tingling or numbness, No Cough - SOB.  Problem  Hip Pain    ALLERGIES: Allergies  Allergen Reactions  . Antihistamines, Diphenhydramine-Type Palpitations    PAST MEDICAL HISTORY: Past Medical History  Diagnosis Date  . Arthritis     cervical,feet  . Depression   . Migraine   . GERD (gastroesophageal reflux disease) 12/2006    esophageal stricture  . Vitamin D deficiency   . MVP (mitral valve prolapse)   . Spondylolisthesis     L4-5  . RLS (restless legs syndrome)   . Anxiety   . Interstitial cystitis   . IBS (irritable bowel syndrome)   . Insomnia   . DVT (deep vein thrombosis) in pregnancy 1989    postpartum  . Idiopathic transverse myelitis     02/2013    MEDICATIONS AT HOME: Prior to Admission medications   Medication Sig Start Date End Date Taking? Authorizing Provider  dicyclomine (BENTYL) 20 MG tablet Take 1 tablet (20 mg total) by mouth every 6 (six) hours. 07/06/14  Yes Tresa Garter, MD  fish oil-omega-3 fatty acids  1000 MG capsule Take 2 g by mouth every morning.    Yes Historical Provider, MD  Multiple Vitamin (MULTIVITAMIN) capsule Take 1 capsule by mouth daily.     Yes Historical Provider, MD  naproxen sodium (ANAPROX) 220 MG tablet Take 220 mg by mouth 2 (two) times daily with a meal.   Yes Historical Provider, MD  traZODone (DESYREL) 100 MG tablet Take 1 tablet (100 mg total) by mouth at bedtime as needed for sleep. For insomnia. 03/12/15  Yes Tresa Garter, MD  cyclobenzaprine (FLEXERIL) 10 MG tablet Take 1 tablet (10 mg total) by mouth 3 (three) times daily as needed for muscle spasms. Patient not taking: Reported on 03/12/2015 05/30/14   Tresa Garter, MD  MOVIPREP 100 G SOLR Take 1 kit (200 g total) by mouth once. moviprep as directed. No substitutions Patient not taking: Reported on 03/12/2015 09/08/14   Lafayette Dragon, MD     Objective:   Filed Vitals:   03/12/15 0950  BP: 138/68  Pulse: 85  Temp: 98.3 F (36.8 C)  TempSrc: Oral  Height: '5\' 8"'  (1.727 m)  Weight: 140 lb 12.8 oz (63.866 kg)  SpO2: 98%    Exam General appearance : Awake, alert, not in any distress. Speech Clear. Not toxic looking HEENT: Atraumatic and Normocephalic, pupils equally reactive to light and accomodation Neck: supple, no  JVD. No cervical lymphadenopathy.  Chest:Good air entry bilaterally, no added sounds  CVS: S1 S2 regular, no murmurs.  Abdomen: Bowel sounds present, Non tender and not distended with no gaurding, rigidity or rebound. Extremities: B/L Lower Ext shows no edema, both legs are warm to touch Neurology: Awake alert, and oriented X 3, CN II-XII intact, Non focal Skin:No Rash  Data Review No results found for: HGBA1C   Assessment & Plan   1. Hip pain, right  - Ambulatory referral to Chiropractic  2. Limping  - Ambulatory referral to Chiropractic  3. Depression (emotion) Increase - traZODone (DESYREL) 100 MG tablet; Take 1 tablet (100 mg total) by mouth at bedtime as needed  for sleep. For insomnia.  Dispense: 30 tablet; Refill: 6  4. Colon cancer screening  - [facility] COLONOSCOPY - Ambulatory referral to Gastroenterology  Patient have been counseled extensively about nutrition and exercise  Return in about 6 months (around 09/12/2015) for Follow up Pain and comorbidities, Depression, Generalized Anxiety Disorder.  The patient was given clear instructions to go to ER or return to medical center if symptoms don't improve, worsen or new problems develop. The patient verbalized understanding. The patient was told to call to get lab results if they haven't heard anything in the next week.   This note has been created with Surveyor, quantity. Any transcriptional errors are unintentional.    Angelica Chessman, MD, [place], [place], [place], [place] and [place], [place]   03/12/2015, 10:45 AM

## 2015-03-12 NOTE — Progress Notes (Signed)
Patient presents today for chronic lower back and hip pain. States that she has sciatica which comes and goes. She would like to get another referral back to a chiropractor, when she went it was only for a 1 time authorization. Patient has Medicaid and needs new referral with more than one authorization. Patient sees Dr. Teryl Lucy off of [address].

## 2015-03-26 ENCOUNTER — Other Ambulatory Visit: Payer: Self-pay | Admitting: Internal Medicine

## 2015-04-09 ENCOUNTER — Telehealth: Payer: Self-pay

## 2015-04-09 NOTE — Telephone Encounter (Signed)
Patient requesting refill on her flexeril States in her medications that she is no longer taking this Can this be refilled?

## 2015-04-10 ENCOUNTER — Encounter: Payer: Self-pay | Admitting: Internal Medicine

## 2015-04-10 MED ORDER — CYCLOBENZAPRINE HCL 10 MG PO TABS: 10.0000 mg | ORAL_TABLET | Freq: Three times a day (TID) | ORAL | Status: DC | PRN

## 2015-04-16 ENCOUNTER — Encounter: Payer: Self-pay | Admitting: Internal Medicine

## 2015-04-16 ENCOUNTER — Ambulatory Visit: Payer: Medicaid Other | Attending: Internal Medicine | Admitting: Internal Medicine

## 2015-04-16 VITALS — BP 140/77 | HR 77 | Temp 98.0°F | Resp 16 | Wt 139.0 lb

## 2015-04-16 DIAGNOSIS — I341 Nonrheumatic mitral (valve) prolapse: Secondary | ICD-10-CM | POA: Diagnosis not present

## 2015-04-16 DIAGNOSIS — Z791 Long term (current) use of non-steroidal anti-inflammatories (NSAID): Secondary | ICD-10-CM | POA: Insufficient documentation

## 2015-04-16 DIAGNOSIS — F329 Major depressive disorder, single episode, unspecified: Secondary | ICD-10-CM | POA: Diagnosis not present

## 2015-04-16 DIAGNOSIS — G4700 Insomnia, unspecified: Secondary | ICD-10-CM | POA: Diagnosis not present

## 2015-04-16 DIAGNOSIS — Z86718 Personal history of other venous thrombosis and embolism: Secondary | ICD-10-CM | POA: Diagnosis not present

## 2015-04-16 DIAGNOSIS — G373 Acute transverse myelitis in demyelinating disease of central nervous system: Secondary | ICD-10-CM | POA: Diagnosis not present

## 2015-04-16 DIAGNOSIS — K219 Gastro-esophageal reflux disease without esophagitis: Secondary | ICD-10-CM | POA: Insufficient documentation

## 2015-04-16 DIAGNOSIS — F32A Depression, unspecified: Secondary | ICD-10-CM

## 2015-04-16 MED ORDER — TRAZODONE HCL 100 MG PO TABS: 100.0000 mg | ORAL_TABLET | Freq: Every evening | ORAL | Status: DC | PRN

## 2015-04-16 MED ORDER — SERTRALINE HCL 100 MG PO TABS: 50.0000 mg | ORAL_TABLET | Freq: Every day | ORAL | Status: DC

## 2015-04-16 NOTE — Progress Notes (Signed)
Patient here for follow up Patient is currently back taking sertraline for her anxiety and baclofen for her muscle spasticity Patient will be going to [place] for a couple of months and will need enough medication to last her Patient also requesting a referral to neurologist

## 2015-04-16 NOTE — Patient Instructions (Signed)
Generalized Anxiety Disorder Generalized anxiety disorder (GAD) is a mental disorder. It interferes with life functions, including relationships, work, and school. GAD is different from normal anxiety, which everyone experiences at some point in their lives in response to specific life events and activities. Normal anxiety actually helps [place] prepare for and get through these life events and activities. Normal anxiety goes away after the event or activity is over.  GAD causes anxiety that is not necessarily related to specific events or activities. It also causes excess anxiety in proportion to specific events or activities. The anxiety associated with GAD is also difficult to control. GAD can vary from mild to severe. People with severe GAD can have intense waves of anxiety with physical symptoms (panic attacks).  SYMPTOMS The anxiety and worry associated with GAD are difficult to control. This anxiety and worry are related to many life events and activities and also occur more days than not for 6 months or longer. People with GAD also have three or more of the following symptoms (one or more in children):  Restlessness.   Fatigue.  Difficulty concentrating.   Irritability.  Muscle tension.  Difficulty sleeping or unsatisfying sleep. DIAGNOSIS GAD is diagnosed through an assessment by your health care provider. Your health care provider will ask you questions aboutyour mood,physical symptoms, and events in your life. Your health care provider may ask you about your medical history and use of alcohol or drugs, including prescription medicines. Your health care provider may also do a physical exam and blood tests. Certain medical conditions and the use of certain substances can cause symptoms similar to those associated with GAD. Your health care provider may refer you to a mental health specialist for further evaluation. TREATMENT The following therapies are usually used to treat GAD:    Medication. Antidepressant medication usually is prescribed for long-term daily control. Antianxiety medicines may be added in severe cases, especially when panic attacks occur.   Talk therapy (psychotherapy). Certain types of talk therapy can be helpful in treating GAD by providing support, education, and guidance. A form of talk therapy called cognitive behavioral therapy can teach you healthy ways to think about and react to daily life events and activities.  Stress managementtechniques. These include yoga, meditation, and exercise and can be very helpful when they are practiced regularly. A mental health specialist can help determine which treatment is best for you. Some people see improvement with one therapy. However, other people require a combination of therapies. Document Released: 03/28/2013 Document Revised: 04/17/2014 Document Reviewed: 03/28/2013 [facility] Patient Information 2015 [place], [place]. This information is not intended to replace advice given to you by your health care provider. Make sure you discuss any questions you have with your health care provider. Transverse Myelitis Transverse myelitis is a disorder of the spinal cord. Inflammation in the spinal cord can hurt the fatty substance (myelin) that covers the nerve fibers. This would be similar to losing the insulation around electrical wires. This damage causes problems with the nerve cells (neurons) in the spinal cord that carry signals from the brain to the rest of the body. The part of the spinal cord affected determines which parts of the body are affected. An injury to the spinal cord at the level of the chest can cause problems with leg movement and bowel and bladder control. An injury to the spinal cord at the level of the neck can cause problems with sensation and motor function of the arms, legs, and bowel and bladder control.  If an injury is high enough in the neck, it may even affect breathing. Some patients  recover from this disorder with minor or no lasting problems. Others may have lasting problems that make daily living difficult. Most patients will have only 1 episode of transverse myelitis. A small percentage may have problems return. CAUSES  The exact cause is not known. Some of the things thought to cause this problem include:  Viral infections, such as herpes, chickenpox (varicella zoster), cytomegalovirus, and Epstein-Barr virus.  Abnormal immune reactions.  Poor blood flow to the spinal cord. This disorder may also occur as a complication of:  Syphilis.  Measles.  Lyme disease.  Chickenpox, if severe.  Some vaccinations, including those for chickenpox and rabies. SYMPTOMS  Symptoms can come on over several hours to weeks. Transverse myelitis may cause a loss of spinal cord function over several hours to several weeks. Some symptoms are:  Headaches.  Fever.  Loss of appetite.  A sudden onset of lower back pain.  Abnormal sensations in the toes and feet.  Weakness in the arms and legs.  Bowel and urinary problems. DIAGNOSIS   Medical history and neurological exam.  Magnetic resonance imaging (MRI). This procedure provides a picture of the brain and spinal cord.  Myelography. This procedure involves injecting a dye and taking X-rays.  Blood tests may be performed.  A spinal tap may be done to examine the spinal fluid. TREATMENT  No cure exists for this illness. Treatments are designed to help decrease the symptoms.  Corticosteroid treatment is often used during the first few weeks of illness. This decreases inflammation.  Pain medicines are prescribed as needed.  Sometimes, respirators are needed if there is severe difficulty breathing.  Physical therapy may be used to keep muscles flexible and strong.  Movement decreases the chances of pressure sores developing.  Later, if patients begin to recover limb control, physical therapy helps begin to improve  muscle strength, coordination, and range of motion. HOME CARE INSTRUCTIONS  Much can be done for people with permanent disabilities.  Treatment programs and other resources are likely available in your community. Medical social workers can help you locate this information.  Rehabilitative therapy will teach you how to care for yourself. Rehabilitation cannot reverse the physical damage resulting from this illness. However, it can help people, even those with severe paralysis, become as independent as possible. This helps you attain the best possible quality of life.  Medicines can be prescribed by your caregiver. These medicines can help with depression and adapting to a changed way of life. A wide variety of drugs now exist that can help with the pain from spinal cord injuries.  Increasing your strength and endurance is possible. Treatment improves coordination and reduces muscle spasms (spasticity) and muscle wasting in paralyzed limbs. Physical therapists can help you regain greater control over bladder and bowel function through various exercises. They can also teach paralyzed patients techniques for using assistive devices (wheelchairs, canes, braces) as effectively as possible.  Learning new ways of performing everyday tasks is important. These tasks include bathing, dressing, preparing a meal, house cleaning, engaging in arts and crafts, or gardening. Occupational therapists can help you with this. [place] of Neurological Disorders and Stroke: MasterBoxes.it Transverse Myelitis Association: www.myelitis.org American Chronic Pain Association: www.theacpa.[place] to Cure Paralysis: www.themiamiproject.[place]: ForexFest.com.pt Document Released: 11/21/2002 Document Revised: 02/23/2012 Document Reviewed: 02/27/2010 [facility] Patient Information 2015 [place], [place]. This information is not intended to  replace  advice given to you by your health care provider. Make sure you discuss any questions you have with your health care provider.

## 2015-04-16 NOTE — Progress Notes (Signed)
Patient ID: Gail Gilbert, female   DOB: 08/21/1951, 64 y.o.   MRN: [id number]   Gail Gilbert, is a 64 y.o. female  BOF:[id number]  DPO:[id number]  DOB - 07-29-1951  Chief Complaint  Patient presents with  . Follow-up        Subjective:   Gail Gilbert is a 64 y.o. female here today for a follow up visit. Pleasant woman with history of idiopathic transverse myelitis, major depression and anxiety, restless leg syndrome, spondylolisthesis and GERD. She came today requesting refill of her medications for anxiety and depression to last for a long time because she is contemplating on traveling to [place] with her husband for the summer, maybe returning to [place] in October. Her other complaint is occasional spasm all over her body which she thinks may be due to her transverse myelitis, she will like to be referred to a neurologist. She wants to restart her baclofen, she is also on muscle relaxant including Flexeril. She endorsed being anxious about her travel to [place] since she just got married to her new husband a year ago and does not want to leave all her family here in [place]. She restarted her sertraline and trazodone recently. She does not smoke cigarettes, she does not drink alcohol. She denies any other symptoms. Patient has No headache, No chest pain, No abdominal pain - No Nausea, No new weakness tingling or numbness, No Cough - SOB.  No problems updated.  ALLERGIES: Allergies  Allergen Reactions  . Antihistamines, Diphenhydramine-Type Palpitations    PAST MEDICAL HISTORY: Past Medical History  Diagnosis Date  . Arthritis     cervical,feet  . Depression   . Migraine   . GERD (gastroesophageal reflux disease) 12/2006    esophageal stricture  . Vitamin D deficiency   . MVP (mitral valve prolapse)   . Spondylolisthesis     L4-5  . RLS (restless legs syndrome)   . Anxiety   . Interstitial cystitis   . IBS (irritable bowel syndrome)   . Insomnia   . DVT (deep  vein thrombosis) in pregnancy 1989    postpartum  . Idiopathic transverse myelitis     02/2013    MEDICATIONS AT HOME: Prior to Admission medications   Medication Sig Start Date End Date Taking? Authorizing Provider  baclofen (LIORESAL) 10 MG tablet Take 10 mg by mouth 3 (three) times daily.   Yes Historical Provider, MD  sertraline (ZOLOFT) 100 MG tablet Take 0.5 tablets (50 mg total) by mouth daily. 04/16/15  Yes Tresa Garter, MD  cyclobenzaprine (FLEXERIL) 10 MG tablet Take 1 tablet (10 mg total) by mouth 3 (three) times daily as needed for muscle spasms. 04/10/15   Tresa Garter, MD  dicyclomine (BENTYL) 20 MG tablet Take 1 tablet (20 mg total) by mouth every 6 (six) hours. 07/06/14   Tresa Garter, MD  fish oil-omega-3 fatty acids 1000 MG capsule Take 2 g by mouth every morning.     Historical Provider, MD  MOVIPREP 100 G SOLR Take 1 kit (200 g total) by mouth once. moviprep as directed. No substitutions Patient not taking: Reported on 03/12/2015 09/08/14   Lafayette Dragon, MD  Multiple Vitamin (MULTIVITAMIN) capsule Take 1 capsule by mouth daily.      Historical Provider, MD  naproxen sodium (ANAPROX) 220 MG tablet Take 220 mg by mouth 2 (two) times daily with a meal.    Historical Provider, MD  traZODone (DESYREL) 100 MG tablet Take 1 tablet (100  mg total) by mouth at bedtime as needed for sleep. For insomnia. 04/16/15   Tresa Garter, MD     Objective:   Filed Vitals:   04/16/15 1051  BP: 140/77  Pulse: 77  Temp: 98 F (36.7 C)  Resp: 16  Weight: 139 lb (63.05 kg)  SpO2: 100%    Exam General appearance : Awake, alert, not in any distress. Speech Clear. Not toxic looking HEENT: Atraumatic and Normocephalic, pupils equally reactive to light and accomodation Neck: supple, no JVD. No cervical lymphadenopathy.  Chest:Good air entry bilaterally, no added sounds  CVS: S1 S2 regular, no murmurs.  Abdomen: Bowel sounds present, Non tender and not distended with  no gaurding, rigidity or rebound. Extremities: B/L Lower Ext shows no edema, both legs are warm to touch Neurology: Awake alert, and oriented X 3, CN II-XII intact, Non focal Skin:No Rash  Data Review No results found for: HGBA1C   Assessment & Plan   1. Depression (emotion)  - sertraline (ZOLOFT) 100 MG tablet; Take 0.5 tablets (50 mg total) by mouth daily.  Dispense: 90 tablet; Refill: 3 - traZODone (DESYREL) 100 MG tablet; Take 1 tablet (100 mg total) by mouth at bedtime as needed for sleep. For insomnia.  Dispense: 90 tablet; Refill: 3  2. Idiopathic transverse myelitis  - Ambulatory referral to Neurology though determining the need for baclofen for further management of occasional spasm which patient attributes to sequelae of her transverse myelitis. Patient may need a repeat of MRI.  Patient have been counseled extensively about nutrition and exercise Return in about 6 months (around 10/17/2015) for Follow up HTN, Follow up Pain and comorbidities.  The patient was given clear instructions to go to ER or return to medical center if symptoms don't improve, worsen or new problems develop. The patient verbalized understanding. The patient was told to call to get lab results if they haven't heard anything in the next week.   This note has been created with Surveyor, quantity. Any transcriptional errors are unintentional.    Angelica Chessman, MD, [place], [place], [place], [place] and [place], [place]   04/16/2015, 11:40 AM

## 2015-05-01 ENCOUNTER — Ambulatory Visit (INDEPENDENT_AMBULATORY_CARE_PROVIDER_SITE_OTHER): Payer: Medicaid Other | Admitting: Diagnostic Neuroimaging

## 2015-05-01 ENCOUNTER — Encounter: Payer: Self-pay | Admitting: Diagnostic Neuroimaging

## 2015-05-01 VITALS — BP 117/73 | HR 76 | Ht 68.0 in | Wt 139.0 lb

## 2015-05-01 DIAGNOSIS — R202 Paresthesia of skin: Secondary | ICD-10-CM

## 2015-05-01 DIAGNOSIS — M62838 Other muscle spasm: Secondary | ICD-10-CM

## 2015-05-01 DIAGNOSIS — G373 Acute transverse myelitis in demyelinating disease of central nervous system: Secondary | ICD-10-CM

## 2015-05-01 DIAGNOSIS — F411 Generalized anxiety disorder: Secondary | ICD-10-CM

## 2015-05-01 DIAGNOSIS — R200 Anesthesia of skin: Secondary | ICD-10-CM

## 2015-05-01 MED ORDER — BACLOFEN 10 MG PO TABS: 10.0000 mg | ORAL_TABLET | Freq: Three times a day (TID) | ORAL | Status: DC | PRN

## 2015-05-01 NOTE — Progress Notes (Signed)
[facility]  PATIENT: Gail Gilbert DOB: 08-13-1951  REFERRING CLINICIAN: Jegede HISTORY FROM: patient  REASON FOR VISIT: new consult (former Dr. Janann Colonel patient)   HISTORICAL  CHIEF COMPLAINT:  Chief Complaint  Patient presents with  . New Evaluation    idiopathic tranverse myelitis     HISTORY OF PRESENT ILLNESS:   64 year old right-handed female here for evaluation of transverse myelitis. Gail Gilbert 2014 patient was in [place], developed low back pain, numbness and tingling in the feet. She came back to [place] and one day had significant increase in numbness in her toes, feet, legs, up to her hips and abdominal region. She developed progressive weakness to the point where she was not able to walk. She noticed some numbness and weakness in her right hand. Patient was admitted to the hospital and evaluated for her symptoms. Initial MRI of the brain, cervical and lumbar spine were unremarkable. Follow-up MRI of the cervical and lumbar spine demonstrated abnormal enhancing lesions in the cervical spinal cord from C3-C5, posterior and laterally to the left side. She was treated with IV steroids. Extensive blood work testing and spinal fluid analysis were unremarkable. NMO antibodies and oligoclonal bands were not checked. Symptoms gradually improved and by discharge she was able to use a walker.  In January 2015 patient had 30 minutes of blurred vision. She was developing more numbness, right hip pain and stiffness, constipation. She saw Dr. Janann Colonel at [facility] for follow up evaluation, who checked visual evoked potentials which were normal.  Now patient developing significant anxiety, muscle spasm, in association with external psychosocial stressors and family demands.   REVIEW OF SYSTEMS: Full 14 system review of systems performed and notable only for anxiety numbness weakness restless legs cramps constipation urination problems fatigue.   ALLERGIES: Allergies    Allergen Reactions  . Antihistamines, Diphenhydramine-Type Palpitations    HOME MEDICATIONS: Outpatient Prescriptions Prior to Visit  Medication Sig Dispense Refill  . Multiple Vitamin (MULTIVITAMIN) capsule Take 1 capsule by mouth daily.      . naproxen sodium (ANAPROX) 220 MG tablet Take 220 mg by mouth 2 (two) times daily with a meal.    . traZODone (DESYREL) 100 MG tablet Take 1 tablet (100 mg total) by mouth at bedtime as needed for sleep. For insomnia. (Patient taking differently: Take 50 mg by mouth at bedtime as needed for sleep. For insomnia.) 90 tablet 3  . baclofen (LIORESAL) 10 MG tablet Take 10 mg by mouth 3 (three) times daily.    Marland Kitchen dicyclomine (BENTYL) 20 MG tablet Take 1 tablet (20 mg total) by mouth every 6 (six) hours. (Patient not taking: Reported on 05/01/2015) 30 tablet 3  . cyclobenzaprine (FLEXERIL) 10 MG tablet Take 1 tablet (10 mg total) by mouth 3 (three) times daily as needed for muscle spasms. 45 tablet 0  . fish oil-omega-3 fatty acids 1000 MG capsule Take 2 g by mouth every morning.     Marland Kitchen MOVIPREP 100 G SOLR Take 1 kit (200 g total) by mouth once. moviprep as directed. No substitutions (Patient not taking: Reported on 03/12/2015) 1 kit 0  . sertraline (ZOLOFT) 100 MG tablet Take 0.5 tablets (50 mg total) by mouth daily. 90 tablet 3   No facility-administered medications prior to visit.    PAST MEDICAL HISTORY: Past Medical History  Diagnosis Date  . Arthritis     cervical,feet  . Depression   . Migraine   . GERD (gastroesophageal reflux disease) 12/2006    esophageal  stricture  . Vitamin D deficiency   . MVP (mitral valve prolapse)   . Spondylolisthesis     L4-5  . RLS (restless legs syndrome)   . Anxiety   . Interstitial cystitis   . IBS (irritable bowel syndrome)   . Insomnia   . DVT (deep vein thrombosis) in pregnancy 1989    postpartum  . Idiopathic transverse myelitis     02/2013    PAST SURGICAL HISTORY: Past Surgical History  Procedure  Laterality Date  . Colonoscopy    . Bunionectomy  09,05    both feet  . Upper gastrointestinal endoscopy    . Ear examination under anesthesia      to correct ringing in ear at [facility]  . Lumbar aspiration and drainage      cyst  . Hammer toe surgery  10/29/2011    Procedure: HAMMER TOE CORRECTION;  Surgeon: Kerin Salen;  Location: [facility];  Service: Orthopedics;  Laterality: Left;  Left 2nd toe duvries arthroplasty  . Decompressive lumbar laminectomy level 4  1999    FAMILY HISTORY: Family History  Problem Relation Age of Onset  . Arthritis Mother     spinal stenosis  . Cataracts Mother   . Heart disease Mother     pacemaker  . Hypertension Father   . Heart disease Father     Quarry manager  . Congestive Heart Failure Father     SOCIAL HISTORY:  History   Social History  . Marital Status: Married    Spouse Name: Gail Gilbert  . Number of Children: 1  . Years of Education: college   Occupational History  . unemployed     former Secretary/administrator   Social History Main Topics  . Smoking status: Never Smoker   . Smokeless tobacco: Never Used  . Alcohol Use: 0.0 oz/week     Comment: occasionally  . Drug Use: No  . Sexual Activity:    Partners: Male    Birth Control/ Protection: Post-menopausal   Other Topics Concern  . Not on file   Social History Narrative   Divorced.   Lives alone.   Adult children live in town.  Her parents are in [place]    Patient is right handed   Education level is some college   Caffeine consumption is a couple of cups a day               PHYSICAL EXAM  Filed Vitals:   05/01/15 0849  BP: 117/73  Pulse: 76  Height: _0  (1.727 m)  Weight: 139 lb (63.05 kg)    Body mass index is 21.14 kg/(m^2).   Visual Acuity Screening   Right eye Left eye Both eyes  Without correction:     With correction: 20/30 20/30     No flowsheet data found.  GENERAL EXAM: Patient is in no distress; well developed,  nourished and groomed; neck is supple  CARDIOVASCULAR: Regular rate and rhythm, no murmurs, no carotid bruits  NEUROLOGIC: MENTAL STATUS: awake, alert, oriented to person, place and time, recent and remote memory intact, normal attention and concentration, language fluent, comprehension intact, naming intact, fund of knowledge appropriate CRANIAL NERVE: no papilledema on fundoscopic exam, pupils equal and reactive to light, visual fields full to confrontation, extraocular muscles intact, no nystagmus, facial sensation and strength symmetric, hearing intact, palate elevates symmetrically, uvula midline, shoulder shrug symmetric, tongue midline. MOTOR: normal bulk and tone, full strength in the BUE; BLE (HF 3, RIGHT KE  3, LEFT KE 4, KF 3, DF 4+); 1-2 BEATS CLONUS IN ANKLES SENSORY: ABSENT VIB AT TOES AND ANKLES; DEC TEMP AND PP IN FEET COORDINATION: finger-nose-finger, fine finger movements normal REFLEXES: deep tendon reflexes present and symmetric; 2+ AT KNEES; UP AND DOWN GOING TOE S GAIT/STATION: UNSTEADY GAIT; LIMPS ON RIGHT LEG; CANNOT TANDEM, TOE OR HEEL WALK; UNSTEADY WITH EYES OPEN FEET TOGETHER    DIAGNOSTIC DATA (LABS, IMAGING, TESTING) - I reviewed patient records, labs, notes, testing and imaging myself where available.  Lab Results  Component Value Date   WBC 5.0 11/22/2013   HGB 12.5 11/22/2013   HCT 36.4 11/22/2013   MCV 90.3 11/22/2013   PLT 302 11/22/2013      Component Value Date/Time   NA 140 11/22/2013 0956   K 4.3 11/22/2013 0956   CL 103 11/22/2013 0956   CO2 30 11/22/2013 0956   GLUCOSE 74 11/22/2013 0956   BUN 14 11/22/2013 0956   CREATININE 0.67 11/22/2013 0956   CREATININE 0.75 03/08/2013 0615   CALCIUM 9.3 11/22/2013 0956   PROT 6.5 11/22/2013 0956   ALBUMIN 4.0 11/22/2013 0956   AST 23 11/22/2013 0956   ALT 15 11/22/2013 0956   ALKPHOS 81 11/22/2013 0956   BILITOT 0.3 11/22/2013 0956   GFRNONAA 90* 03/08/2013 0615   GFRAA >90 03/08/2013 0615    No results found for: CHOL, HDL, LDLCALC, LDLDIRECT, TRIG, CHOLHDL No results found for: HGBA1C Lab Results  Component Value Date   IWLNLGXQ11 941 02/23/2013   Lab Results  Component Value Date   TSH 1.001 02/23/2013    [I reviewed images myself and agree with interpretation. -VRP]   02/24/13 MRI cervical spine (with and without)  - Since the prior exam the patient has developed abnormal signal in the posterior lateral columns of the spinal cord at the C3, C4 and C5 levels. The lateral column involvement is more prominent on the left. There is no enhancement of this area after contrast administration.   02/24/13 MRI lumbar spine (with and without) - Today's study was done with contrast as opposed to the previous exam. The overall morphologic findings are unchanged. There is lateral recess stenosis at L3-4 right worse than left because of bulging of the disc in combination with facet arthropathy. Fluid filled facets could allow slippage of this level with flexion or extension which could cause worsening of the lateral recess stenosis. L4-5 shows advanced facet arthropathy with anterolisthesis of 1 cm and broad-based pseudo disc herniation. The central canal has been decompressed surgically. There is stenosis of the lateral recesses right worse than left and of the foraminal left worse than right. Because contrast was administered on this exam, one can appreciate enhancement and edema in the region of the facet on the right at L3- 4, the facet on the left at L3-4 to a lesser extent, and the facets at L4-5 left worse than right. There is also some endplate enhancement on the left at L4-5. There is some enhancement in the dorsal epidural space in the L3-L4 region. I think it is most likely that this finding relates to degenerative disease and epidural inflammation associated with that. However, by imaging, one could not rule out the possibility of infectious inflammation. If there is clinical  evidence of infectious inflammation, this study may lend support to that.   02/20/13 MRI brain - Scattered small subcortical white matter hyperintensities in the cerebral white matter on the right. These are most likely related to chronic microvascular ischemia.  Differential includes migraine headache and vasculitis. No acute infarct or mass. Multiple sclerosis not felt likely.  02/20/13 MRI cervical spine (without) - Cervical spondylosis. No cord compression or significant spinal stenosis. These findings appear chronic.  02/20/13 MRI thoracic spine (without) - No acute abnormality in the cervical spine. Specifically no cord compression or cord lesion is present.  02/20/13 MRI lumbar spine (without) - The conus medullaris is normal. Lumbar degenerative changes as above. These appear chronic. I discussed the findings by telephone with Dr. Nicole Kindred.  02/21/13 CSF labs - WBC 1, RBC 6, glucose 100, protein 32, stain (no organisms), no fungi  02/23/13 HIV nr, RPR nr, anti-ganglioside GM ab IgG < 1:800, anti-GAD ab < 1.0, B12 765, lyme ab negative, ANA neg, TSH 1.001, ceruloplasmin 22 (nl 20-60)     ASSESSMENT AND PLAN  64 y.o. year old female here with idiopathic transverse myelitis at C3-C5 in March 2014. Some progression of symptoms with more numbness, muscle spasms and anxiety. Will check add'l workup.   Ddx: idiopathic transverse myelitis, multiple sclerosis, devic's disease (neuromyelitis optica)  PLAN: - MRI scans - labs - baclofen prn muscle spasms - PT eval - psych eval - may consider repeat LP in future, with oligoclonal band testing  Orders Placed This Encounter  Procedures  . MR Brain W Wo Contrast  . MR Cervical Spine W Wo Contrast  . MR Thoracic Spine W Wo Contrast  . Neuromyelitis Optica AQP4 Auto Ab  . Ambulatory referral to Physical Therapy  . Ambulatory referral to Psychiatry   Meds ordered this encounter  Medications  . baclofen (LIORESAL) 10 MG tablet    Sig: Take  1 tablet (10 mg total) by mouth 3 (three) times daily as needed for muscle spasms.    Dispense:  30 each    Refill:  3   Return in about 6 weeks (around 06/12/2015).  I spent 40 minutes of face to face time with patient. Greater than 50% of time was spent in counseling and coordination of care with patient.     Penni Bombard, MD 9/47/6546, 5:03 AM Certified in Neurology, Neurophysiology and Neuroimaging  [facility] [address], [place], [place] [address] [phone]

## 2015-05-01 NOTE — Patient Instructions (Signed)
I will setup scans, labs, psychiatry/psychology evaluation.

## 2015-05-02 LAB — NEUROMYELITIS OPTICA AUTOAB, IGG: NMO-IgG: <1.5 U/mL (ref 0.0–3.0)

## 2015-05-04 ENCOUNTER — Other Ambulatory Visit: Payer: Self-pay | Admitting: Internal Medicine

## 2015-05-28 ENCOUNTER — Ambulatory Visit
Admission: RE | Admit: 2015-05-28 | Discharge: 2015-05-28 | Disposition: A | Discharge status: Home / Self Care | Payer: Medicaid Other | Source: Ambulatory Visit | Attending: Diagnostic Neuroimaging | Admitting: Diagnostic Neuroimaging

## 2015-05-28 DIAGNOSIS — G373 Acute transverse myelitis in demyelinating disease of central nervous system: Secondary | ICD-10-CM | POA: Diagnosis not present

## 2015-05-28 DIAGNOSIS — M62838 Other muscle spasm: Secondary | ICD-10-CM

## 2015-05-28 DIAGNOSIS — R202 Paresthesia of skin: Secondary | ICD-10-CM | POA: Diagnosis not present

## 2015-05-28 DIAGNOSIS — R200 Anesthesia of skin: Secondary | ICD-10-CM

## 2015-05-28 MED ORDER — GADOBENATE DIMEGLUMINE 529 MG/ML IV SOLN
12.0000 mL | Freq: Once | INTRAVENOUS | Status: AC | PRN
  Administered 2015-05-28: 12 mL via INTRAVENOUS

## 2015-05-29 ENCOUNTER — Ambulatory Visit
Admission: RE | Admit: 2015-05-29 | Discharge: 2015-05-29 | Disposition: A | Discharge status: Home / Self Care | Payer: Medicaid Other | Source: Ambulatory Visit | Attending: Diagnostic Neuroimaging | Admitting: Diagnostic Neuroimaging

## 2015-05-29 DIAGNOSIS — R202 Paresthesia of skin: Secondary | ICD-10-CM

## 2015-05-29 DIAGNOSIS — R200 Anesthesia of skin: Secondary | ICD-10-CM

## 2015-05-29 DIAGNOSIS — G373 Acute transverse myelitis in demyelinating disease of central nervous system: Secondary | ICD-10-CM

## 2015-05-29 DIAGNOSIS — M62838 Other muscle spasm: Secondary | ICD-10-CM

## 2015-06-11 ENCOUNTER — Ambulatory Visit
Admission: RE | Admit: 2015-06-11 | Discharge: 2015-06-11 | Disposition: A | Discharge status: Home / Self Care | Payer: Medicaid Other | Source: Ambulatory Visit | Attending: Diagnostic Neuroimaging | Admitting: Diagnostic Neuroimaging

## 2015-06-11 ENCOUNTER — Encounter (INDEPENDENT_AMBULATORY_CARE_PROVIDER_SITE_OTHER): Payer: Medicaid Other | Admitting: Neurology

## 2015-06-11 DIAGNOSIS — G373 Acute transverse myelitis in demyelinating disease of central nervous system: Secondary | ICD-10-CM

## 2015-06-11 DIAGNOSIS — R202 Paresthesia of skin: Secondary | ICD-10-CM | POA: Diagnosis not present

## 2015-06-11 MED ORDER — GADOBENATE DIMEGLUMINE 529 MG/ML IV SOLN
13.0000 mL | Freq: Once | INTRAVENOUS | Status: AC | PRN
  Administered 2015-06-11: 13 mL via INTRAVENOUS

## 2015-06-12 ENCOUNTER — Encounter: Payer: Self-pay | Admitting: Diagnostic Neuroimaging

## 2015-06-12 ENCOUNTER — Ambulatory Visit (INDEPENDENT_AMBULATORY_CARE_PROVIDER_SITE_OTHER): Payer: Medicaid Other | Admitting: Diagnostic Neuroimaging

## 2015-06-12 VITALS — BP 99/68 | HR 77 | Ht 68.0 in | Wt 142.8 lb

## 2015-06-12 DIAGNOSIS — G373 Acute transverse myelitis in demyelinating disease of central nervous system: Secondary | ICD-10-CM

## 2015-06-12 NOTE — Progress Notes (Signed)
[facility]  PATIENT: Gail Gilbert DOB: Aug 12, 1951  REFERRING CLINICIAN: Jegede HISTORY FROM: patient  REASON FOR VISIT: follow up   HISTORICAL  CHIEF COMPLAINT:  Chief Complaint  Patient presents with  . Idiopathic transverese myelitis    rm 7    HISTORY OF PRESENT ILLNESS:   UPDATE 06/12/15 (VRP): Since last visit, symptoms stable. MRIs reviewed. No significant progression. C-spine lesion has resolved. Did not try PT, psychiatry or baclofen from last visit. Overall feeling better with mood and physical symptoms.  PRIOR HPI (05/01/15, VRP): 64 year old right-handed female here for evaluation of transverse myelitis. February 2014 patient was in [place], developed low back pain, numbness and tingling in the feet. She came back to [place] and one day had significant increase in numbness in her toes, feet, legs, up to her hips and abdominal region. She developed progressive weakness to the point where she was not able to walk. She noticed some numbness and weakness in her right hand. Patient was admitted to the hospital and evaluated for her symptoms. Initial MRI of the brain, cervical and lumbar spine were unremarkable. Follow-up MRI of the cervical and lumbar spine demonstrated abnormal enhancing lesions in the cervical spinal cord from C3-C5, posterior and laterally to the left side. She was treated with IV steroids. Extensive blood work testing and spinal fluid analysis were unremarkable. NMO antibodies and oligoclonal bands were not checked. Symptoms gradually improved and by discharge she was able to use a walker. In January 2015 patient had 30 minutes of blurred vision. She was developing more numbness, right hip pain and stiffness, constipation. She saw Dr. Janann Colonel at [facility] for follow up evaluation, who checked visual evoked potentials which were normal. Now patient developing significant anxiety, muscle spasm, in association with external psychosocial stressors  and family demands.   REVIEW OF SYSTEMS: Full 14 system review of systems performed and notable only for numbness anxiety back pain muscle cramps restless legs gait diff.   ALLERGIES: Allergies  Allergen Reactions  . Antihistamines, Diphenhydramine-Type Palpitations    HOME MEDICATIONS: Outpatient Prescriptions Prior to Visit  Medication Sig Dispense Refill  . Multiple Vitamin (MULTIVITAMIN) capsule Take 1 capsule by mouth daily.      . naproxen sodium (ANAPROX) 220 MG tablet Take 220 mg by mouth 2 (two) times daily with a meal.    . traZODone (DESYREL) 100 MG tablet Take 1 tablet (100 mg total) by mouth at bedtime as needed for sleep. For insomnia. (Patient taking differently: Take 50 mg by mouth at bedtime as needed for sleep. For insomnia.) 90 tablet 3  . baclofen (LIORESAL) 10 MG tablet Take 1 tablet (10 mg total) by mouth 3 (three) times daily as needed for muscle spasms. (Patient not taking: Reported on 06/12/2015) 30 each 3  . dicyclomine (BENTYL) 20 MG tablet Take 1 tablet (20 mg total) by mouth every 6 (six) hours. (Patient not taking: Reported on 05/01/2015) 30 tablet 3   No facility-administered medications prior to visit.    PAST MEDICAL HISTORY: Past Medical History  Diagnosis Date  . Arthritis     cervical,feet  . Depression   . Migraine   . GERD (gastroesophageal reflux disease) 12/2006    esophageal stricture  . Vitamin D deficiency   . MVP (mitral valve prolapse)   . Spondylolisthesis     L4-5  . RLS (restless legs syndrome)   . Anxiety   . Interstitial cystitis   . IBS (irritable bowel syndrome)   .  Insomnia   . DVT (deep vein thrombosis) in pregnancy 1989    postpartum  . Idiopathic transverse myelitis     02/2013    PAST SURGICAL HISTORY: Past Surgical History  Procedure Laterality Date  . Colonoscopy    . Bunionectomy  09,05    both feet  . Upper gastrointestinal endoscopy    . Ear examination under anesthesia      to correct ringing in ear at  [facility]  . Lumbar aspiration and drainage      cyst  . Hammer toe surgery  10/29/2011    Procedure: HAMMER TOE CORRECTION;  Surgeon: Kerin Salen;  Location: [facility];  Service: Orthopedics;  Laterality: Left;  Left 2nd toe duvries arthroplasty  . Decompressive lumbar laminectomy level 4  1999    FAMILY HISTORY: Family History  Problem Relation Age of Onset  . Arthritis Mother     spinal stenosis  . Cataracts Mother   . Heart disease Mother     pacemaker  . Hypertension Father   . Heart disease Father     Quarry manager  . Congestive Heart Failure Father     SOCIAL HISTORY:  History   Social History  . Marital Status: Married    Spouse Name: Gershon Mussel  . Number of Children: 1  . Years of Education: college   Occupational History  . unemployed     former Secretary/administrator   Social History Main Topics  . Smoking status: Never Smoker   . Smokeless tobacco: Never Used  . Alcohol Use: 0.0 oz/week     Comment: occasionally  . Drug Use: No  . Sexual Activity:    Partners: Male    Birth Control/ Protection: Post-menopausal   Other Topics Concern  . Not on file   Social History Narrative   Divorced.   Lives alone.   Adult children live in town.  Her parents are in [place]    Patient is right handed   Education level is some college   Caffeine consumption is a couple of cups a day               PHYSICAL EXAM  Filed Vitals:   06/12/15 0930  BP: 99/68  Pulse: 77  Height: 5\' 8"  (1.727 m)  Weight: 142 lb 12.8 oz (64.774 kg)    Body mass index is 21.72 kg/(m^2).  No exam data present  No flowsheet data found.  GENERAL EXAM: Patient is in no distress; well developed, nourished and groomed; neck is supple  CARDIOVASCULAR: Regular rate and rhythm, no murmurs, no carotid bruits  NEUROLOGIC: MENTAL STATUS: awake, alert, language fluent, comprehension intact, naming intact, fund of knowledge appropriate CRANIAL NERVE: no papilledema on  fundoscopic exam, pupils equal and reactive to light, visual fields full to confrontation, extraocular muscles intact, no nystagmus, facial sensation and strength symmetric, hearing intact, palate elevates symmetrically, uvula midline, shoulder shrug symmetric, tongue midline. MOTOR: normal bulk and tone, full strength in the BUE; BLE (HF 4); NO CLONUS SENSORY: ABSENT VIB AT LEFT TOE; FAINT VIB IN RIGHT TOE COORDINATION: finger-nose-finger, fine finger movements normal REFLEXES: deep tendon reflexes present (2) and symmetric GAIT/STATION: STABLE GAIT, NARROW BASE; SLIGHT LIMP ON LEFT LEG     DIAGNOSTIC DATA (LABS, IMAGING, TESTING) - I reviewed patient records, labs, notes, testing and imaging myself where available.  Lab Results  Component Value Date   WBC 5.0 11/22/2013   HGB 12.5 11/22/2013   HCT 36.4 11/22/2013   MCV  90.3 11/22/2013   PLT 302 11/22/2013      Component Value Date/Time   NA 140 11/22/2013 0956   K 4.3 11/22/2013 0956   CL 103 11/22/2013 0956   CO2 30 11/22/2013 0956   GLUCOSE 74 11/22/2013 0956   BUN 14 11/22/2013 0956   CREATININE 0.67 11/22/2013 0956   CREATININE 0.75 03/08/2013 0615   CALCIUM 9.3 11/22/2013 0956   PROT 6.5 11/22/2013 0956   ALBUMIN 4.0 11/22/2013 0956   AST 23 11/22/2013 0956   ALT 15 11/22/2013 0956   ALKPHOS 81 11/22/2013 0956   BILITOT 0.3 11/22/2013 0956   GFRNONAA 90* 03/08/2013 0615   GFRAA >90 03/08/2013 0615   No results found for: CHOL, HDL, LDLCALC, LDLDIRECT, TRIG, CHOLHDL No results found for: HGBA1C Lab Results  Component Value Date   JOACZYSA63 016 02/23/2013   Lab Results  Component Value Date   TSH 1.001 02/23/2013    02/21/13 CSF labs - WBC 1, RBC 6, glucose 100, protein 32, stain (no organisms), no fungi  02/23/13 HIV nr, RPR nr, anti-ganglioside GM ab IgG < 1:800, anti-GAD ab < 1.0, B12 765, lyme ab negative, ANA neg, TSH 1.001, ceruloplasmin 22 (nl 20-60)  05/28/15 MRI brain [I reviewed images myself and  agree with interpretation. -VRP]  - some scattered small T2/flair hyperintense foci in the subcortical white matter of the frontal lobes. There no acute foci. Compared to an MRI dated 02/20/2013, there is no definite interim change.  05/28/15 MRI cervical [I reviewed images myself and agree with interpretation. -VRP]  1. Multilevel degenerative changes as detailed above. At C5-C6, there is moderate right foraminal narrowing with some encroachment upon the exiting right C6 nerve root and at C6-C7 there is moderate left foraminal narrowing with some apartment upon the exiting left C7 nerve root. However, there is no definite nerve root compression. 2. Compared to the MRI of the cervical spine dated 02/24/2013, there has been resolution of the abnormal signal posteriorly within the spinal cord adjacent to the C3-C5 vertebral bodies.  06/11/15 MRI thoracic spine [I reviewed images myself and agree with interpretation. -VRP]  1. Degenerative changes at T11-T12 where there is anterior disc protrusion and a small node entering the T11 inferior endplate. The changes at this level have progressed when compared to the MRI dated 02/20/2013. 2. Minimal disc bulging at T12-L1, unchanged when compared to the prior MRI. 3. The spinal cord appears normal before and after contrast administration.     ASSESSMENT AND PLAN  64 y.o. year old female here with idiopathic transverse myelitis at C3-C5 in March 2014. Neuro sxs have improved and MRIs are improved as well (2014 --2016).   Dx: idiopathic transverse myelitis (CIS/MS or devic's disease less likely)  PLAN: I spent 25 minutes of face to face time with patient. Greater than 50% of time was spent in counseling and coordination of care with patient. In summary we discussed MRI scan results, prognosis, and further testing options:  - monitor symptoms; may consider repeat LP in future (with oligoclonal band testing which was not done last time) - advised PT  and psychiatry evaluations, but patient feeling better and limited financially - encouraged exercise, stretching, good nutrition  Return in about 6 months (around 12/12/2015).      Penni Bombard, MD 0/09/9322, 55:73 AM Certified in Neurology, Neurophysiology and Neuroimaging  [facility] [address] [place], [place] [address] [phone]

## 2015-07-25 ENCOUNTER — Other Ambulatory Visit: Payer: Self-pay | Admitting: Internal Medicine

## 2015-07-25 ENCOUNTER — Telehealth: Payer: Self-pay | Admitting: Internal Medicine

## 2015-08-27 ENCOUNTER — Other Ambulatory Visit: Payer: Self-pay

## 2015-08-27 DIAGNOSIS — Z1231 Encounter for screening mammogram for malignant neoplasm of breast: Secondary | ICD-10-CM

## 2015-08-31 ENCOUNTER — Ambulatory Visit
Admission: RE | Admit: 2015-08-31 | Discharge: 2015-08-31 | Disposition: A | Discharge status: Home / Self Care | Payer: Medicaid Other | Source: Ambulatory Visit

## 2015-08-31 DIAGNOSIS — Z1231 Encounter for screening mammogram for malignant neoplasm of breast: Secondary | ICD-10-CM

## 2015-09-17 ENCOUNTER — Encounter: Payer: Self-pay | Admitting: Internal Medicine

## 2015-09-17 ENCOUNTER — Ambulatory Visit: Payer: Medicaid Other | Attending: Internal Medicine | Admitting: Internal Medicine

## 2015-09-17 ENCOUNTER — Other Ambulatory Visit: Payer: Self-pay | Admitting: Neurosurgery

## 2015-09-17 ENCOUNTER — Other Ambulatory Visit: Payer: Self-pay | Admitting: Internal Medicine

## 2015-09-17 VITALS — BP 120/78 | HR 78 | Temp 98.6°F | Resp 20 | Ht 68.0 in | Wt 142.2 lb

## 2015-09-17 DIAGNOSIS — M21619 Bunion of unspecified foot: Secondary | ICD-10-CM | POA: Diagnosis not present

## 2015-09-17 DIAGNOSIS — M21612 Bunion of left foot: Secondary | ICD-10-CM | POA: Diagnosis not present

## 2015-09-17 DIAGNOSIS — Z0000 Encounter for general adult medical examination without abnormal findings: Secondary | ICD-10-CM | POA: Diagnosis not present

## 2015-09-17 DIAGNOSIS — G373 Acute transverse myelitis in demyelinating disease of central nervous system: Secondary | ICD-10-CM | POA: Insufficient documentation

## 2015-09-17 DIAGNOSIS — N6320 Unspecified lump in the left breast, unspecified quadrant: Secondary | ICD-10-CM

## 2015-09-17 DIAGNOSIS — M21611 Bunion of right foot: Secondary | ICD-10-CM | POA: Insufficient documentation

## 2015-09-17 DIAGNOSIS — Z1211 Encounter for screening for malignant neoplasm of colon: Secondary | ICD-10-CM

## 2015-09-17 LAB — LIPID PANEL
Cholesterol: 236 mg/dL — ABNORMAL HIGH (ref 125–200)
HDL: 88 mg/dL (ref >=46)
LDL CALC: 130 mg/dL — AB (ref <130)
Total CHOL/HDL Ratio: 2.7 Ratio (ref <=5.0)
Triglycerides: 91 mg/dL (ref <150)
VLDL: 18 mg/dL (ref <30)

## 2015-09-17 LAB — CBC WITH DIFFERENTIAL/PLATELET
BASOS ABS: 0.1 10*3/uL (ref 0.0–0.1)
Basophils Relative: 1 % (ref 0–1)
Eosinophils Absolute: 0.3 10*3/uL (ref 0.0–0.7)
Eosinophils Relative: 5 % (ref 0–5)
HEMATOCRIT: 40.9 % (ref 36.0–46.0)
Hemoglobin: 13.7 g/dL (ref 12.0–15.0)
LYMPHS ABS: 1.8 10*3/uL (ref 0.7–4.0)
Lymphocytes Relative: 29 % (ref 12–46)
MCH: 30.6 pg (ref 26.0–34.0)
MCHC: 33.5 g/dL (ref 30.0–36.0)
MCV: 91.5 fL (ref 78.0–100.0)
MPV: 10.1 fL (ref 8.6–12.4)
Monocytes Absolute: 0.5 10*3/uL (ref 0.1–1.0)
Monocytes Relative: 8 % (ref 3–12)
NEUTROS ABS: 3.5 10*3/uL (ref 1.7–7.7)
Neutrophils Relative %: 57 % (ref 43–77)
Platelets: 304 10*3/uL (ref 150–400)
RBC: 4.47 MIL/uL (ref 3.87–5.11)
RDW: 13.1 % (ref 11.5–15.5)
WBC: 6.1 10*3/uL (ref 4.0–10.5)

## 2015-09-17 LAB — COMPLETE METABOLIC PANEL WITH GFR
ALT: 15 U/L (ref 6–29)
AST: 21 U/L (ref 10–35)
Albumin: 4.4 g/dL (ref 3.6–5.1)
Alkaline Phosphatase: 76 U/L (ref 33–130)
BUN: 15 mg/dL (ref 7–25)
CALCIUM: 9.7 mg/dL (ref 8.6–10.4)
CHLORIDE: 106 mmol/L (ref 98–110)
CO2: 27 mmol/L (ref 20–31)
CREATININE: 0.68 mg/dL (ref 0.50–0.99)
GFR, Est Non African American: >89 mL/min (ref >=60)
Glucose, Bld: 95 mg/dL (ref 65–99)
Potassium: 5.5 mmol/L — ABNORMAL HIGH (ref 3.5–5.3)
Sodium: 140 mmol/L (ref 135–146)
TOTAL PROTEIN: 6.5 g/dL (ref 6.1–8.1)
Total Bilirubin: 0.4 mg/dL (ref 0.2–1.2)

## 2015-09-17 LAB — TSH: TSH: 1.468 u[IU]/mL (ref 0.350–4.500)

## 2015-09-17 LAB — POCT GLYCOSYLATED HEMOGLOBIN (HGB A1C): Hemoglobin A1C: 5.4

## 2015-09-17 MED ORDER — DICYCLOMINE HCL 20 MG PO TABS: 20.0000 mg | ORAL_TABLET | Freq: Three times a day (TID) | ORAL | Status: DC

## 2015-09-17 NOTE — Patient Instructions (Signed)
Transverse Myelitis °Transverse myelitis is a disorder of the spinal cord. Inflammation in the spinal cord can hurt the fatty substance (myelin) that covers the nerve fibers. This would be similar to losing the insulation around electrical wires. This damage causes problems with the nerve cells (neurons) in the spinal cord that carry signals from the brain to the rest of the body. °The part of the spinal cord affected determines which parts of the body are affected. An injury to the spinal cord at the level of the chest can cause problems with leg movement and bowel and bladder control. An injury to the spinal cord at the level of the neck can cause problems with sensation and motor function of the arms, legs, and bowel and bladder control. If an injury is high enough in the neck, it may even affect breathing. °Some patients recover from this disorder with minor or no lasting problems. Others may have lasting problems that make daily living difficult. Most patients will have only 1 episode of transverse myelitis. A small percentage may have problems return. °CAUSES  °The exact cause is not known. Some of the things thought to cause this problem include: °· Viral infections, such as herpes, chickenpox (varicella zoster), cytomegalovirus, and Epstein-Barr virus. °· Abnormal immune reactions. °· Poor blood flow to the spinal cord. °This disorder may also occur as a complication of: °· Syphilis. °· Measles. °· Lyme disease. °· Chickenpox, if severe. °· Some vaccinations, including those for chickenpox and rabies. °SYMPTOMS  °Symptoms can come on over several hours to weeks. Transverse myelitis may cause a loss of spinal cord function over several hours to several weeks. Some symptoms are: °· Headaches. °· Fever. °· Loss of appetite. °· A sudden onset of lower back pain. °· Abnormal sensations in the toes and feet. °· Weakness in the arms and legs. °· Bowel and urinary problems. °DIAGNOSIS  °· Medical history and  neurological exam. °· Magnetic resonance imaging (MRI). This procedure provides a picture of the brain and spinal cord. °· Myelography. This procedure involves injecting a dye and taking X-rays. °· Blood tests may be performed. °· A spinal tap may be done to examine the spinal fluid. °TREATMENT  °No cure exists for this illness. Treatments are designed to help decrease the symptoms. °· Corticosteroid treatment is often used during the first few weeks of illness. This decreases inflammation. °· Pain medicines are prescribed as needed. °· Sometimes, respirators are needed if there is severe difficulty breathing. °· Physical therapy may be used to keep muscles flexible and strong. °· Movement decreases the chances of pressure sores developing. °· Later, if patients begin to recover limb control, physical therapy helps begin to improve muscle strength, coordination, and range of motion. °HOME CARE INSTRUCTIONS  °Much can be done for people with permanent disabilities. °· Treatment programs and other resources are likely available in your community. Medical social workers can help you locate this information. °· Rehabilitative therapy will teach you how to care for yourself. Rehabilitation cannot reverse the physical damage resulting from this illness. However, it can help people, even those with severe paralysis, become as independent as possible. This helps you attain the best possible quality of life. °· Medicines can be prescribed by your caregiver. These medicines can help with depression and adapting to a changed way of life. A wide variety of drugs now exist that can help with the pain from spinal cord injuries. °· Increasing your strength and endurance is possible. Treatment improves coordination and reduces   muscle spasms (spasticity) and muscle wasting in paralyzed limbs. Physical therapists can help you regain greater control over bladder and bowel function through various exercises. They can also teach  paralyzed patients techniques for using assistive devices (wheelchairs, canes, braces) as effectively as possible. °· Learning new ways of performing everyday tasks is important. These tasks include bathing, dressing, preparing a meal, house cleaning, engaging in arts and crafts, or gardening. Occupational therapists can help you with this. °FOR MORE INFORMATION °National Institute of Neurological Disorders and Stroke: www.ninds.nih.gov °Transverse Myelitis Association: www.myelitis.org °American Chronic Pain Association: www.theacpa.org °[place] Project to Cure Paralysis: www.themiamiproject.org °National Rehabilitation Information Center: www.naric.com °Document Released: 11/21/2002 Document Revised: 02/23/2012 Document Reviewed: 02/27/2010 °ExitCare® Patient Information ©2015 [employer] LLC. This information is not intended to replace advice given to you by your health care provider. Make sure you discuss any questions you have with your health care provider. ° °

## 2015-09-17 NOTE — Progress Notes (Signed)
Patient is here for mammogram referral. Patient expressed concerns in left breast while at [facility] appointment. Center stated facility needed referral from Dr in order to complete Diagnostic Testing.  Patient denies any pain at this time.  Patient is not taking Baclofen and Trazadone. Patient has not taken in a week and "does not want to be dependent on it". Patient states she sleeps fine.  Patient would like Bentyl refill sent to [facility] on [address].  Patient refused flu vaccine.

## 2015-09-17 NOTE — Progress Notes (Signed)
Patient ID: Gail Gilbert, female   DOB: January 20, 1951, 64 y.o.   MRN: [id number]   Gail Gilbert, is a 64 y.o. female  PYP:[id number]  IWP:[id number]  DOB - 06/24/1951  Chief Complaint  Patient presents with  . Referral        Subjective:   Gail Gilbert is a 64 y.o. female here today for a follow up visit. Very pleasant woman with history of idiopathic transverse myelitis, major depression and anxiety, restless leg syndrome, spondylolisthesis and GERD, here today for routine follow-up and mammogram referral. Patient has some concern about her left breast, she went to the breast center and they requested a referral from her primary care doctor. Patient denies any other complaint today. Patient did not feel any lump behind some "weird feelings" in the left breast. Her only medication is bentyl for constipation. Patient also has some bunion on her feet bilaterally, and she is requesting referral to podiatrist. Patient has No headache, No chest pain, No abdominal pain - No Nausea, No new weakness tingling or numbness, No Cough - SOB. Patient claims she is doing better with her depression, she is happy most days, she denies any suicidal ideation or thoughts. Patient does not smoke cigarettes.  Problem  Colon Cancer Screening    ALLERGIES: Allergies  Allergen Reactions  . Antihistamines, Diphenhydramine-Type Palpitations    PAST MEDICAL HISTORY: Past Medical History  Diagnosis Date  . Arthritis     cervical,feet  . Depression   . Migraine   . GERD (gastroesophageal reflux disease) 12/2006    esophageal stricture  . Vitamin D deficiency   . MVP (mitral valve prolapse)   . Spondylolisthesis     L4-5  . RLS (restless legs syndrome)   . Anxiety   . Interstitial cystitis   . IBS (irritable bowel syndrome)   . Insomnia   . DVT (deep vein thrombosis) in pregnancy 1989    postpartum  . Idiopathic transverse myelitis ([facility])     02/2013    MEDICATIONS AT HOME: Prior to Admission medications    Medication Sig Start Date End Date Taking? Authorizing Provider  dicyclomine (BENTYL) 20 MG tablet Take 1 tablet (20 mg total) by mouth 4 (four) times daily -  before meals and at bedtime. 09/17/15  Yes Tresa Garter, MD  Multiple Vitamin (MULTIVITAMIN) capsule Take 1 capsule by mouth daily.     Yes Historical Provider, MD  naproxen sodium (ANAPROX) 220 MG tablet Take 220 mg by mouth 2 (two) times daily with a meal.   Yes Historical Provider, MD  baclofen (LIORESAL) 10 MG tablet Take 1 tablet (10 mg total) by mouth 3 (three) times daily as needed for muscle spasms. Patient not taking: Reported on 09/17/2015 05/01/15   Penni Bombard, MD  traZODone (DESYREL) 100 MG tablet Take 1 tablet (100 mg total) by mouth at bedtime as needed for sleep. For insomnia. Patient not taking: Reported on 09/17/2015 04/16/15   Tresa Garter, MD     Objective:   Filed Vitals:   09/17/15 0926  BP: 120/78  Pulse: 78  Temp: 98.6 F (37 C)  TempSrc: Oral  Resp: 20  Height: 5\' 8"  (1.727 m)  Weight: 142 lb 3.2 oz (64.501 kg)  SpO2: 96%    Exam General appearance : Awake, alert, not in any distress. Speech Clear. Not toxic looking HEENT: Atraumatic and Normocephalic, pupils equally reactive to light and accomodation Neck: supple, no JVD. No cervical lymphadenopathy.  Chest:Good air entry bilaterally, no added  sounds  CVS: S1 S2 regular, no murmurs.  Abdomen: Bowel sounds present, Non tender and not distended with no gaurding, rigidity or rebound. Extremities: B/L Lower Ext shows no edema, both legs are warm to touch Neurology: Awake alert, and oriented X 3, CN II-XII intact, Non focal   Data Review No results found for: HGBA1C   Assessment & Plan   1. Idiopathic transverse myelitis (HCC)  - dicyclomine (BENTYL) 20 MG tablet; Take 1 tablet (20 mg total) by mouth 4 (four) times daily -  before meals and at bedtime.  Dispense: 60 tablet; Refill: 3  2. Colon cancer screening  - Ambulatory  referral to Gastroenterology  3. Annual physical exam  - CBC with Differential/Platelet - COMPLETE METABOLIC PANEL WITH GFR - POCT glycosylated hemoglobin (Hb A1C) - Lipid panel - TSH - Vit D  25 hydroxy (rtn osteoporosis monitoring) - MM Digital Diagnostic Bilat; Future  4. Bunion  - Ambulatory referral to Podiatry  Patient have been counseled extensively about nutrition and exercise  Return in about 6 months (around 03/17/2016) for Annual Physical, Routine Follow Up.  The patient was given clear instructions to go to ER or return to medical center if symptoms don't improve, worsen or new problems develop. The patient verbalized understanding. The patient was told to call to get lab results if they haven't heard anything in the next week.   This note has been created with Surveyor, quantity. Any transcriptional errors are unintentional.    Angelica Chessman, MD, [place], Karilyn Cota, [place] and [place], [place]   09/17/2015, 10:37 AM

## 2015-09-18 LAB — VITAMIN D 25 HYDROXY (VIT D DEFICIENCY, FRACTURES): VIT D 25 HYDROXY: 33 ng/mL (ref 30–100)

## 2015-09-19 ENCOUNTER — Encounter: Payer: Self-pay | Admitting: Gastroenterology

## 2015-09-21 ENCOUNTER — Ambulatory Visit
Admission: RE | Admit: 2015-09-21 | Discharge: 2015-09-21 | Disposition: A | Discharge status: Home / Self Care | Payer: Medicaid Other | Source: Ambulatory Visit | Attending: Internal Medicine | Admitting: Internal Medicine

## 2015-09-21 DIAGNOSIS — N6320 Unspecified lump in the left breast, unspecified quadrant: Secondary | ICD-10-CM

## 2015-10-02 ENCOUNTER — Telehealth: Payer: Self-pay | Admitting: *Deleted

## 2015-10-02 NOTE — Telephone Encounter (Signed)
Patient verified DOB Patient made aware of lab results being mostly normal except cholesterol being slightly high. Patient instructed to limit saturated fats to 7% of calories and 200 mg/day of cholesterol. Patient informed to increase fiber and exercise. Patient made aware of cholesterol medication being an option after rechecking labs at patients next visit. Patient also aware of potassium levels being slightly high. Patient had no further questions.

## 2015-10-02 NOTE — Telephone Encounter (Signed)
Patient verified DOB Patient made aware of MM showing no signs of malignancy. Patient informed to have another screening in one year. Patient had no further questions.

## 2015-10-16 ENCOUNTER — Ambulatory Visit (INDEPENDENT_AMBULATORY_CARE_PROVIDER_SITE_OTHER): Payer: Medicaid Other | Admitting: Podiatry

## 2015-10-16 ENCOUNTER — Ambulatory Visit (INDEPENDENT_AMBULATORY_CARE_PROVIDER_SITE_OTHER): Payer: Medicaid Other

## 2015-10-16 ENCOUNTER — Encounter: Payer: Self-pay | Admitting: Podiatry

## 2015-10-16 VITALS — BP 107/69 | HR 74 | Resp 16

## 2015-10-16 DIAGNOSIS — M2040 Other hammer toe(s) (acquired), unspecified foot: Secondary | ICD-10-CM

## 2015-10-16 DIAGNOSIS — M2011 Hallux valgus (acquired), right foot: Secondary | ICD-10-CM

## 2015-10-16 DIAGNOSIS — G373 Acute transverse myelitis in demyelinating disease of central nervous system: Secondary | ICD-10-CM

## 2015-10-16 NOTE — Progress Notes (Signed)
   Subjective:    Patient ID: Gail Gilbert, female    DOB: 06-18-1951, 64 y.o.   MRN: [id number]  HPI she presents today as a 64 year old white female with a history of transverse myelitis since 2014. She relates that she has right-sided weakness in her thigh and leg and has started to develop changes in her toes and mild bunion deformities. States that she has had a bunion procedure performed in the past as well as a hammertoe repair to the second digit of the left foot. She feels that her left foot is starting to pronates severely and she thinks that this is due to compensation from her right weak side. She states that she is unable to wear some shoe gear because she cannot hold them on particularly speaking of flip-flops and sandals.    Review of Systems  Cardiovascular: Positive for palpitations.  Gastrointestinal: Positive for constipation.  Genitourinary: Positive for urgency.  Musculoskeletal: Positive for myalgias and arthralgias.  Neurological: Positive for numbness.  Psychiatric/Behavioral: The patient is nervous/anxious.   All other systems reviewed and are negative.      Objective:   Physical Exam: She presents today vital signs stable alert and oriented 3 pulses are palpable in no acute distress. Neurologic sensorium diminished per Semmes-Weinstein monofilament and muscle strength is diminished bilaterally right greater than left. Orthopedic evaluation does demonstrate more pes planus of the left foot than on the right sublaminar nature. She also has hammertoe deformities 2 through 5 of the right foot with some osteoarthritic changes of the second digit right foot. Left foot has had surgical correction of the second metatarsal phalangeal joint and toe. Otherwise she has hammertoe deformity third left and minimally so to the fourth and fifth left. Radiographs confirm osteoarthritic changes to the toes in question. Pes planus is also noted bilateral left greater than right. Cutaneous  evaluation demonstrates supple well-hydrated cutis no erythema edema cellulitis drainage or odor.        Assessment & Plan:  Assessment: Digital deformities possibly associated with transverse myelitis. She also has pronation of the left foot more so than the right possibly associated with compensation.  Plan: We discussed etiology pathology conservative versus surgical therapies. I did not recommend surgical correction for these deformities due to the etiology being of neurologic origin. I did suggest orthotics to help control pronation which should alleviate some of her symptoms. I expressed to her the digital fusions would not allow her to wear shoes any better than what she can wear them now and also expressed that she would, with digital fusions not be able to wear flip flops or sandals. She would like to talk to her neurologist prior to having orthotics scanned. She will follow-up with [place] on an as-needed basis.  Roselind Messier DPM

## 2015-11-14 ENCOUNTER — Ambulatory Visit (AMBULATORY_SURGERY_CENTER): Payer: Self-pay | Admitting: *Deleted

## 2015-11-14 VITALS — Ht 68.0 in | Wt 140.0 lb

## 2015-11-14 DIAGNOSIS — Z1211 Encounter for screening for malignant neoplasm of colon: Secondary | ICD-10-CM

## 2015-11-14 MED ORDER — NA SULFATE-K SULFATE-MG SULF 17.5-3.13-1.6 GM/177ML PO SOLN: 1.0000 | Freq: Once | ORAL | Status: DC

## 2015-11-14 NOTE — Progress Notes (Signed)
No egg or soy allergy No issues with past sedation No diet pills No home 02 use  Pt had transverse myelitis 2014 and she still has residual problems from this

## 2015-11-26 ENCOUNTER — Encounter: Payer: Self-pay | Admitting: Gastroenterology

## 2015-11-26 ENCOUNTER — Ambulatory Visit (AMBULATORY_SURGERY_CENTER): Payer: Medicaid Other | Admitting: Gastroenterology

## 2015-11-26 VITALS — BP 117/78 | HR 62 | Temp 96.4°F | Resp 17 | Ht 68.0 in | Wt 140.0 lb

## 2015-11-26 DIAGNOSIS — Z1211 Encounter for screening for malignant neoplasm of colon: Secondary | ICD-10-CM

## 2015-11-26 HISTORY — PX: COLONOSCOPY WITH PROPOFOL: SHX5780

## 2015-11-26 MED ORDER — SODIUM CHLORIDE 0.9 % IV SOLN: 500.0000 mL | INTRAVENOUS | Status: DC

## 2015-11-26 NOTE — Patient Instructions (Signed)
Discharge instructions given. Normal exam. Resume previous medications. YOU HAD AN ENDOSCOPIC PROCEDURE TODAY AT THE [facility]:   Refer to the procedure report that was given to you for any specific questions about what was found during the examination.  If the procedure report does not answer your questions, please call your gastroenterologist to clarify.  If you requested that your care partner not be given the details of your procedure findings, then the procedure report has been included in a sealed envelope for you to review at your convenience later.  YOU SHOULD EXPECT: Some feelings of bloating in the abdomen. Passage of more gas than usual.  Walking can help get rid of the air that was put into your GI tract during the procedure and reduce the bloating. If you had a lower endoscopy (such as a colonoscopy or flexible sigmoidoscopy) you may notice spotting of blood in your stool or on the toilet paper. If you underwent a bowel prep for your procedure, you may not have a normal bowel movement for a few days.  Please Note:  You might notice some irritation and congestion in your nose or some drainage.  This is from the oxygen used during your procedure.  There is no need for concern and it should clear up in a day or so.  SYMPTOMS TO REPORT IMMEDIATELY:   Following lower endoscopy (colonoscopy or flexible sigmoidoscopy):  Excessive amounts of blood in the stool  Significant tenderness or worsening of abdominal pains  Swelling of the abdomen that is new, acute  Fever of 100F or higher   For urgent or emergent issues, a gastroenterologist can be reached at any hour by calling [phone].   DIET: Your first meal following the procedure should be a small meal and then it is ok to progress to your normal diet. Heavy or fried foods are harder to digest and may make you feel nauseous or bloated.  Likewise, meals heavy in dairy and vegetables can increase bloating.  Drink plenty  of fluids but you should avoid alcoholic beverages for 24 hours.  ACTIVITY:  You should plan to take it easy for the rest of today and you should NOT DRIVE or use heavy machinery until tomorrow (because of the sedation medicines used during the test).    FOLLOW UP: Our staff will call the number listed on your records the next business day following your procedure to check on you and address any questions or concerns that you may have regarding the information given to you following your procedure. If we do not reach you, we will leave a message.  However, if you are feeling well and you are not experiencing any problems, there is no need to return our call.  We will assume that you have returned to your regular daily activities without incident.  If any biopsies were taken you will be contacted by phone or by letter within the next 1-3 weeks.  Please call us at [phone] if you have not heard about the biopsies in 3 weeks.    SIGNATURES/CONFIDENTIALITY: You and/or your care partner have signed paperwork which will be entered into your electronic medical record.  These signatures attest to the fact that that the information above on your After Visit Summary has been reviewed and is understood.  Full responsibility of the confidentiality of this discharge information lies with you and/or your care-partner. 

## 2015-11-26 NOTE — Op Note (Signed)
[place]  [address] [place], [address]   COLONOSCOPY PROCEDURE REPORT  PATIENT: Gail Gilbert, Gail Gilbert  MR#: [id number] BIRTHDATE: 1951/04/29 , 64  yrs. old GENDER: female ENDOSCOPIST: Harl Bowie, MD REFERRED [id number] Doreene Burke, M.D. PROCEDURE DATE:  11/26/2015 PROCEDURE:   Colonoscopy, screening First Screening Colonoscopy - Avg.  risk and is 50 yrs.  old or older - No.  Prior Negative Screening - Now for repeat screening. 10 or more years since last screening  History of Adenoma - Now for follow-up colonoscopy & has been > or = to 3 yrs.  N/A  Polyps removed today? No Recommend repeat exam, <10 yrs? No ASA CLASS:   Class II INDICATIONS:Screening for colonic neoplasia and Colorectal Neoplasm Risk Assessment for this procedure is average risk. MEDICATIONS: Propofol 250 mg IV  DESCRIPTION OF PROCEDURE:   After the risks benefits and alternatives of the procedure were thoroughly explained, informed consent was obtained.  The digital rectal exam revealed no abnormalities of the rectum.   The LB [id number]  endoscope was introduced through the anus and advanced to the cecum, which was identified by both the appendix and ileocecal valve. No adverse events experienced.   The quality of the prep was good.  The instrument was then slowly withdrawn as the colon was fully examined. Estimated blood loss is zero unless otherwise noted in this procedure report.   COLON FINDINGS: The colonic mucosa appeared normal throughout the entire examined colon.  Retroflexed views revealed no abnormalities. The time to cecum = 4.1 Withdrawal time = 12.4   The scope was withdrawn and the procedure completed. COMPLICATIONS: There were no immediate complications.  ENDOSCOPIC IMPRESSION: The colonic mucosa appeared normal throughout the entire examined colon  RECOMMENDATIONS: You should continue to follow colorectal cancer screening guidelines for "routine risk" patients  with a repeat colonoscopy in 10 years.   eSigned:  Harl Bowie, MD 11/26/2015 9:46 AM

## 2015-11-26 NOTE — Progress Notes (Signed)
Report to PACU, RN, vss, BBS= Clear.  

## 2015-11-26 NOTE — Progress Notes (Signed)
Patient denies any allergies to eggs or soy. 

## 2015-11-27 ENCOUNTER — Telehealth: Payer: Self-pay | Admitting: *Deleted

## 2015-11-27 NOTE — Telephone Encounter (Signed)
Message left

## 2015-12-05 ENCOUNTER — Encounter: Payer: Self-pay | Admitting: Diagnostic Neuroimaging

## 2015-12-05 ENCOUNTER — Ambulatory Visit (INDEPENDENT_AMBULATORY_CARE_PROVIDER_SITE_OTHER): Payer: Medicaid Other | Admitting: Diagnostic Neuroimaging

## 2015-12-05 VITALS — BP 111/69 | HR 80 | Ht 68.0 in | Wt 141.0 lb

## 2015-12-05 DIAGNOSIS — G373 Acute transverse myelitis in demyelinating disease of central nervous system: Secondary | ICD-10-CM

## 2015-12-05 DIAGNOSIS — R202 Paresthesia of skin: Secondary | ICD-10-CM

## 2015-12-05 DIAGNOSIS — R200 Anesthesia of skin: Secondary | ICD-10-CM

## 2015-12-05 NOTE — Patient Instructions (Signed)
Thank you for coming to see [place] at [facility]. I hope we have been able to provide you high quality care today.  You may receive a patient satisfaction survey over the next few weeks. We would appreciate your feedback and comments so that we may continue to improve ourselves and the health of our patients.  - stay active and mobile - try yoga, physical therapy or other exercises   ~~~~~~~~~~~~~~~~~~~~~~~~~~~~~~~~~~~~~~~~~~~~~~~~~~~~~~~~~~~~~~~~~  DR. PENUMALLI'S GUIDE TO HAPPY AND HEALTHY LIVING These are some of my general health and wellness recommendations. Some of them may apply to you better than others. Please use common sense as you try these suggestions and feel free to ask me any questions.   ACTIVITY/FITNESS Mental, social, emotional and physical stimulation are very important for brain and body health. Try learning a new activity (arts, music, language, sports, games).  Keep moving your body to the best of your abilities. You can do this at home, inside or outside, the park, community center, gym or anywhere you like. Consider a physical therapist or personal trainer to get started. Consider the app Sworkit. Fitness trackers such as smart-watches, smart-phones or Fitbits can help as well.   NUTRITION Eat more plants: colorful vegetables, nuts, seeds and berries.  Eat less sugar, salt, preservatives and processed foods.  Avoid toxins such as cigarettes and alcohol.  Drink water when you are thirsty. Warm water with a slice of lemon is an excellent morning drink to start the day.  Consider these websites for more information The Nutrition Source (https://www.henry-hernandez.biz/) Precision Nutrition (WindowBlog.ch)   RELAXATION Consider practicing mindfulness meditation or other relaxation techniques such as deep breathing, prayer, yoga, tai chi, massage. See website mindful.org or the apps Headspace or Calm to  help get started.   SLEEP Try to get at least 7-8+ hours sleep per day. Regular exercise and reduced caffeine will help you sleep better. Practice good sleep hygeine techniques. See website sleep.org for more information.   PLANNING Prepare estate planning, living will, healthcare POA documents. Sometimes this is best planned with the help of an attorney. Theconversationproject.org and agingwithdignity.org are excellent resources.

## 2015-12-05 NOTE — Progress Notes (Signed)
[facility]  PATIENT: Gail Gilbert DOB: [birth year]  REFERRING CLINICIAN: Jegede HISTORY FROM: patient  REASON FOR VISIT: follow up   HISTORICAL  CHIEF COMPLAINT:  Chief Complaint  Patient presents with  . Idiopathic transverse myelitis    rm 7  . Follow-up    6 month    HISTORY OF PRESENT ILLNESS:   UPDATE 12/05/15 (VRP): Since last visit, symptoms are stable. No new events.   UPDATE 06/12/15 (VRP): Since last visit, symptoms stable. MRIs reviewed. No significant progression. C-spine lesion has resolved. Did not try PT, psychiatry or baclofen from last visit. Overall feeling better with mood and physical symptoms.  PRIOR HPI (05/01/15, VRP): 64 year old right-handed female here for evaluation of transverse myelitis. February 2014 patient was in [place], developed low back pain, numbness and tingling in the feet. She came back to [place] and one day had significant increase in numbness in her toes, feet, legs, up to her hips and abdominal region. She developed progressive weakness to the point where she was not able to walk. She noticed some numbness and weakness in her right hand. Patient was admitted to the hospital and evaluated for her symptoms. Initial MRI of the brain, cervical and lumbar spine were unremarkable. Follow-up MRI of the cervical and lumbar spine demonstrated abnormal enhancing lesions in the cervical spinal cord from C3-C5, posterior and laterally to the left side. She was treated with IV steroids. Extensive blood work testing and spinal fluid analysis were unremarkable. NMO antibodies and oligoclonal bands were not checked. Symptoms gradually improved and by discharge she was able to use a walker. In January 2015 patient had 30 minutes of blurred vision. She was developing more numbness, right hip pain and stiffness, constipation. She saw Dr. Janann Colonel at [facility] for follow up evaluation, who checked visual evoked potentials which were normal. Now  patient developing significant anxiety, muscle spasm, in association with external psychosocial stressors and family demands.   REVIEW OF SYSTEMS: Full 14 system review of systems performed and notable only for numbness anxiety back pain muscle cramps restless legs gait diff.   ALLERGIES: Allergies  Allergen Reactions  . Antihistamines, Diphenhydramine-Type Palpitations    HOME MEDICATIONS: Outpatient Prescriptions Prior to Visit  Medication Sig Dispense Refill  . Multiple Vitamin (MULTIVITAMIN) capsule Take 1 capsule by mouth daily.      . naproxen sodium (ANAPROX) 220 MG tablet Take 220 mg by mouth 2 (two) times daily with a meal.    . traZODone (DESYREL) 100 MG tablet Take 1 tablet (100 mg total) by mouth at bedtime as needed for sleep. For insomnia. 90 tablet 3   No facility-administered medications prior to visit.    PAST MEDICAL HISTORY: Past Medical History  Diagnosis Date  . Arthritis     cervical,feet  . Migraine   . GERD (gastroesophageal reflux disease) 12/2006    esophageal stricture  . Vitamin D deficiency   . MVP (mitral valve prolapse)   . Spondylolisthesis     L4-5  . RLS (restless legs syndrome)   . Anxiety   . Interstitial cystitis   . IBS (irritable bowel syndrome)   . Insomnia   . DVT (deep vein thrombosis) in pregnancy 1989    postpartum  . Idiopathic transverse myelitis ([place])     02/2013-cone x 3 weeks   . Depression     pt denies this today 11-14-2015    PAST SURGICAL HISTORY: Past Surgical History  Procedure Laterality Date  . Colonoscopy  11/2015  .  Bunionectomy  09,05    both feet  . Upper gastrointestinal endoscopy    . Ear examination under anesthesia      to correct ringing in ear at [facility]  . Lumbar aspiration and drainage      cyst  . Hammer toe surgery  10/29/2011    Procedure: HAMMER TOE CORRECTION;  Surgeon: Kerin Salen;  Location: [facility];  Service: Orthopedics;  Laterality: Left;  Left 2nd toe duvries  arthroplasty  . Decompressive lumbar laminectomy level 4  1999    FAMILY HISTORY: Family History  Problem Relation Age of Onset  . Arthritis Mother     spinal stenosis  . Cataracts Mother   . Heart disease Mother     pacemaker  . Hypertension Father   . Heart disease Father     Quarry manager  . Congestive Heart Failure Father   . Colon cancer Neg Hx   . Colon polyps Neg Hx   . Rectal cancer Neg Hx   . Stomach cancer Neg Hx   . Esophageal cancer Neg Hx     SOCIAL HISTORY:  Social History   Social History  . Marital Status: Married    Spouse Name: Gershon Mussel  . Number of Children: 1  . Years of Education: college   Occupational History  . unemployed     former Secretary/administrator   Social History Main Topics  . Smoking status: Never Smoker   . Smokeless tobacco: Never Used  . Alcohol Use: 0.0 oz/week    0 Standard drinks or equivalent per week     Comment: occasionally  . Drug Use: No  . Sexual Activity:    Partners: Male    Birth Control/ Protection: Post-menopausal   Other Topics Concern  . Not on file   Social History Narrative   Divorced.   Lives alone.   Adult children live in town.  Her parents are in [place]    Patient is right handed   Education level is some college   Caffeine consumption is a couple of cups a day               PHYSICAL EXAM  Filed Vitals:   12/05/15 1018  BP: 111/69  Pulse: 80  Height: 5\' 8"  (1.727 m)  Weight: 141 lb (63.957 kg)    Body mass index is 21.44 kg/(m^2).  No exam data present  No flowsheet data found.  GENERAL EXAM: Patient is in no distress; well developed, nourished and groomed; neck is supple  CARDIOVASCULAR: Regular rate and rhythm, no murmurs, no carotid bruits  NEUROLOGIC: MENTAL STATUS: awake, alert, language fluent, comprehension intact, naming intact, fund of knowledge appropriate CRANIAL NERVE: no papilledema on fundoscopic exam, pupils equal and reactive to light, visual fields full to  confrontation, extraocular muscles intact, no nystagmus, facial sensation and strength symmetric, hearing intact, palate elevates symmetrically, uvula midline, shoulder shrug symmetric, tongue midline. MOTOR: normal bulk and tone, full strength in the BUE; BLE (HF 4); NO CLONUS SENSORY: INTACT TO LT COORDINATION: finger-nose-finger, fine finger movements normal REFLEXES: deep tendon reflexes present (2) and symmetric GAIT/STATION: STABLE GAIT, NARROW BASE; SLIGHT LIMP ON LEFT LEG; RIGHT HIP DROPS     DIAGNOSTIC DATA (LABS, IMAGING, TESTING) - I reviewed patient records, labs, notes, testing and imaging myself where available.  Lab Results  Component Value Date   WBC 6.1 09/17/2015   HGB 13.7 09/17/2015   HCT 40.9 09/17/2015   MCV 91.5 09/17/2015   PLT  304 09/17/2015      Component Value Date/Time   NA 140 09/17/2015 1040   K 5.5* 09/17/2015 1040   CL 106 09/17/2015 1040   CO2 27 09/17/2015 1040   GLUCOSE 95 09/17/2015 1040   BUN 15 09/17/2015 1040   CREATININE 0.68 09/17/2015 1040   CREATININE 0.75 03/08/2013 0615   CALCIUM 9.7 09/17/2015 1040   PROT 6.5 09/17/2015 1040   ALBUMIN 4.4 09/17/2015 1040   AST 21 09/17/2015 1040   ALT 15 09/17/2015 1040   ALKPHOS 76 09/17/2015 1040   BILITOT 0.4 09/17/2015 1040   GFRNONAA >89 09/17/2015 1040   GFRNONAA 90* 03/08/2013 0615   GFRAA >89 09/17/2015 1040   GFRAA >90 03/08/2013 0615   Lab Results  Component Value Date   CHOL 236* 09/17/2015   HDL 88 09/17/2015   LDLCALC 130* 09/17/2015   TRIG 91 09/17/2015   CHOLHDL 2.7 09/17/2015   Lab Results  Component Value Date   HGBA1C 5.40 09/17/2015   Lab Results  Component Value Date   [id number] 02/23/2013   Lab Results  Component Value Date   TSH 1.468 09/17/2015    02/21/13 CSF labs - WBC 1, RBC 6, glucose 100, protein 32, stain (no organisms), no fungi  02/23/13 HIV nr, RPR nr, anti-ganglioside GM ab IgG < 1:800, anti-GAD ab < 1.0, B12 765, lyme ab negative, ANA  neg, TSH 1.001, ceruloplasmin 22 (nl 20-60)  05/28/15 MRI brain [I reviewed images myself and agree with interpretation. -VRP]  - some scattered small T2/flair hyperintense foci in the subcortical white matter of the frontal lobes. There no acute foci. Compared to an MRI dated 02/20/2013, there is no definite interim change.  05/28/15 MRI cervical [I reviewed images myself and agree with interpretation. -VRP]  1. Multilevel degenerative changes as detailed above. At C5-C6, there is moderate right foraminal narrowing with some encroachment upon the exiting right C6 nerve root and at C6-C7 there is moderate left foraminal narrowing with some apartment upon the exiting left C7 nerve root. However, there is no definite nerve root compression. 2. Compared to the MRI of the cervical spine dated 02/24/2013, there has been resolution of the abnormal signal posteriorly within the spinal cord adjacent to the C3-C5 vertebral bodies.  06/11/15 MRI thoracic spine [I reviewed images myself and agree with interpretation. -VRP]  1. Degenerative changes at T11-T12 where there is anterior disc protrusion and a small node entering the T11 inferior endplate. The changes at this level have progressed when compared to the MRI dated 02/20/2013. 2. Minimal disc bulging at T12-L1, unchanged when compared to the prior MRI. 3. The spinal cord appears normal before and after contrast administration.     ASSESSMENT AND PLAN  64 y.o. year old female here with idiopathic transverse myelitis at C3-C5 in March 2014. Neuro sxs have improved and MRIs are improved as well (2014-2016).   Dx: idiopathic transverse myelitis (CIS/MS or devic's disease less likely)  PLAN: I spent 25 minutes of face to face time with patient. Greater than 50% of time was spent in counseling and coordination of care with patient. In summary we discussed:  - monitor symptoms; may consider repeat LP in future (with oligoclonal band testing which  was not done last time) - advised PT and psychiatry evaluations, but patient feeling better and limited financially - encouraged exercise, stretching, good nutrition  Return if symptoms worsen or fail to improve, for return to PCP.      Penni Bombard,  MD [id number], [id number] AM Certified in Neurology, Neurophysiology and [facility] [address] [place], [place] [address] [phone]

## 2016-01-17 ENCOUNTER — Ambulatory Visit: Payer: Self-pay | Admitting: Internal Medicine

## 2016-01-30 ENCOUNTER — Ambulatory Visit (INDEPENDENT_AMBULATORY_CARE_PROVIDER_SITE_OTHER): Payer: Medicaid Other | Admitting: Diagnostic Neuroimaging

## 2016-01-30 ENCOUNTER — Encounter: Payer: Self-pay | Admitting: Diagnostic Neuroimaging

## 2016-01-30 VITALS — BP 118/71 | HR 97 | Ht 68.0 in | Wt 138.2 lb

## 2016-01-30 DIAGNOSIS — G373 Acute transverse myelitis in demyelinating disease of central nervous system: Secondary | ICD-10-CM | POA: Diagnosis not present

## 2016-01-30 NOTE — Progress Notes (Signed)
[facility]  PATIENT: Gail Gilbert DOB: 07-Nov-1951  REFERRING CLINICIAN: Jegede HISTORY FROM: patient  REASON FOR VISIT: follow up   HISTORICAL  CHIEF COMPLAINT:  Chief Complaint  Patient presents with  . Idoipathic transverse myelitis    rm 7, "increased issues with hip and feet, pins & needles in feet-esp with touching; R hip pain"   . Follow-up    last seen 6 weeks ago    HISTORY OF PRESENT ILLNESS:   UPDATE 01/30/16 :Since last visit, noticing more toe curling, more hammertoes, more problems with right leg. Also with over active bladder at night. Having more problems with hand strength. Trying to work 4 hours, 1 day per week, Scientist, water quality at [employer], but has diff working/standing for longer than that.   UPDATE 12/05/15 (VRP): Since last visit, symptoms are stable. No new events.   UPDATE 06/12/15 (VRP): Since last visit, symptoms stable. MRIs reviewed. No significant progression. C-spine lesion has resolved. Did not try PT, psychiatry or baclofen from last visit. Overall feeling better with mood and physical symptoms.  PRIOR HPI (05/01/15, VRP): 65 year old right-handed female here for evaluation of transverse myelitis. February 2014 patient was in [place], developed low back pain, numbness and tingling in the feet. She came back to [place] and one day had significant increase in numbness in her toes, feet, legs, up to her hips and abdominal region. She developed progressive weakness to the point where she was not able to walk. She noticed some numbness and weakness in her right hand. Patient was admitted to the hospital and evaluated for her symptoms. Initial MRI of the brain, cervical and lumbar spine were unremarkable. Follow-up MRI of the cervical and lumbar spine demonstrated abnormal enhancing lesions in the cervical spinal cord from C3-C5, posterior and laterally to the left side. She was treated with IV steroids. Extensive blood work testing and  spinal fluid analysis were unremarkable. NMO antibodies and oligoclonal bands were not checked. Symptoms gradually improved and by discharge she was able to use a walker. In January 2015 patient had 30 minutes of blurred vision. She was developing more numbness, right hip pain and stiffness, constipation. She saw Dr. Janann Colonel at [facility] for follow up evaluation, who checked visual evoked potentials which were normal. Now patient developing significant anxiety, muscle spasm, in association with external psychosocial stressors and family demands.   REVIEW OF SYSTEMS: Full 14 system review of systems performed and notable only for numbness anxiety back pain muscle cramps restless legs gait diff.   ALLERGIES: Allergies  Allergen Reactions  . Antihistamines, Diphenhydramine-Type Palpitations    HOME MEDICATIONS: Outpatient Prescriptions Prior to Visit  Medication Sig Dispense Refill  . Multiple Vitamin (MULTIVITAMIN) capsule Take 1 capsule by mouth daily.      . naproxen sodium (ANAPROX) 220 MG tablet Take 220 mg by mouth 2 (two) times daily with a meal.    . traZODone (DESYREL) 50 MG tablet Take 50 mg by mouth. As needed     No facility-administered medications prior to visit.    PAST MEDICAL HISTORY: Past Medical History  Diagnosis Date  . Arthritis     cervical,feet  . Migraine   . GERD (gastroesophageal reflux disease) 12/2006    esophageal stricture  . Vitamin D deficiency   . MVP (mitral valve prolapse)   . Spondylolisthesis     L4-5  . RLS (restless legs syndrome)   . Anxiety   . Interstitial cystitis   . IBS (irritable bowel syndrome)   .  Insomnia   . DVT (deep vein thrombosis) in pregnancy 1989    postpartum  . Idiopathic transverse myelitis ([place])     02/2013-cone x 3 weeks   . Depression     pt denies this today 11-14-2015    PAST SURGICAL HISTORY: Past Surgical History  Procedure Laterality Date  . Colonoscopy  11/2015  . Bunionectomy  09,05    both feet  . Upper  gastrointestinal endoscopy    . Ear examination under anesthesia      to correct ringing in ear at [facility]  . Lumbar aspiration and drainage      cyst  . Hammer toe surgery  10/29/2011    Procedure: HAMMER TOE CORRECTION;  Surgeon: Kerin Salen;  Location: [facility];  Service: Orthopedics;  Laterality: Left;  Left 2nd toe duvries arthroplasty  . Decompressive lumbar laminectomy level 4  1999    FAMILY HISTORY: Family History  Problem Relation Age of Onset  . Arthritis Mother     spinal stenosis  . Cataracts Mother   . Heart disease Mother     pacemaker  . Hypertension Father   . Heart disease Father     Quarry manager  . Congestive Heart Failure Father   . Colon cancer Neg Hx   . Colon polyps Neg Hx   . Rectal cancer Neg Hx   . Stomach cancer Neg Hx   . Esophageal cancer Neg Hx     SOCIAL HISTORY:  Social History   Social History  . Marital Status: Married    Spouse Name: Gershon Mussel  . Number of Children: 1  . Years of Education: college   Occupational History  . unemployed     former Secretary/administrator   Social History Main Topics  . Smoking status: Never Smoker   . Smokeless tobacco: Never Used  . Alcohol Use: 0.0 oz/week    0 Standard drinks or equivalent per week     Comment: occasionally  . Drug Use: No  . Sexual Activity:    Partners: Male    Birth Control/ Protection: Post-menopausal   Other Topics Concern  . Not on file   Social History Narrative   Divorced.   Lives alone.   Adult children live in town.  Her parents are in [place]    Patient is right handed   Education level is some college   Caffeine consumption is a couple of cups a day               PHYSICAL EXAM  Filed Vitals:   01/30/16 1453  BP: 118/71  Pulse: 97  Height: 5\' 8"  (1.727 m)  Weight: 138 lb 3.2 oz (62.687 kg)    Body mass index is 21.02 kg/(m^2).  No exam data present  No flowsheet data found.  GENERAL EXAM: Patient is in no distress; well  developed, nourished and groomed; neck is supple  CARDIOVASCULAR: Regular rate and rhythm, no murmurs, no carotid bruits  NEUROLOGIC: MENTAL STATUS: awake, alert, language fluent, comprehension intact, naming intact, fund of knowledge appropriate CRANIAL NERVE: pupils equal and reactive to light, visual fields full to confrontation, extraocular muscles intact, no nystagmus, facial sensation and strength symmetric, hearing intact, palate elevates symmetrically, uvula midline, shoulder shrug symmetric, tongue midline. MOTOR: normal bulk and tone, full strength in the BUE; BLE 4; NO CLONUS SENSORY: INTACT TO LT COORDINATION: finger-nose-finger, fine finger movements SLOW BILATERALLY REFLEXES: deep tendon reflexes BUE 2; RIGHT KNEE 3; LEFT KNEE 2; ANKLES 2  GAIT/STATION: STABLE GAIT, NARROW BASE; SLIGHT LIMP ON LEFT LEG; RIGHT HIP DROPS; HAMMERTOES ON RIGHT FOOT     DIAGNOSTIC DATA (LABS, IMAGING, TESTING) - I reviewed patient records, labs, notes, testing and imaging myself where available.  Lab Results  Component Value Date   WBC 6.1 09/17/2015   HGB 13.7 09/17/2015   HCT 40.9 09/17/2015   MCV 91.5 09/17/2015   PLT 304 09/17/2015      Component Value Date/Time   NA 140 09/17/2015 1040   K 5.5* 09/17/2015 1040   CL 106 09/17/2015 1040   CO2 27 09/17/2015 1040   GLUCOSE 95 09/17/2015 1040   BUN 15 09/17/2015 1040   CREATININE 0.68 09/17/2015 1040   CREATININE 0.75 03/08/2013 0615   CALCIUM 9.7 09/17/2015 1040   PROT 6.5 09/17/2015 1040   ALBUMIN 4.4 09/17/2015 1040   AST 21 09/17/2015 1040   ALT 15 09/17/2015 1040   ALKPHOS 76 09/17/2015 1040   BILITOT 0.4 09/17/2015 1040   GFRNONAA >89 09/17/2015 1040   GFRNONAA 90* 03/08/2013 0615   GFRAA >89 09/17/2015 1040   GFRAA >90 03/08/2013 0615   Lab Results  Component Value Date   CHOL 236* 09/17/2015   HDL 88 09/17/2015   LDLCALC 130* 09/17/2015   TRIG 91 09/17/2015   CHOLHDL 2.7 09/17/2015   Lab Results  Component  Value Date   HGBA1C 5.40 09/17/2015   Lab Results  Component Value Date   [id number] 02/23/2013   Lab Results  Component Value Date   TSH 1.468 09/17/2015    02/21/13 CSF labs - WBC 1, RBC 6, glucose 100, protein 32, stain (no organisms), no fungi  02/23/13 HIV nr, RPR nr, anti-ganglioside GM ab IgG < 1:800, anti-GAD ab < 1.0, B12 765, lyme ab negative, ANA neg, TSH 1.001, ceruloplasmin 22 (nl 20-60)  05/28/15 MRI brain [I reviewed images myself and agree with interpretation. -VRP]  - some scattered small T2/flair hyperintense foci in the subcortical white matter of the frontal lobes. There no acute foci. Compared to an MRI dated 02/20/2013, there is no definite interim change.  05/28/15 MRI cervical [I reviewed images myself and agree with interpretation. -VRP]  1. Multilevel degenerative changes as detailed above. At C5-C6, there is moderate right foraminal narrowing with some encroachment upon the exiting right C6 nerve root and at C6-C7 there is moderate left foraminal narrowing with some apartment upon the exiting left C7 nerve root. However, there is no definite nerve root compression. 2. Compared to the MRI of the cervical spine dated 02/24/2013, there has been resolution of the abnormal signal posteriorly within the spinal cord adjacent to the C3-C5 vertebral bodies.  06/11/15 MRI thoracic spine [I reviewed images myself and agree with interpretation. -VRP]  1. Degenerative changes at T11-T12 where there is anterior disc protrusion and a small node entering the T11 inferior endplate. The changes at this level have progressed when compared to the MRI dated 02/20/2013. 2. Minimal disc bulging at T12-L1, unchanged when compared to the prior MRI. 3. The spinal cord appears normal before and after contrast administration.     ASSESSMENT AND PLAN  65 y.o. year old female here with idiopathic transverse myelitis at C3-C5 in March 2014. Neuro sxs have improved and MRIs are  improved as well (2014-2016). Still with bilateral hand and right leg weakness/spasm issues.   Dx: idiopathic transverse myelitis (CIS/MS or devic's disease less likely)   PLAN: - monitor symptoms; may consider repeat LP in future (with oligoclonal  band testing which was not done last time) - advised PT and psychiatry evaluations, but patient limited financially - encouraged exercise, stretching, good nutrition  Return in about 6 months (around 07/29/2016).      Penni Bombard, MD [id number], [id number] PM Certified in Neurology, Neurophysiology and Neuroimaging  [facility] [address], [place], [place] [address] [phone]

## 2016-01-30 NOTE — Patient Instructions (Signed)

## 2016-02-07 ENCOUNTER — Ambulatory Visit: Payer: Self-pay | Admitting: Internal Medicine

## 2016-03-27 ENCOUNTER — Encounter: Payer: Self-pay | Admitting: Internal Medicine

## 2016-03-27 ENCOUNTER — Ambulatory Visit: Payer: Medicaid Other | Attending: Internal Medicine | Admitting: Internal Medicine

## 2016-03-27 VITALS — BP 106/65 | HR 73 | Temp 98.1°F | Resp 18 | Ht 68.0 in | Wt 136.8 lb

## 2016-03-27 DIAGNOSIS — G2581 Restless legs syndrome: Secondary | ICD-10-CM | POA: Diagnosis not present

## 2016-03-27 DIAGNOSIS — K219 Gastro-esophageal reflux disease without esophagitis: Secondary | ICD-10-CM | POA: Diagnosis not present

## 2016-03-27 DIAGNOSIS — E559 Vitamin D deficiency, unspecified: Secondary | ICD-10-CM | POA: Diagnosis not present

## 2016-03-27 DIAGNOSIS — M4316 Spondylolisthesis, lumbar region: Secondary | ICD-10-CM | POA: Insufficient documentation

## 2016-03-27 DIAGNOSIS — F419 Anxiety disorder, unspecified: Secondary | ICD-10-CM | POA: Insufficient documentation

## 2016-03-27 DIAGNOSIS — K589 Irritable bowel syndrome without diarrhea: Secondary | ICD-10-CM | POA: Diagnosis not present

## 2016-03-27 DIAGNOSIS — G43909 Migraine, unspecified, not intractable, without status migrainosus: Secondary | ICD-10-CM | POA: Diagnosis not present

## 2016-03-27 DIAGNOSIS — Z86718 Personal history of other venous thrombosis and embolism: Secondary | ICD-10-CM | POA: Insufficient documentation

## 2016-03-27 DIAGNOSIS — M79673 Pain in unspecified foot: Secondary | ICD-10-CM | POA: Diagnosis present

## 2016-03-27 DIAGNOSIS — G373 Acute transverse myelitis in demyelinating disease of central nervous system: Secondary | ICD-10-CM | POA: Insufficient documentation

## 2016-03-27 DIAGNOSIS — F329 Major depressive disorder, single episode, unspecified: Secondary | ICD-10-CM | POA: Diagnosis not present

## 2016-03-27 DIAGNOSIS — M25551 Pain in right hip: Secondary | ICD-10-CM

## 2016-03-27 DIAGNOSIS — M1990 Unspecified osteoarthritis, unspecified site: Secondary | ICD-10-CM | POA: Diagnosis not present

## 2016-03-27 DIAGNOSIS — M545 Low back pain, unspecified: Secondary | ICD-10-CM | POA: Insufficient documentation

## 2016-03-27 DIAGNOSIS — I341 Nonrheumatic mitral (valve) prolapse: Secondary | ICD-10-CM | POA: Diagnosis not present

## 2016-03-27 NOTE — Progress Notes (Signed)
Patient ID: Gail Gilbert, female   DOB: May 24, 1951, 65 y.o.   MRN: [id number]   Gail Gilbert, is a 65 y.o. female  [id number]  [id number]  DOB - July 30, 1951  Chief Complaint  Patient presents with  . Foot Pain        Subjective:   Gail Gilbert is a 65 y.o. female with history of idiopathic transverse myelitis, major depression and anxiety, restless leg syndrome, spondylolisthesis and GERD here today for a follow up visit. Major complaint today is low back pain that started suddenly about one half weeks ago and has been on and off since then. The past 2 days were somewhat pain-free but she woke up this morning with severe low back pain radiating to her right thigh and right foot affecting her gait and stability. She begins to think that her comorbidities are affecting her activities of daily living, she has to call out from work twice in the last 2 weeks because she was unsure of her safety walking. She does not drink alcohol, she does not smoke cigarette. She claims her depression is as a result of transverse myelitis because so many things showing up that has no solution. She was seen by the podiatrist and no solution was provided for her recently developing hammertoes on the right foot. She will like to have a second opinion with an orthopedic surgeon. She has no other complaints today, she declines to be on any medications for now. Patient has No headache, No chest pain, No abdominal pain - No Nausea, No Cough - SOB.  Problem  Acute Low Back Pain    ALLERGIES: Allergies  Allergen Reactions  . Antihistamines, Diphenhydramine-Type Palpitations    PAST MEDICAL HISTORY: Past Medical History  Diagnosis Date  . Arthritis     cervical,feet  . Migraine   . GERD (gastroesophageal reflux disease) 12/2006    esophageal stricture  . Vitamin D deficiency   . MVP (mitral valve prolapse)   . Spondylolisthesis     L4-5  . RLS (restless legs syndrome)   . Anxiety   . Interstitial  cystitis   . IBS (irritable bowel syndrome)   . Insomnia   . DVT (deep vein thrombosis) in pregnancy 1989    postpartum  . Idiopathic transverse myelitis ([place])     02/2013-cone x 3 weeks   . Depression     pt denies this today 11-14-2015    MEDICATIONS AT HOME: Prior to Admission medications   Medication Sig Start Date End Date Taking? Authorizing Provider  Multiple Vitamin (MULTIVITAMIN) capsule Take 1 capsule by mouth daily.     Yes Historical Provider, MD  naproxen sodium (ANAPROX) 220 MG tablet Take 220 mg by mouth 2 (two) times daily with a meal.   Yes Historical Provider, MD  traZODone (DESYREL) 50 MG tablet Take 50 mg by mouth. As needed   Yes Historical Provider, MD     Objective:   Filed Vitals:   03/27/16 0955  BP: 106/65  Pulse: 73  Temp: 98.1 F (36.7 C)  TempSrc: Oral  Resp: 18  Height: 5\' 8"  (1.727 m)  Weight: 136 lb 12.8 oz (62.052 kg)  SpO2: 96%    Exam General appearance : Awake, alert, not in any distress. Speech Clear. Not toxic looking HEENT: Atraumatic and Normocephalic, pupils equally reactive to light and accomodation Neck: supple, no JVD. No cervical lymphadenopathy.  Chest:Good air entry bilaterally, no added sounds  CVS: S1 S2 regular, no murmurs.  Abdomen: Bowel sounds present,  Non tender and not distended with no gaurding, rigidity or rebound. Extremities: Hammertoe right 3rd and 4th toes, Genu Valgus left side, B/L Lower Ext shows no edema, both legs are warm to touch Neurology: Awake alert, and oriented X 3, CN II-XII intact, Non focal Skin:No Rash  Data Review Lab Results  Component Value Date   HGBA1C 5.40 09/17/2015     Assessment & Plan   1. Idiopathic transverse myelitis ([place])  - MR Lumbar Spine Wo Contrast; Future  2. Hip pain, right  - MR Lumbar Spine Wo Contrast; Future - Ambulatory referral to Orthopedic Surgery  3. Acute low back pain  - MR Lumbar Spine Wo Contrast; Future  Patient have been counseled  extensively about nutrition and exercise  Return in about 6 months (around 09/26/2016) for Follow up Pain and comorbidities, Transverse Myelitis.  The patient was given clear instructions to go to ER or return to medical center if symptoms don't improve, worsen or new problems develop. The patient verbalized understanding. The patient was told to call to get lab results if they haven't heard anything in the next week.   This note has been created with Surveyor, quantity. Any transcriptional errors are unintentional.    Angelica Chessman, MD, [place], Karilyn Cota, [place] and [place], [place]   03/27/2016, 10:41 AM

## 2016-03-27 NOTE — Patient Instructions (Signed)
Transverse Myelitis Transverse myelitis is a disorder of the spinal cord. Inflammation in the spinal cord can hurt the fatty substance (myelin) that covers the nerve fibers. This would be similar to losing the insulation around electrical wires. This damage causes problems with the nerve cells (neurons) in the spinal cord that carry signals from the brain to the rest of the body. The part of the spinal cord affected determines which parts of the body are affected. An injury to the spinal cord at the level of the chest can cause problems with leg movement and bowel and bladder control. An injury to the spinal cord at the level of the neck can cause problems with sensation and motor function of the arms, legs, and bowel and bladder control. If an injury is high enough in the neck, it may even affect breathing. Some patients recover from this disorder with minor or no lasting problems. Others may have lasting problems that make daily living difficult. Most patients will have only 1 episode of transverse myelitis. A small percentage may have problems return. CAUSES  The exact cause is not known. Some of the things thought to cause this problem include:  Viral infections, such as herpes, chickenpox (varicella zoster), cytomegalovirus, and Epstein-Barr virus.  Abnormal immune reactions.  Poor blood flow to the spinal cord. This disorder may also occur as a complication of:  Syphilis.  Measles.  Lyme disease.  Chickenpox, if severe.  Some vaccinations, including those for chickenpox and rabies. SYMPTOMS  Symptoms can come on over several hours to weeks. Transverse myelitis may cause a loss of spinal cord function over several hours to several weeks. Some symptoms are:  Headaches.  Fever.  Loss of appetite.  A sudden onset of lower back pain.  Abnormal sensations in the toes and feet.  Weakness in the arms and legs.  Bowel and urinary problems. DIAGNOSIS   Medical history and  neurological exam.  Magnetic resonance imaging (MRI). This procedure provides a picture of the brain and spinal cord.  Myelography. This procedure involves injecting a dye and taking X-rays.  Blood tests may be performed.  A spinal tap may be done to examine the spinal fluid. TREATMENT  No cure exists for this illness. Treatments are designed to help decrease the symptoms.  Corticosteroid treatment is often used during the first few weeks of illness. This decreases inflammation.  Pain medicines are prescribed as needed.  Sometimes, respirators are needed if there is severe difficulty breathing.  Physical therapy may be used to keep muscles flexible and strong.  Movement decreases the chances of pressure sores developing.  Later, if patients begin to recover limb control, physical therapy helps begin to improve muscle strength, coordination, and range of motion. HOME CARE INSTRUCTIONS  Much can be done for people with permanent disabilities.  Treatment programs and other resources are likely available in your community. Medical social workers can help you locate this information.  Rehabilitative therapy will teach you how to care for yourself. Rehabilitation cannot reverse the physical damage resulting from this illness. However, it can help people, even those with severe paralysis, become as independent as possible. This helps you attain the best possible quality of life.  Medicines can be prescribed by your caregiver. These medicines can help with depression and adapting to a changed way of life. A wide variety of drugs now exist that can help with the pain from spinal cord injuries.  Increasing your strength and endurance is possible. Treatment improves coordination and reduces  muscle spasms (spasticity) and muscle wasting in paralyzed limbs. Physical therapists can help you regain greater control over bladder and bowel function through various exercises. They can also teach  paralyzed patients techniques for using assistive devices (wheelchairs, canes, braces) as effectively as possible.  Learning new ways of performing everyday tasks is important. These tasks include bathing, dressing, preparing a meal, house cleaning, engaging in arts and crafts, or gardening. Occupational therapists can help you with this. [place] of Neurological Disorders and Stroke: MasterBoxes.it Transverse Myelitis Association: www.myelitis.org American Chronic Pain Association: www.theacpa.[place] to Cure Paralysis: www.themiamiproject.[place]: ForexFest.com.pt   This information is not intended to replace advice given to you by your health care provider. Make sure you discuss any questions you have with your health care provider.   Document Released: 11/21/2002 Document Revised: 02/23/2012 Document Reviewed: 05/09/2015 Elsevier Interactive Patient Education [employer].

## 2016-03-27 NOTE — Progress Notes (Signed)
Patient is here for Foot Pain  Patient complains of bilateral foot pain.   Patient complains of chronic lower back and right sided hip pain. Pain is currently scaled at a 5.  Patient has eaten and taken medication today.

## 2016-04-04 ENCOUNTER — Encounter: Payer: Self-pay | Admitting: Clinical

## 2016-04-04 NOTE — Progress Notes (Signed)
Depression screen [facility] 2/9 03/27/2016 09/17/2015 03/12/2015 11/22/2013 08/23/2013  Decreased Interest 0 0 0 0 -  Down, Depressed, Hopeless 1 0 0 0 2  PHQ - 2 Score 1 0 0 0 2  Altered sleeping 0 - - - -  Tired, decreased energy 0 - - - -  Change in appetite 0 - - - -  Feeling bad or failure about yourself  0 - - - -  Trouble concentrating 0 - - - -  Moving slowly or fidgety/restless 0 - - - -  Suicidal thoughts 0 - - - -  PHQ-9 Score 1 - - - -   GAD 7 : Generalized Anxiety Score 04/04/2016  Nervous, Anxious, on Edge 1  Control/stop worrying 1  Worry too much - different things 1  Trouble relaxing 1  Restless 0  Easily annoyed or irritable 1  Afraid - awful might happen 0  Total GAD 7 Score 5   *Last GAD7 from 03-27-16 *Pt notes felling down, depressed or hopeless "due to back pain" *Pt notes trouble relaxing to "muscle spasmes" *Pt notes becoming easily annoyed or irritable "due to pain"

## 2016-04-07 ENCOUNTER — Ambulatory Visit (HOSPITAL_COMMUNITY)
Admission: RE | Admit: 2016-04-07 | Discharge: 2016-04-07 | Disposition: A | Discharge status: Home / Self Care | Payer: Medicaid Other | Source: Ambulatory Visit | Attending: Internal Medicine | Admitting: Internal Medicine

## 2016-04-07 DIAGNOSIS — G373 Acute transverse myelitis in demyelinating disease of central nervous system: Secondary | ICD-10-CM | POA: Diagnosis not present

## 2016-04-07 DIAGNOSIS — M25551 Pain in right hip: Secondary | ICD-10-CM | POA: Diagnosis not present

## 2016-04-07 DIAGNOSIS — M47896 Other spondylosis, lumbar region: Secondary | ICD-10-CM | POA: Insufficient documentation

## 2016-04-07 DIAGNOSIS — M545 Low back pain, unspecified: Secondary | ICD-10-CM

## 2016-04-07 DIAGNOSIS — M4186 Other forms of scoliosis, lumbar region: Secondary | ICD-10-CM | POA: Diagnosis not present

## 2016-04-07 DIAGNOSIS — M2578 Osteophyte, vertebrae: Secondary | ICD-10-CM | POA: Diagnosis not present

## 2016-04-11 ENCOUNTER — Ambulatory Visit: Payer: Medicaid Other | Admitting: Family Medicine

## 2016-04-15 ENCOUNTER — Telehealth: Payer: Self-pay | Admitting: *Deleted

## 2016-04-15 NOTE — Telephone Encounter (Signed)
-----   Message from Tresa Garter, MD sent at 04/09/2016 10:45 AM EDT ----- Please inform patient that her lumbar spine MRI showed Progression of multilevel superimposed degenerative changes most prominent L4-5 level followed by the L3-4 level, which means worsening osteoarthritis. Please follow up with orthopedic surgery as referred.

## 2016-04-15 NOTE — Telephone Encounter (Signed)
Medical Assistant left message on patient's home and cell voicemail. Voicemail states to give a call back to Nubia with [facility] at [phone].  

## 2016-04-16 ENCOUNTER — Ambulatory Visit
Admission: RE | Admit: 2016-04-16 | Discharge: 2016-04-16 | Disposition: A | Discharge status: Home / Self Care | Payer: Medicaid Other | Source: Ambulatory Visit | Attending: Family Medicine | Admitting: Family Medicine

## 2016-04-16 ENCOUNTER — Encounter: Payer: Self-pay | Admitting: Family Medicine

## 2016-04-16 ENCOUNTER — Ambulatory Visit (INDEPENDENT_AMBULATORY_CARE_PROVIDER_SITE_OTHER): Payer: Medicaid Other | Admitting: Family Medicine

## 2016-04-16 ENCOUNTER — Other Ambulatory Visit: Payer: Self-pay | Admitting: Family Medicine

## 2016-04-16 VITALS — BP 110/70 | Ht 68.0 in | Wt 137.0 lb

## 2016-04-16 DIAGNOSIS — M4316 Spondylolisthesis, lumbar region: Secondary | ICD-10-CM

## 2016-04-16 DIAGNOSIS — M2040 Other hammer toe(s) (acquired), unspecified foot: Secondary | ICD-10-CM

## 2016-04-16 DIAGNOSIS — M25551 Pain in right hip: Secondary | ICD-10-CM | POA: Diagnosis present

## 2016-04-16 NOTE — Telephone Encounter (Signed)
Pt. Returned call. Please f/u with pt. °

## 2016-04-19 ENCOUNTER — Other Ambulatory Visit: Payer: Self-pay | Admitting: Internal Medicine

## 2016-04-21 ENCOUNTER — Encounter: Payer: Self-pay | Admitting: Family Medicine

## 2016-04-21 NOTE — Progress Notes (Signed)
Gail Gilbert - 65 y.o. female MRN [id number]  Date of birth: 1951/02/15  CC: chronic hip and foot pain, right sided  SUBJECTIVE:   HPI Gail Gilbert is a very pleasant 65 yo female with a history of spondylolisthesis & history of transverse myelitis (01/2013) who presents for exacerbation of  chronic right hip pain and  low back pain:  Low Back pain: - hx of spondylolistheis, L4/L5 - History of synovial cyst removal in [place] in the 90s. No residual pain since that time. Decompressive lumbar laminectomy as well in 1999 - Occasionally radiates to right hip and foot, but more commonly these issues are isolated.  - Chronic RLE weakness since transverse myelitis  Right hip pain:  - Since 2014, but exacerbated last year and resolved.  - Again worsened over the last 2 months - Right hip weakness since transverse myelitis in 2014 - Has had numerous bursa injections in the past that have helped transiently.   Right foot pain/hammer toes:  - History of b/l bunionectomy in '09 & '05. Hx of hammer toe surgery on the left in '12 - Tries wearing cloth topped shoes.  - Here for f/u of this issue.   All of this is very depressing  ROS:     As above, no fevers, chills, or night sweats.  No headache. No unexplained weight loss  HISTORY: Past Medical, Surgical, Social, and Family History Reviewed & Updated per EMR.  Pertinent Historical Findings include: Hx of transverse myelitis Spondylolisthesis with low back pain Restless leg syndrome depression  DATA REVIEW:  03/2016 MRI. Scoliosis, multiplevel degenerative changes, most prominent at L4-5 as well as L3-4. 1.2cm anterolisthesis of L4 leading w/o spinal stenosis, although b/l lateral recess stenosis and history of laminectomy.     OBJECTIVE: BP 110/70 mmHg  Ht 5\' 8"  (1.727 m)  Wt 137 lb (62.143 kg)  BMI 20.84 kg/m2  Physical Exam  Calm, NAD Non-labored breathing  Back Exam: Inspection: scoliotic curve Motion: limited in all direction  due to pain. Minimal pain with extension.  SLR seated: negative                        SLR lying: negative XSLR seated: negative                     XSLR lying: negative Palpable tenderness: FABER: negative for SI tenderness Sensory change: none Reflex change: symmetric  Gait Walking:milding antalgic  Hip: right ROM wnl Strength On the right: IR: 4+/5, ER: 4+/5, Flexion: 4+/5, Extension: 4+/5, Abduction: 3+/5, Adduction: 4/5 Pelvic alignment unremarkable to inspection and palpation. Markedly + trendlenberg, R>L Glute med insertion tenderness on the right No SI joint tenderness and normal minimal SI movement.  On b/l foot patient has corrected hallux valgus deformity of great toe and first MT: there is associated loss of longitudinal arch and pes planus: Loss of transverse arch with breakdown of MTs and subluxation through the capsule ; bunionette present. + hammertoeing   MEDICATIONS, LABS & OTHER ORDERS: Previous Medications   MULTIPLE VITAMIN (MULTIVITAMIN) CAPSULE    Take 1 capsule by mouth daily.     NAPROXEN SODIUM (ANAPROX) 220 MG TABLET    Take 220 mg by mouth 2 (two) times daily with a meal.   TRAZODONE (DESYREL) 50 MG TABLET    Take 50 mg by mouth. As needed   Modified Medications   Modified Medication Previous Medication   TRAZODONE (DESYREL) 100 MG TABLET traZODone (  DESYREL) 100 MG tablet      TAKE 1 TABLET (100 MG TOTAL) BY MOUTH AT BEDTIME AS NEEDED FOR SLEEP/INSOMNIA.    Take 1 tablet (100 mg total) by mouth at bedtime as needed for sleep. For insomnia.   New Prescriptions   No medications on file   Discontinued Medications   No medications on file   Orders Placed This Encounter  Procedures  . DG Lumbar Spine 2-3 Views   ASSESSMENT & PLAN: Gluteus medius tendinopathy: Appears to have residual effects from transverse myelitis. 2 injections with transient relief.  Will provide pelvic stabilizer exercises.  We can consider hip MRI/ultrasound in the future if  refractory to rehab.  Rehab potential unknown considering neuro insult.    Chronic back pain: multilevel degenerative changes. Not typical presentation of radiculopathy despite multilevel degenerative changes on MRI.  Will get XR with flex/ext to better assess stability.  May consider epidural injection in the future.    Chronic foot pain: Status post bunionectomy b/l with 1 hammer toe surgery.  Provided hammertoe strap, but 2nd toe is relatively rigid and expect this will provide minimal relief. Advised soft roof of shoe for hammer toe.  Also will need orthotics for morton's changes which we will have her f/u for in 2 weeks.

## 2016-04-25 ENCOUNTER — Encounter (HOSPITAL_COMMUNITY): Payer: Self-pay | Admitting: *Deleted

## 2016-04-25 ENCOUNTER — Ambulatory Visit (HOSPITAL_COMMUNITY)
Admission: EM | Admit: 2016-04-25 | Discharge: 2016-04-25 | Disposition: A | Discharge status: Home / Self Care | Payer: Medicaid Other | Attending: Emergency Medicine | Admitting: Emergency Medicine

## 2016-04-25 DIAGNOSIS — Z79899 Other long term (current) drug therapy: Secondary | ICD-10-CM | POA: Diagnosis not present

## 2016-04-25 DIAGNOSIS — N3090 Cystitis, unspecified without hematuria: Secondary | ICD-10-CM | POA: Insufficient documentation

## 2016-04-25 DIAGNOSIS — Z888 Allergy status to other drugs, medicaments and biological substances status: Secondary | ICD-10-CM | POA: Insufficient documentation

## 2016-04-25 DIAGNOSIS — N390 Urinary tract infection, site not specified: Secondary | ICD-10-CM | POA: Diagnosis present

## 2016-04-25 LAB — POCT URINALYSIS DIP (DEVICE)
BILIRUBIN URINE: NEGATIVE
GLUCOSE, UA: NEGATIVE mg/dL
KETONES UR: NEGATIVE mg/dL
Leukocytes, UA: NEGATIVE
NITRITE: NEGATIVE
PROTEIN: NEGATIVE mg/dL
Specific Gravity, Urine: 1.015 (ref 1.005–1.030)
Urobilinogen, UA: 0.2 mg/dL (ref 0.0–1.0)
pH: 6 (ref 5.0–8.0)

## 2016-04-25 MED ORDER — PHENAZOPYRIDINE HCL 200 MG PO TABS: 200.0000 mg | ORAL_TABLET | Freq: Three times a day (TID) | ORAL | Status: DC

## 2016-04-25 NOTE — ED Provider Notes (Signed)
CSN: [id number]     Arrival date & time 04/25/16  1256 History   First MD Initiated Contact with Patient 04/25/16 1311     Chief Complaint  Patient presents with  . Urinary Tract Infection   (Consider location/radiation/quality/duration/timing/severity/associated sxs/prior Treatment) HPI He is a 65 year old woman here for evaluation of suprapubic and pelvic pain. She states for the last several days she has had discomfort in the suprapubic and pelvic region. She is unable to further characterize the pain. She denies any dysuria or hematuria, urgency or frequency. No vaginal discharge. No flank pain. No fevers or chills. She does have problems with chronic constipation, but she manages this at home. No recent change in bowel pattern. She does have back pain, but this is related to musculoskeletal issues. She had a UTI several months ago, and is concerned that it is coming back. She does have a history of interstitial cystitis.  Past Medical History  Diagnosis Date  . Arthritis     cervical,feet  . Migraine   . GERD (gastroesophageal reflux disease) 12/2006    esophageal stricture  . Vitamin D deficiency   . MVP (mitral valve prolapse)   . Spondylolisthesis     L4-5  . RLS (restless legs syndrome)   . Anxiety   . Interstitial cystitis   . IBS (irritable bowel syndrome)   . Insomnia   . DVT (deep vein thrombosis) in pregnancy 1989    postpartum  . Idiopathic transverse myelitis ([place])     02/2013-cone x 3 weeks   . Depression     pt denies this today 11-14-2015   Past Surgical History  Procedure Laterality Date  . Colonoscopy  11/2015  . Bunionectomy  09,05    both feet  . Upper gastrointestinal endoscopy    . Ear examination under anesthesia      to correct ringing in ear at [facility]  . Lumbar aspiration and drainage      cyst  . Hammer toe surgery  10/29/2011    Procedure: HAMMER TOE CORRECTION;  Surgeon: Kerin Salen;  Location: [place];  Service:  Orthopedics;  Laterality: Left;  Left 2nd toe duvries arthroplasty  . Decompressive lumbar laminectomy level 4  1999   Family History  Problem Relation Age of Onset  . Arthritis Mother     spinal stenosis  . Cataracts Mother   . Heart disease Mother     pacemaker  . Hypertension Father   . Heart disease Father     Quarry manager  . Congestive Heart Failure Father   . Colon cancer Neg Hx   . Colon polyps Neg Hx   . Rectal cancer Neg Hx   . Stomach cancer Neg Hx   . Esophageal cancer Neg Hx    Social History  Substance Use Topics  . Smoking status: Never Smoker   . Smokeless tobacco: Never Used  . Alcohol Use: 0.0 oz/week    0 Standard drinks or equivalent per week     Comment: occasionally   OB History    No data available     Review of Systems As in history of present illness  Allergies  Antihistamines, diphenhydramine-type  Home Medications   Prior to Admission medications   Medication Sig Start Date End Date Taking? Authorizing Provider  Multiple Vitamin (MULTIVITAMIN) capsule Take 1 capsule by mouth daily.      Historical Provider, MD  naproxen sodium (ANAPROX) 220 MG tablet Take 220 mg by mouth 2 (two)  times daily with a meal.    Historical Provider, MD  phenazopyridine (PYRIDIUM) 200 MG tablet Take 1 tablet (200 mg total) by mouth 3 (three) times daily. 04/25/16   Melony Overly, MD  traZODone (DESYREL) 100 MG tablet TAKE 1 TABLET (100 MG TOTAL) BY MOUTH AT BEDTIME AS NEEDED FOR SLEEP/INSOMNIA. 04/21/16   Tresa Garter, MD  traZODone (DESYREL) 50 MG tablet Take 50 mg by mouth. As needed    Historical Provider, MD   Meds Ordered and Administered this Visit  Medications - No data to display  BP 122/83 mmHg  Pulse 69  Temp(Src) 98.1 F (36.7 C) (Oral)  Resp 16  SpO2 97% No data found.   Physical Exam  Constitutional: She is oriented to person, place, and time. She appears well-developed and well-nourished. No distress.  Cardiovascular: Normal rate,  regular rhythm and normal heart sounds.   No murmur heard. Pulmonary/Chest: Effort normal and breath sounds normal. No respiratory distress. She has no wheezes. She has no rales.  Abdominal: Soft. Bowel sounds are normal. She exhibits no distension. There is no tenderness. There is no rebound and no guarding.  No CVA tenderness  Neurological: She is alert and oriented to person, place, and time.    ED Course  Procedures (including critical care time)  Labs Review Labs Reviewed  POCT URINALYSIS DIP (DEVICE) - Abnormal; Notable for the following:    Hgb urine dipstick TRACE (*)    All other components within normal limits  URINE CULTURE    Imaging Review No results found.   MDM   1. Cystitis   UA without overt signs of infection. Symptomatic treatment with Pyridium for the next 2 days while culture is pending. We'll treat based on culture results. If discomfort is persistent, she should follow-up with PCP for possible referral to urology.     Melony Overly, MD 04/25/16 1339

## 2016-04-25 NOTE — ED Notes (Signed)
Pt  Reports   Symptoms   Of       Low  abd  Pain    With  Discomfort       When  She  Urinates       With  Frequency    With   Symptoms  For  Several  Days

## 2016-04-25 NOTE — Discharge Instructions (Signed)
Your urine has a small amount of blood in it. This could be an early sign of infection or just irritation of the bladder wall. Take Pyridium 3 times a day for the next 2 days. I sent your urine for culture. The Pyridium will turn your urine bright orange. If you need antibiotics, we will call you. If your pain is persistent, please follow-up with your primary care doctor for referral to urology.

## 2016-04-26 LAB — URINE CULTURE

## 2016-04-29 ENCOUNTER — Telehealth (HOSPITAL_COMMUNITY): Payer: Self-pay | Admitting: Emergency Medicine

## 2016-04-29 NOTE — ED Notes (Signed)
Called pt and notified of recent lab results from visit 5/12 Pt ID'd properly... Reports feeling a little bit better but still having mild abd pain  Per Dr. Valere Dross,  Please notify patient that urine culture is negative for infection. If pain persists, she should follow up with PCP for urology referral.  Adv pt if sx are not getting better to return  Pt verb understanding

## 2016-04-30 ENCOUNTER — Ambulatory Visit: Payer: Medicaid Other | Admitting: Sports Medicine

## 2016-05-07 ENCOUNTER — Ambulatory Visit (INDEPENDENT_AMBULATORY_CARE_PROVIDER_SITE_OTHER): Payer: Medicaid Other | Admitting: Sports Medicine

## 2016-05-07 ENCOUNTER — Encounter: Payer: Self-pay | Admitting: Sports Medicine

## 2016-05-07 VITALS — BP 95/74 | Ht 68.0 in | Wt 140.0 lb

## 2016-05-07 DIAGNOSIS — M2040 Other hammer toe(s) (acquired), unspecified foot: Secondary | ICD-10-CM | POA: Diagnosis not present

## 2016-05-07 DIAGNOSIS — M25579 Pain in unspecified ankle and joints of unspecified foot: Secondary | ICD-10-CM

## 2016-05-07 NOTE — Patient Instructions (Signed)
[address] Mayer Camel Thursday 05/08/16 at 11:15am [address], [place], [place] [address] Phone: [phone]

## 2016-05-08 ENCOUNTER — Encounter: Payer: Self-pay | Admitting: Sports Medicine

## 2016-05-08 NOTE — Progress Notes (Signed)
Gail Gilbert - 65 y.o. female MRN [id number]  Date of birth: Dec 22, 1950  CC: chronic hip and foot pain, right sided  SUBJECTIVE:   HPI Mrs. Fortini is a very pleasant 65 yo female with a history of spondylolisthesis & history of transverse myelitis (01/2013) who presents for follow up of her hammer toes and bunions. Today her chronic low back and right hip are not painful. exacerbation of  chronic right hip pain and  low back pain:  Right foot pain/hammer toes:  - History of b/l bunionectomy in '09 & '05. Hx of hammer toe surgery on the left in '12 - Tries wearing cloth topped shoes.  - tried hammer toe straps without much success - no orthotics, usually wears flats.   - Also has tried heel lifts on the right due to scoliosis, no measurable leg length discrepancy.    ROS:     As above, no fevers, chills, or night sweats.  No headache. No unexplained weight loss  HISTORY: Past Medical, Surgical, Social, and Family History Reviewed & Updated per EMR.  Pertinent Historical Findings include: Hx of transverse myelitis Spondylolisthesis with low back pain Restless leg syndrome depression  DATA REVIEW:  03/2016 MRI. Scoliosis, multiplevel degenerative changes, most prominent at L4-5 as well as L3-4. 1.2cm anterolisthesis of L4 leading w/o spinal stenosis, although b/l lateral recess stenosis and history of laminectomy.     OBJECTIVE: BP 95/74 mmHg  Ht 5\' 8"  (1.727 m)  Wt 140 lb (63.504 kg)  BMI 21.29 kg/m2  Physical Exam  Calm, NAD Non-labored breathing  Back Exam: Inspection: scoliotic curve Motion: limited in all direction due to pain. Minimal pain with extension.  SLR seated: negative                        SLR lying: negative XSLR seated: negative                     XSLR lying: negative Sensory change: none Reflex change: symmetric Leg lengths equal  Gait Walking:milding antalgic Short legged gait  On b/l foot patient has corrected hallux valgus deformity of great toe and  first MT on the right: there is associated loss of longitudinal arch and pes planus: Loss of transverse arch with breakdown of MTs and subluxation through the capsule ; bunionette present. + hammertoeing   MEDICATIONS, LABS & OTHER ORDERS: Previous Medications   MULTIPLE VITAMIN (MULTIVITAMIN) CAPSULE    Take 1 capsule by mouth daily.     NAPROXEN SODIUM (ANAPROX) 220 MG TABLET    Take 220 mg by mouth 2 (two) times daily with a meal.   PHENAZOPYRIDINE (PYRIDIUM) 200 MG TABLET    Take 1 tablet (200 mg total) by mouth 3 (three) times daily.   TRAZODONE (DESYREL) 100 MG TABLET    TAKE 1 TABLET (100 MG TOTAL) BY MOUTH AT BEDTIME AS NEEDED FOR SLEEP/INSOMNIA.   TRAZODONE (DESYREL) 50 MG TABLET    Take 50 mg by mouth. As needed   Modified Medications   No medications on file   New Prescriptions   No medications on file   Discontinued Medications   No medications on file   Orders Placed This Encounter  Procedures  . Ambulatory referral to Orthopedic Surgery   ASSESSMENT & PLAN: Chronic back/hip pain/scoliosis: no issues today. Doing exercises. We are correcting functional leg length discrapncy from scoliosis with 3/16th" heel lift on the right.    Chronic foot pain/Morton's Changes, most  notably hammer toes: Status post bunionectomy b/l with 1 hammer toe surgery.  Hammer toe strap ineffective.  Added scaphoid pads to shoes. Consider orthotics. Refer back to Dr. Mayer Camel for evaluation of another HT surgery.    F/u 4 weeks.

## 2016-05-29 ENCOUNTER — Ambulatory Visit: Payer: Self-pay | Admitting: Internal Medicine

## 2016-06-02 ENCOUNTER — Ambulatory Visit: Payer: Medicaid Other | Admitting: Sports Medicine

## 2016-06-04 ENCOUNTER — Other Ambulatory Visit: Payer: Self-pay | Admitting: Orthopedic Surgery

## 2016-06-05 ENCOUNTER — Telehealth: Payer: Self-pay | Admitting: *Deleted

## 2016-06-05 NOTE — Telephone Encounter (Signed)
Patient verified DOB Patient is aware of worsening osteoarthritis being present in the MRI. Patient aware of orthopedic surgeon referral being placed. Patient also scheduled a nurse visit for 06/06/16 for a pneumococcal vaccine. No further questions at this time.

## 2016-06-06 ENCOUNTER — Ambulatory Visit: Payer: Medicaid Other | Attending: Internal Medicine

## 2016-06-06 DIAGNOSIS — Z23 Encounter for immunization: Secondary | ICD-10-CM

## 2016-06-06 NOTE — Patient Instructions (Signed)
Pneumococcal Polysaccharide Vaccine: What You Need to Know  1. Why get vaccinated?  Vaccination can protect older adults (and some children and younger adults) from pneumococcal disease.  Pneumococcal disease is caused by bacteria that can spread from person to person through close contact. It can cause ear infections, and it can also lead to more serious infections of the:   · Lungs (pneumonia),  · Blood (bacteremia), and  · Covering of the brain and spinal cord (meningitis). Meningitis can cause deafness and brain damage, and it can be fatal.  Anyone can get pneumococcal disease, but children under 2 years of age, people with certain medical conditions, adults over 65 years of age, and cigarette smokers are at the highest risk.  About 18,000 older adults die each year from pneumococcal disease in the [place].  Treatment of pneumococcal infections with penicillin and other drugs used to be more effective. But some strains of the disease have become resistant to these drugs. This makes prevention of the disease, through vaccination, even more important.  2. Pneumococcal polysaccharide vaccine (PPSV23)  Pneumococcal polysaccharide vaccine (PPSV23) protects against 23 types of pneumococcal bacteria. It will not prevent all pneumococcal disease.  PPSV23 is recommended for:  · All adults 65 years of age and older,  · Anyone 2 through 64 years of age with certain long-term health problems,  · Anyone 2 through 64 years of age with a weakened immune system,  · Adults 19 through 64 years of age who smoke cigarettes or have asthma.  Most people need only one dose of PPSV. A second dose is recommended for certain high-risk groups. People 65 and older should get a dose even if they have gotten one or more doses of the vaccine before they turned 65.  Your healthcare provider can give you more information about these recommendations.  Most healthy adults develop protection within 2 to 3 weeks of getting the shot.  3. Some  people should not get this vaccine  · Anyone who has had a life-threatening allergic reaction to PPSV should not get another dose.  · Anyone who has a severe allergy to any component of PPSV should not receive it. Tell your provider if you have any severe allergies.  · Anyone who is moderately or severely ill when the shot is scheduled may be asked to wait until they recover before getting the vaccine. Someone with a mild illness can usually be vaccinated.  · Children less than 2 years of age should not receive this vaccine.  · There is no evidence that PPSV is harmful to either a pregnant woman or to her fetus. However, as a precaution, women who need the vaccine should be vaccinated before becoming pregnant, if possible.  4. Risks of a vaccine reaction  With any medicine, including vaccines, there is a chance of side effects. These are usually mild and go away on their own, but serious reactions are also possible.  About half of people who get PPSV have mild side effects, such as redness or pain where the shot is given, which go away within about two days.  Less than 1 out of 100 people develop a fever, muscle aches, or more severe local reactions.  Problems that could happen after any vaccine:  · People sometimes faint after a medical procedure, including vaccination. Sitting or lying down for about 15 minutes can help prevent fainting, and injuries caused by a fall. Tell your doctor if you feel dizzy, or have vision changes or   ringing in the ears.  · Some people get severe pain in the shoulder and have difficulty moving the arm where a shot was given. This happens very rarely.  · Any medication can cause a severe allergic reaction. Such reactions from a vaccine are very rare, estimated at about 1 in a million doses, and would happen within a few minutes to a few hours after the vaccination.  As with any medicine, there is a very remote chance of a vaccine causing a serious injury or death.  The safety of  vaccines is always being monitored. For more information, visit: www.cdc.gov/vaccinesafety/  5. What if there is a serious reaction?  What should I look for?  Look for anything that concerns you, such as signs of a severe allergic reaction, very high fever, or unusual behavior.   Signs of a severe allergic reaction can include hives, swelling of the face and throat, difficulty breathing, a fast heartbeat, dizziness, and weakness. These would usually start a few minutes to a few hours after the vaccination.  What should I do?  If you think it is a severe allergic reaction or other emergency that can't wait, call 9-1-1 or get to the nearest hospital. Otherwise, call your doctor.  Afterward, the reaction should be reported to the Vaccine Adverse Event Reporting System (VAERS). Your doctor might file this report, or you can do it yourself through the VAERS web site at www.vaers.hhs.gov, or by calling 1-800-822-7967.   VAERS does not give medical advice.  6. How can I learn more?  · Ask your doctor. He or she can give you the vaccine package insert or suggest other sources of information.  · Call your local or state health department.  · Contact the Centers for Disease Control and Prevention (CDC):    Call 1-800-232-4636 (1-800-CDC-INFO) or    Visit CDC's website at www.cdc.gov/vaccines  CDC Pneumococcal Polysaccharide Vaccine VIS (04/07/14)     This information is not intended to replace advice given to you by your health care provider. Make sure you discuss any questions you have with your health care provider.     Document Released: 09/28/2006 Document Revised: 12/22/2014 Document Reviewed: 04/10/2014  Elsevier Interactive Patient Education ©2016 [employer] Inc[employer]

## 2016-06-06 NOTE — Progress Notes (Signed)
   Subjective:    Patient ID: Gail Gilbert, female    DOB: Aug 22, 1951, 65 y.o.   MRN: [id number]  HPI Pt is here for PCV23 vaccine only.   Review of Systems     Objective:   Physical Exam        Assessment & Plan:

## 2016-06-25 ENCOUNTER — Encounter (HOSPITAL_BASED_OUTPATIENT_CLINIC_OR_DEPARTMENT_OTHER): Payer: Self-pay | Admitting: *Deleted

## 2016-07-15 DIAGNOSIS — M2041 Other hammer toe(s) (acquired), right foot: Secondary | ICD-10-CM

## 2016-07-15 HISTORY — DX: Other hammer toe(s) (acquired), right foot: M20.41

## 2016-07-18 ENCOUNTER — Other Ambulatory Visit: Payer: Self-pay | Admitting: Orthopedic Surgery

## 2016-07-18 ENCOUNTER — Encounter (HOSPITAL_BASED_OUTPATIENT_CLINIC_OR_DEPARTMENT_OTHER): Payer: Self-pay | Admitting: *Deleted

## 2016-07-22 NOTE — H&P (Signed)
Gail Gilbert is an 65 y.o. female.   Chief Complaint: Right foot pain  HPI:  Patient presents with a chief complaint of bilateral foot pain.  She has a history of transverse myelitis in March 2014 which caused a gradual onset of hammertoes.  She's noticed increased pain over the past 2 years.  Her pain comes and goes and is moderate in severity.  She describes as dull pain.  She does have some numbness and weakness.  Her symptoms are getting worse and wake her from sleep at times.  Worse with work and better with rest and anti-inflammatories.  Past Medical History:  Diagnosis Date  . Cataract, immature   . Dental crowns present   . Hammer toe of right foot 07/2016   2nd and 3rd toes  . IBS (irritable bowel syndrome)    no current problems  . Idiopathic transverse myelitis ([place]) 2014   residual numbness feet and right small finger  . Osteoarthritis    cervical spine, lumbar spine  . RLS (restless legs syndrome)    no current med.    Past Surgical History:  Procedure Laterality Date  . BUNIONECTOMY Bilateral   . COLONOSCOPY WITH PROPOFOL  11/26/2015  . HAMMER TOE SURGERY  10/29/2011   Procedure: HAMMER TOE CORRECTION;  Surgeon: Kerin Salen;  Location: [facility];  Service: Orthopedics;  Laterality: Left;  Left 2nd toe duvries arthroplasty  . MIDDLE EAR SURGERY     to relieve ringing in ear  . SYNOVIAL CYST EXCISION     lumbar back    Family History  Problem Relation Age of Onset  . Arthritis Mother     spinal stenosis  . Cataracts Mother   . Heart disease Mother     pacemaker  . Hypertension Father   . Heart disease Father     Quarry manager  . Congestive Heart Failure Father    Social History:  reports that she has never smoked. She has never used smokeless tobacco. She reports that she drinks alcohol. She reports that she does not use drugs.  Allergies:  Allergies  Allergen Reactions  . Antihistamines, Diphenhydramine-Type Palpitations    No  prescriptions prior to admission.    No results found for this or any previous visit (from the past 48 hour(s)). No results found.  Review of Systems  HENT: Negative.   Eyes: Negative.   Respiratory: Negative.   Cardiovascular: Negative.   Gastrointestinal: Positive for constipation.  Genitourinary: Positive for urgency.  Musculoskeletal: Positive for joint pain and myalgias.  Skin: Negative.   Neurological: Positive for weakness.       Poor balance  Endo/Heme/Allergies: Negative.   Psychiatric/Behavioral: Positive for depression. The patient is nervous/anxious.     Height 5\' 8"  (1.727 m), weight 63.5 kg (140 lb). Physical Exam  Constitutional: She is oriented to person, place, and time. She appears well-developed and well-nourished.  HENT:  Head: Normocephalic and atraumatic.  Eyes: Pupils are equal, round, and reactive to light.  Neck: Normal range of motion. Neck supple.  Cardiovascular: Intact distal pulses.   Respiratory: Effort normal.  Musculoskeletal: She exhibits tenderness.  the patient does have obvious hammertoes of the right foot including the second third and fourth toes.  The second toes most rigid.  On the left foot she does have a hammertoes the third toe and has obvious postoperative changes and a surgical scar of the second toe and also evidence of her disc bunionectomies bilaterally.  Neurological: She is alert  and oriented to person, place, and time.  Skin: Skin is warm and dry.  Psychiatric: She has a normal mood and affect. Her behavior is normal. Judgment and thought content normal.     Assessment/Plan  Assess: Hammertoes right foot including second third and fourth toes and left foot third toe  Plan: This patient is discussed with Dr. Mayer Camel who also examined the patient and discusses treatment options.  She does wish to proceed with surgery and we will fill out a posting slip.  The benefits risks and potential competitions of surgery are discussed.   She wishes to have surgery when she returns from her summer vacation in July at some time.  We and displacing her back at time of surgical intervention.  Call with any issues.  Jazma Pickel R, PA-C 07/22/2016, 8:12 AM

## 2016-07-23 ENCOUNTER — Encounter (HOSPITAL_BASED_OUTPATIENT_CLINIC_OR_DEPARTMENT_OTHER)
Admission: RE | Disposition: A | Discharge status: Home / Self Care | Payer: Self-pay | Source: Ambulatory Visit | Attending: Orthopedic Surgery

## 2016-07-23 ENCOUNTER — Encounter (HOSPITAL_BASED_OUTPATIENT_CLINIC_OR_DEPARTMENT_OTHER): Payer: Self-pay | Admitting: Anesthesiology

## 2016-07-23 ENCOUNTER — Ambulatory Visit (HOSPITAL_BASED_OUTPATIENT_CLINIC_OR_DEPARTMENT_OTHER): Payer: Medicaid Other | Admitting: Anesthesiology

## 2016-07-23 ENCOUNTER — Ambulatory Visit (HOSPITAL_BASED_OUTPATIENT_CLINIC_OR_DEPARTMENT_OTHER)
Admission: RE | Admit: 2016-07-23 | Discharge: 2016-07-23 | Disposition: A | Discharge status: Home / Self Care | Payer: Medicaid Other | Source: Ambulatory Visit | Attending: Orthopedic Surgery | Admitting: Orthopedic Surgery

## 2016-07-23 DIAGNOSIS — M479 Spondylosis, unspecified: Secondary | ICD-10-CM | POA: Diagnosis not present

## 2016-07-23 DIAGNOSIS — M2041 Other hammer toe(s) (acquired), right foot: Secondary | ICD-10-CM

## 2016-07-23 HISTORY — DX: Dental restoration status: Z98.811

## 2016-07-23 HISTORY — DX: Other hammer toe(s) (acquired), right foot: M20.41

## 2016-07-23 HISTORY — PX: HAMMER TOE SURGERY: SHX385

## 2016-07-23 HISTORY — DX: Unspecified osteoarthritis, unspecified site: M19.90

## 2016-07-23 HISTORY — DX: Unspecified cataract: H26.9

## 2016-07-23 SURGERY — CORRECTION, HAMMER TOE
Anesthesia: General | Site: Foot | Laterality: Right

## 2016-07-23 MED ORDER — BUPIVACAINE-EPINEPHRINE (PF) 0.5% -1:200000 IJ SOLN
INTRAMUSCULAR | Status: AC
  Filled 2016-07-23: qty 30

## 2016-07-23 MED ORDER — BUPIVACAINE-EPINEPHRINE (PF) 0.5% -1:200000 IJ SOLN
INTRAMUSCULAR | Status: DC | PRN
Start: 2016-07-23 — End: 2016-07-23
  Administered 2016-07-23: 10 mL

## 2016-07-23 MED ORDER — OXYCODONE HCL 5 MG/5ML PO SOLN: 5.0000 mg | Freq: Once | ORAL | Status: DC | PRN

## 2016-07-23 MED ORDER — GLYCOPYRROLATE 0.2 MG/ML IJ SOLN: 0.2000 mg | Freq: Once | INTRAMUSCULAR | Status: DC | PRN

## 2016-07-23 MED ORDER — ONDANSETRON HCL 4 MG/2ML IJ SOLN: 4.0000 mg | Freq: Once | INTRAMUSCULAR | Status: DC | PRN

## 2016-07-23 MED ORDER — FENTANYL CITRATE (PF) 100 MCG/2ML IJ SOLN: 50.0000 ug | INTRAMUSCULAR | Status: DC | PRN

## 2016-07-23 MED ORDER — CHLORHEXIDINE GLUCONATE 4 % EX LIQD: 60.0000 mL | Freq: Once | CUTANEOUS | Status: DC

## 2016-07-23 MED ORDER — PROPOFOL 10 MG/ML IV BOLUS
INTRAVENOUS | Status: AC
  Filled 2016-07-23: qty 20

## 2016-07-23 MED ORDER — HYDROCODONE-ACETAMINOPHEN 5-325 MG PO TABS: 1.0000 | ORAL_TABLET | Freq: Four times a day (QID) | ORAL | 0 refills | Status: DC | PRN

## 2016-07-23 MED ORDER — SCOPOLAMINE 1 MG/3DAYS TD PT72: 1.0000 | MEDICATED_PATCH | Freq: Once | TRANSDERMAL | Status: DC | PRN

## 2016-07-23 MED ORDER — OXYCODONE HCL 5 MG PO TABS: 5.0000 mg | ORAL_TABLET | Freq: Once | ORAL | Status: DC | PRN

## 2016-07-23 MED ORDER — BUPIVACAINE-EPINEPHRINE (PF) 0.25% -1:200000 IJ SOLN
INTRAMUSCULAR | Status: AC
  Filled 2016-07-23: qty 30

## 2016-07-23 MED ORDER — DEXAMETHASONE SODIUM PHOSPHATE 10 MG/ML IJ SOLN
INTRAMUSCULAR | Status: DC | PRN
Start: 2016-07-23 — End: 2016-07-23
  Administered 2016-07-23: 10 mg via INTRAVENOUS

## 2016-07-23 MED ORDER — FENTANYL CITRATE (PF) 100 MCG/2ML IJ SOLN
INTRAMUSCULAR | Status: DC | PRN
  Administered 2016-07-23: 100 ug via INTRAVENOUS

## 2016-07-23 MED ORDER — ONDANSETRON HCL 4 MG/2ML IJ SOLN
INTRAMUSCULAR | Status: AC
  Filled 2016-07-23: qty 2

## 2016-07-23 MED ORDER — DEXTROSE-NACL 5-0.45 % IV SOLN
INTRAVENOUS | Status: DC
Start: 2016-07-23 — End: 2016-07-23

## 2016-07-23 MED ORDER — BUPIVACAINE HCL (PF) 0.5 % IJ SOLN
INTRAMUSCULAR | Status: AC
  Filled 2016-07-23: qty 30

## 2016-07-23 MED ORDER — LIDOCAINE HCL (CARDIAC) 20 MG/ML IV SOLN
INTRAVENOUS | Status: DC | PRN
  Administered 2016-07-23: 50 mg via INTRAVENOUS

## 2016-07-23 MED ORDER — ONDANSETRON HCL 4 MG/2ML IJ SOLN: 4.0000 mg | Freq: Four times a day (QID) | INTRAMUSCULAR | Status: DC | PRN

## 2016-07-23 MED ORDER — LIDOCAINE 2% (20 MG/ML) 5 ML SYRINGE
INTRAMUSCULAR | Status: AC
  Filled 2016-07-23: qty 5

## 2016-07-23 MED ORDER — LACTATED RINGERS IV SOLN
INTRAVENOUS | Status: DC
  Administered 2016-07-23 (×2): via INTRAVENOUS

## 2016-07-23 MED ORDER — METOCLOPRAMIDE HCL 5 MG PO TABS: 5.0000 mg | ORAL_TABLET | Freq: Three times a day (TID) | ORAL | Status: DC | PRN

## 2016-07-23 MED ORDER — HYDROMORPHONE HCL 1 MG/ML IJ SOLN: 0.2500 mg | INTRAMUSCULAR | Status: DC | PRN

## 2016-07-23 MED ORDER — MIDAZOLAM HCL 5 MG/5ML IJ SOLN
INTRAMUSCULAR | Status: DC | PRN
  Administered 2016-07-23: 2 mg via INTRAVENOUS

## 2016-07-23 MED ORDER — MEPERIDINE HCL 25 MG/ML IJ SOLN: 6.2500 mg | INTRAMUSCULAR | Status: DC | PRN

## 2016-07-23 MED ORDER — MIDAZOLAM HCL 2 MG/2ML IJ SOLN: 1.0000 mg | INTRAMUSCULAR | Status: DC | PRN

## 2016-07-23 MED ORDER — MIDAZOLAM HCL 2 MG/2ML IJ SOLN
INTRAMUSCULAR | Status: AC
  Filled 2016-07-23: qty 2

## 2016-07-23 MED ORDER — ONDANSETRON HCL 4 MG/2ML IJ SOLN
INTRAMUSCULAR | Status: DC | PRN
Start: 2016-07-23 — End: 2016-07-23
  Administered 2016-07-23: 4 mg via INTRAVENOUS

## 2016-07-23 MED ORDER — METOCLOPRAMIDE HCL 5 MG/ML IJ SOLN: 5.0000 mg | Freq: Three times a day (TID) | INTRAMUSCULAR | Status: DC | PRN

## 2016-07-23 MED ORDER — DEXAMETHASONE SODIUM PHOSPHATE 10 MG/ML IJ SOLN
INTRAMUSCULAR | Status: AC
  Filled 2016-07-23: qty 1

## 2016-07-23 MED ORDER — FENTANYL CITRATE (PF) 100 MCG/2ML IJ SOLN
INTRAMUSCULAR | Status: AC
  Filled 2016-07-23: qty 2

## 2016-07-23 MED ORDER — ONDANSETRON HCL 4 MG PO TABS: 4.0000 mg | ORAL_TABLET | Freq: Four times a day (QID) | ORAL | Status: DC | PRN

## 2016-07-23 MED ORDER — PROPOFOL 10 MG/ML IV BOLUS
INTRAVENOUS | Status: DC | PRN
  Administered 2016-07-23: 20 mg via INTRAVENOUS
  Administered 2016-07-23: 200 mg via INTRAVENOUS

## 2016-07-23 SURGICAL SUPPLY — 63 items
BANDAGE ACE 4X5 VEL STRL LF (GAUZE/BANDAGES/DRESSINGS) ×6 IMPLANT
BLADE OSC/SAG .038X5.5 CUT EDG (BLADE) ×3 IMPLANT
BLADE SURG 10 STRL SS (BLADE) ×3 IMPLANT
BLADE SURG 15 STRL LF DISP TIS (BLADE) ×2 IMPLANT
BLADE SURG 15 STRL SS (BLADE) ×4
BNDG ESMARK 4X9 LF (GAUZE/BANDAGES/DRESSINGS) ×3 IMPLANT
BOOT STEPPER DURA LG (SOFTGOODS) IMPLANT
BOOT STEPPER DURA MED (SOFTGOODS) IMPLANT
BOOT STEPPER DURA SM (SOFTGOODS) IMPLANT
CANISTER SUCT 1200ML W/VALVE (MISCELLANEOUS) IMPLANT
CORDS BIPOLAR (ELECTRODE) ×3 IMPLANT
COVER BACK TABLE 60X90IN (DRAPES) ×3 IMPLANT
COVER MAYO STAND STRL (DRAPES) ×3 IMPLANT
CUFF TOURNIQUET SINGLE 18IN (TOURNIQUET CUFF) ×3 IMPLANT
CUFF TOURNIQUET SINGLE 34IN LL (TOURNIQUET CUFF) IMPLANT
DECANTER SPIKE VIAL GLASS SM (MISCELLANEOUS) IMPLANT
DRAPE EXTREMITY T 121X128X90 (DRAPE) ×3 IMPLANT
DRAPE IMP U-DRAPE 54X76 (DRAPES) ×3 IMPLANT
DRAPE OEC MINIVIEW 54X84 (DRAPES) IMPLANT
DRAPE SURG 17X23 STRL (DRAPES) ×3 IMPLANT
DURAPREP 26ML APPLICATOR (WOUND CARE) ×3 IMPLANT
ELECT REM PT RETURN 9FT ADLT (ELECTROSURGICAL) ×3
ELECTRODE REM PT RTRN 9FT ADLT (ELECTROSURGICAL) ×1 IMPLANT
GAUZE SPONGE 4X4 12PLY STRL (GAUZE/BANDAGES/DRESSINGS) ×3 IMPLANT
GAUZE SPONGE 4X4 16PLY XRAY LF (GAUZE/BANDAGES/DRESSINGS) IMPLANT
GAUZE XEROFORM 1X8 LF (GAUZE/BANDAGES/DRESSINGS) ×3 IMPLANT
GLOVE BIO SURGEON STRL SZ 6.5 (GLOVE) ×2 IMPLANT
GLOVE BIO SURGEON STRL SZ7.5 (GLOVE) ×3 IMPLANT
GLOVE BIO SURGEON STRL SZ8.5 (GLOVE) ×3 IMPLANT
GLOVE BIO SURGEONS STRL SZ 6.5 (GLOVE) ×1
GLOVE BIOGEL PI IND STRL 7.0 (GLOVE) ×1 IMPLANT
GLOVE BIOGEL PI IND STRL 8 (GLOVE) ×1 IMPLANT
GLOVE BIOGEL PI IND STRL 9 (GLOVE) ×1 IMPLANT
GLOVE BIOGEL PI INDICATOR 7.0 (GLOVE) ×2
GLOVE BIOGEL PI INDICATOR 8 (GLOVE) ×2
GLOVE BIOGEL PI INDICATOR 9 (GLOVE) ×2
GOWN STRL REUS W/ TWL LRG LVL3 (GOWN DISPOSABLE) ×1 IMPLANT
GOWN STRL REUS W/TWL LRG LVL3 (GOWN DISPOSABLE) ×2
GOWN STRL REUS W/TWL XL LVL3 (GOWN DISPOSABLE) ×3 IMPLANT
NEEDLE HYPO 22GX1.5 SAFETY (NEEDLE) ×3 IMPLANT
NS IRRIG 1000ML POUR BTL (IV SOLUTION) ×3 IMPLANT
PACK BASIN DAY SURGERY FS (CUSTOM PROCEDURE TRAY) ×3 IMPLANT
PAD CAST 4YDX4 CTTN HI CHSV (CAST SUPPLIES) ×1 IMPLANT
PADDING CAST ABS 4INX4YD NS (CAST SUPPLIES) ×2
PADDING CAST ABS COTTON 4X4 ST (CAST SUPPLIES) ×1 IMPLANT
PADDING CAST COTTON 4X4 STRL (CAST SUPPLIES) ×2
PENCIL BUTTON HOLSTER BLD 10FT (ELECTRODE) ×3 IMPLANT
SLEEVE SCD COMPRESS KNEE MED (MISCELLANEOUS) IMPLANT
SPONGE LAP 18X18 X RAY DECT (DISPOSABLE) ×3 IMPLANT
SUCTION FRAZIER HANDLE 10FR (MISCELLANEOUS)
SUCTION TUBE FRAZIER 10FR DISP (MISCELLANEOUS) IMPLANT
SUT ETHILON 3 0 PS 1 (SUTURE) ×6 IMPLANT
SUT ETHILON 4 0 PS 2 18 (SUTURE) ×3 IMPLANT
SUT TICRON 1 T 12 (SUTURE) IMPLANT
SUT VIC AB 2-0 SH 27 (SUTURE)
SUT VIC AB 2-0 SH 27XBRD (SUTURE) IMPLANT
SYR BULB 3OZ (MISCELLANEOUS) ×3 IMPLANT
SYR CONTROL 10ML LL (SYRINGE) ×3 IMPLANT
TOWEL OR 17X24 6PK STRL BLUE (TOWEL DISPOSABLE) ×3 IMPLANT
TUBE CONNECTING 20'X1/4 (TUBING)
TUBE CONNECTING 20X1/4 (TUBING) IMPLANT
UNDERPAD 30X30 (UNDERPADS AND DIAPERS) ×3 IMPLANT
YANKAUER SUCT BULB TIP NO VENT (SUCTIONS) IMPLANT

## 2016-07-23 NOTE — Interval H&P Note (Signed)
History and Physical Interval Note:  07/23/2016 11:34 AM  Gail Gilbert  has presented today for surgery, with the diagnosis of RIGHT FOOT HAMMER TOES SECOND AND THIRD TOES  The various methods of treatment have been discussed with the patient and family. After consideration of risks, benefits and other options for treatment, the patient has consented to  Procedure(s): HAMMER TOE CORRECTION DUVRIES ARTHROPLASTY (Right) as a surgical intervention .  The patient's history has been reviewed, patient examined, no change in status, stable for surgery.  I have reviewed the patient's chart and labs.  Questions were answered to the patient's satisfaction.     Kerin Salen

## 2016-07-23 NOTE — Anesthesia Postprocedure Evaluation (Signed)
Anesthesia Post Note  Patient: Gail Gilbert  Procedure(s) Performed: Procedure(s) (LRB): HAMMER TOE CORRECTION DUVRIES ARTHROPLASTY (Right)  Patient location during evaluation: PACU Anesthesia Type: General Level of consciousness: awake and alert Pain management: pain level controlled Vital Signs Assessment: post-procedure vital signs reviewed and stable Respiratory status: spontaneous breathing, nonlabored ventilation and respiratory function stable Cardiovascular status: blood pressure returned to baseline and stable Postop Assessment: no signs of nausea or vomiting Anesthetic complications: no    Last Vitals:  Vitals:   07/23/16 1245 07/23/16 1300  BP: 135/85 (!) 142/92  Pulse: 77 81  Resp: 15 (!) 21  Temp:      Last Pain:  Vitals:   07/23/16 1303  PainSc: 3                  Julianah Marciel,[place] A

## 2016-07-23 NOTE — Anesthesia Preprocedure Evaluation (Signed)
Anesthesia Evaluation  Patient identified by MRN, date of birth, ID band Patient awake    Reviewed: Allergy & Precautions, NPO status , Patient's Chart, lab work & pertinent test results  Airway Mallampati: I  TM Distance: >3 FB Neck ROM: Full    Dental  (+) Teeth Intact, Dental Advisory Given   Pulmonary  breath sounds clear to auscultation        Cardiovascular Rhythm:Regular Rate:Normal     Neuro/Psych    GI/Hepatic   Endo/Other    Renal/GU      Musculoskeletal   Abdominal   Peds  Hematology   Anesthesia Other Findings   Reproductive/Obstetrics                             Anesthesia Physical Anesthesia Plan  ASA: I  Anesthesia Plan: General   Post-op Pain Management:    Induction: Intravenous  Airway Management Planned: LMA  Additional Equipment:   Intra-op Plan:   Post-operative Plan: Extubation in OR  Informed Consent: I have reviewed the patients History and Physical, chart, labs and discussed the procedure including the risks, benefits and alternatives for the proposed anesthesia with the patient or authorized representative who has indicated his/her understanding and acceptance.   Dental advisory given  Plan Discussed with: CRNA, Anesthesiologist and Surgeon  Anesthesia Plan Comments:         Anesthesia Quick Evaluation  

## 2016-07-23 NOTE — Discharge Instructions (Addendum)
Hammer Toes Hammer toes is a condition in which one or more of your toes is permanently flexed. CAUSES  This happens when a muscle imbalance or abnormal bone length makes your small toes buckle. This causes the toe joint to contract and the strong cord-like bands that attach muscles to the bones (tendons) in your toes to shorten.  SIGNS AND SYMPTOMS  Common symptoms of flexible hammer toes include:   A buildup of skin cells (corns). Corns occur where boney bumps come in frequent contact with hard surfaces. For example, where your shoes press and rub.  Irritation.  Inflammation.  Pain.  Limited motion in your toes. DIAGNOSIS  Hammer toes are diagnosed through a physical exam of your toes. During the exam, your health care provider may try to reproduce your symptoms by manipulating your foot. Often, X-ray exams are done to determine the degree of deformity and to make sure that the cause is not a fracture.  TREATMENT  Hammer toes can be treated with corrective surgery. There are several types of surgical procedures that can treat hammer toes. The most common procedures include:  Arthroplasty--A portion of the joint is surgically removed and your toe is straightened. The gap fills in with fibrous tissue. This procedure helps treat pain and deformity and helps restore function.  Fusion--Cartilage between the two bones of the affected joint is taken out and the bones fuse together into one longer bone. This helps keep your toe stable and reduces pain but leaves your toe stiff, yet straight.  Implantation--A portion of your bone is removed and replaced with an implant to restore motion.  Flexor tendon transfers--This procedure repositions the tendons that curl the toes down (flexor tendons). This may be done to release the deforming force that causes your toe to buckle. Several of these procedures require fixing your toe with a pin that is visible at the tip of your toe. The pin keeps the toe  straight during healing. Your health care provider will remove the pin usually within 4-8 weeks after the procedure.    This information is not intended to replace advice given to you by your health care provider. Make sure you discuss any questions you have with your health care provider.    Post Anesthesia Home Care Instructions  Activity: Get plenty of rest for the remainder of the day. A responsible adult should stay with you for 24 hours following the procedure.  For the next 24 hours, DO NOT: -Drive a car -Paediatric nurse -Drink alcoholic beverages -Take any medication unless instructed by your physician -Make any legal decisions or sign important papers.  Meals: Start with liquid foods such as gelatin or soup. Progress to regular foods as tolerated. Avoid greasy, spicy, heavy foods. If nausea and/or vomiting occur, drink only clear liquids until the nausea and/or vomiting subsides. Call your physician if vomiting continues.  Special Instructions/Symptoms: Your throat may feel dry or sore from the anesthesia or the breathing tube placed in your throat during surgery. If this causes discomfort, gargle with warm salt water. The discomfort should disappear within 24 hours.  If you had a scopolamine patch placed behind your ear for the management of post- operative nausea and/or vomiting:  1. The medication in the patch is effective for 72 hours, after which it should be removed.  Wrap patch in a tissue and discard in the trash. Wash hands thoroughly with soap and water. 2. You may remove the patch earlier than 72 hours if you experience unpleasant  side effects which may include dry mouth, dizziness or visual disturbances. 3. Avoid touching the patch. Wash your hands with soap and water after contact with the patch.   Call your surgeon if you experience:   1.  Fever over 101.0. 2.  Inability to urinate. 3.  Nausea and/or vomiting. 4.  Extreme swelling or bruising at the surgical  site. 5.  Continued bleeding from the incision. 6.  Increased pain, redness or drainage from the incision. 7.  Problems related to your pain medication. 8.  Any problems and/or concerns   Document Released: 11/28/2000 Document Revised: 12/06/2013 Document Reviewed: 08/08/2013 Elsevier Interactive Patient Education [employer].

## 2016-07-23 NOTE — Transfer of Care (Signed)
Immediate Anesthesia Transfer of Care Note  Patient: Gail Gilbert  Procedure(s) Performed: Procedure(s): HAMMER TOE CORRECTION DUVRIES ARTHROPLASTY (Right)  Patient Location: PACU  Anesthesia Type:General  Level of Consciousness: awake and patient cooperative  Airway & Oxygen Therapy: Patient Spontanous Breathing and Patient connected to face mask oxygen  Post-op Assessment: Report given to RN and Post -op Vital signs reviewed and stable  Post vital signs: Reviewed and stable  Last Vitals:  Vitals:   07/23/16 1012  BP: 130/68  Pulse: 72  Resp: 18  Temp: 36.9 C    Last Pain:  Vitals:   07/18/16 1114  PainSc: 1          Complications: No apparent anesthesia complications

## 2016-07-23 NOTE — Anesthesia Procedure Notes (Signed)
Procedure Name: LMA Insertion Date/Time: 07/23/2016 11:50 AM Performed by: Toula Moos L Pre-anesthesia Checklist: Patient identified, Emergency Drugs available, Suction available, Patient being monitored and Timeout performed Patient Re-evaluated:Patient Re-evaluated prior to inductionOxygen Delivery Method: Circle system utilized Preoxygenation: Pre-oxygenation with 100% oxygen Intubation Type: IV induction Ventilation: Mask ventilation without difficulty LMA: LMA inserted LMA Size: 4.0 Number of attempts: 1 Airway Equipment and Method: Bite block Placement Confirmation: positive ETCO2 Tube secured with: Tape Dental Injury: Teeth and Oropharynx as per pre-operative assessment

## 2016-07-23 NOTE — Op Note (Signed)
Pre Op Dx: Right second third and fourth hammertoes  Post Op Dx: Right second third and fourth hammertoes  Procedure: Right foot second third and fourth due degrees arthroplasties  Surgeon: Kerin Salen, MD  Assistant: Kerry Hough. Barton Dubois  (present throughout entire procedure and necessary for timely completion of the procedure)  Anesthesia: General  EBL: Minimal  Fluids: 800 mL  Tourniquet Time: 15 minutes  Indications: 65 year old woman with right second third and fourth in flexible hammertoes he desires elective degrees arthroplasties to decrease pain, decrease calluses and increase function. More than 5 years ago she had a DeVries arthroplasty done on the left second toe and has done great ever since. The risks and benefits of surgery been discussed questions answered and encouraged.  Procedure: Patient identified by arm band taken to [facility] 8 at [facility] appropriate anesthetic monitors were attached and general LMA anesthesia induced with the patient in the supine position. Tourniquet was applied to the right ankle on the right foot prepped and draped in usual sterile fashion from the toes to the tourniquet. Timeout procedure was performed. Limb was wrapped with an Esmarch bandage tourniquet inflated to 250 mmHg and we began the operation by placing a local field block 10 mL of half percent Marcaine and epinephrine solution in the interspaces between toes 1 into 2 and 3 as well as 3 and 4 and 4 and 5. We then made elliptical incisions through the skin subcutaneous tissue and extensor tendons over the second third and fourth proximal interphalangeal joints exposing the heads of the proximal phalange ease. The has were then removed with a small oscillating saw and the edges trimmed proximally and distally so that when the ellipse was closed the toes had a normal appearance. Using a Soil scientist we also released the extensor tendons dorsally to prevent a cockup  deformity at the MTP joints. The wounds were then irrigated with normal saline solution. Tourniquet was let down and minimal bleeding was noted. We closed the 2-0 Vicryl horizontal mattress sutures tensioning to get the toes with neutral position. A dressing of tweezers between the toes 4 x 4's web roll and Ace wrap was then applied followed by a postop shoe. Patient was awakened extubated and taken to the recovery room.

## 2016-07-24 ENCOUNTER — Encounter (HOSPITAL_BASED_OUTPATIENT_CLINIC_OR_DEPARTMENT_OTHER): Payer: Self-pay | Admitting: Orthopedic Surgery

## 2016-07-30 ENCOUNTER — Ambulatory Visit: Payer: Medicaid Other | Admitting: Diagnostic Neuroimaging

## 2016-08-09 ENCOUNTER — Other Ambulatory Visit: Payer: Self-pay | Admitting: Internal Medicine

## 2016-08-28 ENCOUNTER — Encounter: Payer: Self-pay | Admitting: Internal Medicine

## 2016-08-28 ENCOUNTER — Ambulatory Visit: Payer: Medicaid Other | Attending: Internal Medicine | Admitting: Internal Medicine

## 2016-08-28 VITALS — BP 98/63 | HR 84 | Temp 97.9°F | Resp 18 | Ht 68.0 in | Wt 135.8 lb

## 2016-08-28 DIAGNOSIS — R109 Unspecified abdominal pain: Secondary | ICD-10-CM | POA: Diagnosis not present

## 2016-08-28 DIAGNOSIS — F329 Major depressive disorder, single episode, unspecified: Secondary | ICD-10-CM | POA: Insufficient documentation

## 2016-08-28 DIAGNOSIS — F32A Depression, unspecified: Secondary | ICD-10-CM

## 2016-08-28 DIAGNOSIS — Z79899 Other long term (current) drug therapy: Secondary | ICD-10-CM | POA: Insufficient documentation

## 2016-08-28 DIAGNOSIS — Z888 Allergy status to other drugs, medicaments and biological substances status: Secondary | ICD-10-CM | POA: Insufficient documentation

## 2016-08-28 DIAGNOSIS — F419 Anxiety disorder, unspecified: Secondary | ICD-10-CM | POA: Insufficient documentation

## 2016-08-28 DIAGNOSIS — N390 Urinary tract infection, site not specified: Secondary | ICD-10-CM | POA: Diagnosis present

## 2016-08-28 DIAGNOSIS — K219 Gastro-esophageal reflux disease without esophagitis: Secondary | ICD-10-CM | POA: Insufficient documentation

## 2016-08-28 DIAGNOSIS — G2581 Restless legs syndrome: Secondary | ICD-10-CM | POA: Diagnosis not present

## 2016-08-28 LAB — POCT URINALYSIS DIPSTICK
Bilirubin, UA: NEGATIVE
Glucose, UA: NEGATIVE
KETONES UA: NEGATIVE
LEUKOCYTES UA: NEGATIVE
NITRITE UA: NEGATIVE
PH UA: 5.5
PROTEIN UA: NEGATIVE
Spec Grav, UA: 1.01
UROBILINOGEN UA: 0.2

## 2016-08-28 MED ORDER — TRAZODONE HCL 100 MG PO TABS: 100.0000 mg | ORAL_TABLET | Freq: Every evening | ORAL | 3 refills | Status: DC | PRN

## 2016-08-28 MED ORDER — SERTRALINE HCL 25 MG PO TABS: 25.0000 mg | ORAL_TABLET | Freq: Every day | ORAL | 3 refills | Status: DC

## 2016-08-28 NOTE — Patient Instructions (Signed)
Generalized Anxiety Disorder Generalized anxiety disorder (GAD) is a mental disorder. It interferes with life functions, including relationships, work, and school. GAD is different from normal anxiety, which everyone experiences at some point in their lives in response to specific life events and activities. Normal anxiety actually helps us prepare for and get through these life events and activities. Normal anxiety goes away after the event or activity is over.  GAD causes anxiety that is not necessarily related to specific events or activities. It also causes excess anxiety in proportion to specific events or activities. The anxiety associated with GAD is also difficult to control. GAD can vary from mild to severe. People with severe GAD can have intense waves of anxiety with physical symptoms (panic attacks).  SYMPTOMS The anxiety and worry associated with GAD are difficult to control. This anxiety and worry are related to many life events and activities and also occur more days than not for 6 months or longer. People with GAD also have three or more of the following symptoms (one or more in children):  Restlessness.   Fatigue.  Difficulty concentrating.   Irritability.  Muscle tension.  Difficulty sleeping or unsatisfying sleep. DIAGNOSIS GAD is diagnosed through an assessment by your health care provider. Your health care provider will ask you questions aboutyour mood,physical symptoms, and events in your life. Your health care provider may ask you about your medical history and use of alcohol or drugs, including prescription medicines. Your health care provider may also do a physical exam and blood tests. Certain medical conditions and the use of certain substances can cause symptoms similar to those associated with GAD. Your health care provider may refer you to a mental health specialist for further evaluation. TREATMENT The following therapies are usually used to treat GAD:    Medication. Antidepressant medication usually is prescribed for long-term daily control. Antianxiety medicines may be added in severe cases, especially when panic attacks occur.   Talk therapy (psychotherapy). Certain types of talk therapy can be helpful in treating GAD by providing support, education, and guidance. A form of talk therapy called cognitive behavioral therapy can teach you healthy ways to think about and react to daily life events and activities.  Stress managementtechniques. These include yoga, meditation, and exercise and can be very helpful when they are practiced regularly. A mental health specialist can help determine which treatment is best for you. Some people see improvement with one therapy. However, other people require a combination of therapies.   This information is not intended to replace advice given to you by your health care provider. Make sure you discuss any questions you have with your health care provider.   Document Released: 03/28/2013 Document Revised: 12/22/2014 Document Reviewed: 03/28/2013 Elsevier Interactive Patient Education 2016 Elsevier Inc.  

## 2016-08-28 NOTE — Progress Notes (Signed)
Gail Gilbert, is a 65 y.o. female  [id number]  [id number]  DOB - [birth year]  Chief Complaint  Patient presents with  . Urinary Tract Infection      Subjective:   Gail Gilbert is a 65 y.o. female with history of idiopathic transverse myelitis, major depression and anxiety, restless leg syndrome, spondylolisthesis and GERD  here today for a follow up visit. Patient complained of suprapubic pain for about one week, she thinks she has UTI and wants to be tested. She has no other complain, no fever, no dysuria, no frequency. She has no change in bowel habit, no constipation, no abdominal distension. Patient has No headache, No chest pain, No Nausea, No new weakness tingling or numbness, No Cough, SOB. She also needs refill of her trazodone. Patient said she had used Zoloft in the past for depression, she wants to try a small dose to help with her mood. She denies any suicidal ideation or thought.  Problem  Ap (Abdominal Pain)   ALLERGIES: Allergies  Allergen Reactions  . Antihistamines, Diphenhydramine-Type Palpitations   PAST MEDICAL HISTORY: Past Medical History:  Diagnosis Date  . Cataract, immature   . Dental crowns present   . Hammer toe of right foot 07/2016   2nd and 3rd toes  . IBS (irritable bowel syndrome)    no current problems  . Idiopathic transverse myelitis ([place]) 2014   residual numbness feet and right small finger  . Osteoarthritis    cervical spine, lumbar spine  . RLS (restless legs syndrome)    no current med.   MEDICATIONS AT HOME: Prior to Admission medications   Medication Sig Start Date End Date Taking? Authorizing Provider  Multiple Vitamin (MULTIVITAMIN) capsule Take 1 capsule by mouth daily.     Yes Historical Provider, MD  naproxen sodium (ANAPROX) 220 MG tablet Take 440 mg by mouth daily.   Yes Historical Provider, MD  traZODone (DESYREL) 100 MG tablet Take 1 tablet (100 mg total) by mouth at bedtime as needed for sleep. 08/28/16  Yes  Tresa Garter, MD  sertraline (ZOLOFT) 25 MG tablet Take 1 tablet (25 mg total) by mouth daily. 08/28/16   Tresa Garter, MD   Objective:   Vitals:   08/28/16 1449  BP: 98/63  Pulse: 84  Resp: 18  Temp: 97.9 F (36.6 C)  TempSrc: Oral  SpO2: 96%  Weight: 135 lb 12.8 oz (61.6 kg)  Height: 5\' 8"  (1.727 m)   Exam General appearance : Awake, alert, not in any distress. Speech Clear. Not toxic looking HEENT: Atraumatic and Normocephalic, pupils equally reactive to light and accomodation Neck: Supple, no JVD. No cervical lymphadenopathy.  Chest: Good air entry bilaterally, no added sounds  CVS: S1 S2 regular, no murmurs.  Abdomen: Bowel sounds present, Non tender and not distended with no gaurding, rigidity or rebound. Extremities: B/L Lower Ext shows no edema, both legs are warm to touch Neurology: Awake alert, and oriented X 3, CN II-XII intact, Non focal Skin: No Rash  Data Review Lab Results  Component Value Date   HGBA1C 5.40 09/17/2015   Assessment & Plan   1. Abdominal pain, unspecified abdominal location  - Urinalysis Dipstick is negative for UTI - No treatment needed - Patient counseled  2. Depression (emotion)  - sertraline (ZOLOFT) 25 MG tablet; Take 1 tablet (25 mg total) by mouth daily.  Dispense: 90 tablet; Refill: 3 - traZODone (DESYREL) 100 MG tablet; Take 1 tablet (100 mg total) by mouth at bedtime  as needed for sleep.  Dispense: 90 tablet; Refill: 3  Patient have been counseled extensively about nutrition and exercise  Return in about 6 months (around 02/25/2017) for Follow up Pain and comorbidities, Annual Physical.  The patient was given clear instructions to go to ER or return to medical center if symptoms don't improve, worsen or new problems develop. The patient verbalized understanding. The patient was told to call to get lab results if they haven't heard anything in the next week.   This note has been created with Biomedical engineer. Any transcriptional errors are unintentional.    Angelica Chessman, MD, [place], Karilyn Cota, [place] [facility] [place], [place]   08/28/2016, 4:17 PM

## 2016-08-28 NOTE — Progress Notes (Signed)
Patient is here for nerve pain  Patient complains of lower abdominal pain being present over the past week.  Patient has taken medication today and patient has eaten today.

## 2016-09-01 ENCOUNTER — Telehealth: Payer: Self-pay | Admitting: Internal Medicine

## 2016-09-01 DIAGNOSIS — R1310 Dysphagia, unspecified: Secondary | ICD-10-CM

## 2016-09-01 NOTE — Telephone Encounter (Signed)
Pt came in asking about a referral being placed to the ENT specialist  I did not see a referral in the system but informed pt that it can take up to a few weeks to be contacted  Pt states a referral was discussed in last visit with PCP

## 2016-09-02 NOTE — Telephone Encounter (Signed)
Patient verified DOB Patient states she request ENT referral for stretched sphincter and pain while swallowing. Patient complains of acid reflux when eating certain and foods. Patient also complains of recent night sweats and would like routine blood work completed to see if something is "off". MA informed patient of PCP being routed her concerns and upon his return she will be advised. No further questions at this time.

## 2016-09-02 NOTE — Telephone Encounter (Signed)
Pt. Returned call. Please f/u °

## 2016-09-18 ENCOUNTER — Encounter: Payer: Self-pay | Admitting: Internal Medicine

## 2016-09-23 DIAGNOSIS — R1319 Other dysphagia: Secondary | ICD-10-CM | POA: Insufficient documentation

## 2016-09-23 DIAGNOSIS — R1310 Dysphagia, unspecified: Secondary | ICD-10-CM | POA: Insufficient documentation

## 2016-09-23 DIAGNOSIS — R196 Halitosis: Secondary | ICD-10-CM | POA: Insufficient documentation

## 2016-09-23 DIAGNOSIS — J320 Chronic maxillary sinusitis: Secondary | ICD-10-CM | POA: Insufficient documentation

## 2016-10-07 ENCOUNTER — Other Ambulatory Visit: Payer: Self-pay | Admitting: Internal Medicine

## 2016-10-07 DIAGNOSIS — F32A Depression, unspecified: Secondary | ICD-10-CM

## 2016-10-07 DIAGNOSIS — F329 Major depressive disorder, single episode, unspecified: Secondary | ICD-10-CM

## 2016-11-10 DIAGNOSIS — H43812 Vitreous degeneration, left eye: Secondary | ICD-10-CM | POA: Insufficient documentation

## 2016-11-19 ENCOUNTER — Other Ambulatory Visit: Payer: Self-pay | Admitting: Internal Medicine

## 2016-11-19 DIAGNOSIS — F32A Depression, unspecified: Secondary | ICD-10-CM

## 2016-11-19 DIAGNOSIS — F329 Major depressive disorder, single episode, unspecified: Secondary | ICD-10-CM

## 2016-12-24 ENCOUNTER — Encounter: Payer: Self-pay | Admitting: Internal Medicine

## 2016-12-24 ENCOUNTER — Ambulatory Visit: Payer: Medicare Other | Attending: Internal Medicine | Admitting: Internal Medicine

## 2016-12-24 VITALS — BP 115/71 | HR 77 | Temp 98.5°F | Resp 18 | Ht 68.0 in | Wt 143.4 lb

## 2016-12-24 DIAGNOSIS — F419 Anxiety disorder, unspecified: Secondary | ICD-10-CM | POA: Diagnosis not present

## 2016-12-24 DIAGNOSIS — H43392 Other vitreous opacities, left eye: Secondary | ICD-10-CM | POA: Diagnosis not present

## 2016-12-24 DIAGNOSIS — G373 Acute transverse myelitis in demyelinating disease of central nervous system: Secondary | ICD-10-CM

## 2016-12-24 DIAGNOSIS — G2581 Restless legs syndrome: Secondary | ICD-10-CM | POA: Insufficient documentation

## 2016-12-24 DIAGNOSIS — K219 Gastro-esophageal reflux disease without esophagitis: Secondary | ICD-10-CM | POA: Diagnosis not present

## 2016-12-24 DIAGNOSIS — F331 Major depressive disorder, recurrent, moderate: Secondary | ICD-10-CM | POA: Diagnosis not present

## 2016-12-24 DIAGNOSIS — Z79899 Other long term (current) drug therapy: Secondary | ICD-10-CM | POA: Diagnosis not present

## 2016-12-24 DIAGNOSIS — M1990 Unspecified osteoarthritis, unspecified site: Secondary | ICD-10-CM | POA: Insufficient documentation

## 2016-12-24 DIAGNOSIS — E785 Hyperlipidemia, unspecified: Secondary | ICD-10-CM | POA: Diagnosis not present

## 2016-12-24 LAB — COMPLETE METABOLIC PANEL WITH GFR
ALBUMIN: 4 g/dL (ref 3.6–5.1)
ALK PHOS: 77 U/L (ref 33–130)
ALT: 15 U/L (ref 6–29)
AST: 21 U/L (ref 10–35)
BUN: 17 mg/dL (ref 7–25)
CO2: 24 mmol/L (ref 20–31)
CREATININE: 0.62 mg/dL (ref 0.50–0.99)
Calcium: 9.3 mg/dL (ref 8.6–10.4)
Chloride: 104 mmol/L (ref 98–110)
GFR, Est African American: >89 mL/min (ref >=60)
GFR, Est Non African American: >89 mL/min (ref >=60)
GLUCOSE: 92 mg/dL (ref 65–99)
POTASSIUM: 4.7 mmol/L (ref 3.5–5.3)
SODIUM: 139 mmol/L (ref 135–146)
TOTAL PROTEIN: 6.4 g/dL (ref 6.1–8.1)
Total Bilirubin: 0.3 mg/dL (ref 0.2–1.2)

## 2016-12-24 LAB — LIPID PANEL
CHOL/HDL RATIO: 2.7 ratio (ref <5.0)
Cholesterol: 243 mg/dL — ABNORMAL HIGH (ref <200)
HDL: 91 mg/dL (ref >50)
LDL Cholesterol: 138 mg/dL — ABNORMAL HIGH (ref <100)
Triglycerides: 72 mg/dL (ref <150)
VLDL: 14 mg/dL (ref <30)

## 2016-12-24 MED ORDER — TRAZODONE HCL 100 MG PO TABS
50.0000 mg | ORAL_TABLET | Freq: Every day | ORAL | 3 refills | Status: DC
Start: 2016-12-24 — End: 2018-02-19

## 2016-12-24 NOTE — Progress Notes (Signed)
Patient is here for Physical  Patient denies pain at this time.  Patient has taken medication today. Patient has eaten today.  Patient declined the flu vaccine.

## 2016-12-24 NOTE — Progress Notes (Signed)
Gail Gilbert, is a 66 y.o. female  [id number]  [id number]  DOB - Mar 27, 1951  Chief Complaint  Patient presents with  . Annual Exam      Subjective:   Gail Gilbert is a 66 y.o. female with history of idiopathic transverse myelitis, major depression and anxiety, restless leg syndrome, spondylolisthesis and GERD here today for a follow up visit and medication refills. Patient was visiting relatives out of state recently, developed sudden feelings of floaters in her left eye, went to the ED, evaluated, was told of some retinal disease and advised to follow up with ophthalmologist. She claims that she now sees normally and no more floaters. She has no blurry vision or double vision. She actually prefers not to follow up with ophthalmologist at this time because she :feels fine". She is mainly here to recheck her cholesterol and to refill her trazodone. She denies any suicidal ideation or thought. Patient has No headache, No chest pain, No abdominal pain - No Nausea, No new weakness tingling or numbness, No Cough - SOB. She is due for pap smear.   Problem  Floaters in Visual Field, Left   ALLERGIES: Allergies  Allergen Reactions  . Antihistamines, Diphenhydramine-Type Palpitations    PAST MEDICAL HISTORY: Past Medical History:  Diagnosis Date  . Cataract, immature   . Dental crowns present   . Hammer toe of right foot 07/2016   2nd and 3rd toes  . IBS (irritable bowel syndrome)    no current problems  . Idiopathic transverse myelitis ([place]) 2014   residual numbness feet and right small finger  . Osteoarthritis    cervical spine, lumbar spine  . RLS (restless legs syndrome)    no current med.   MEDICATIONS AT HOME: Prior to Admission medications   Medication Sig Start Date End Date Taking? Authorizing Provider  Multiple Vitamin (MULTIVITAMIN) capsule Take 1 capsule by mouth daily.     Yes Historical Provider, MD  naproxen sodium (ANAPROX) 220 MG tablet Take 440 mg by mouth  daily.   Yes Historical Provider, MD  sertraline (ZOLOFT) 25 MG tablet Take 1 tablet (25 mg total) by mouth daily. 08/28/16  Yes Tresa Garter, MD  traZODone (DESYREL) 100 MG tablet Take 0.5 tablets (50 mg total) by mouth at bedtime. 12/24/16  Yes Tresa Garter, MD   Objective:   Vitals:   12/24/16 1005  BP: 115/71  Pulse: 77  Resp: 18  Temp: 98.5 F (36.9 C)  TempSrc: Oral  SpO2: 96%  Weight: 143 lb 6.4 oz (65 kg)  Height: 5\' 8"  (1.727 m)   Exam General appearance : Awake, alert, not in any distress. Speech Clear. Not toxic looking HEENT: Atraumatic and Normocephalic, pupils equally reactive to light and accomodation Neck: Supple, no JVD. No cervical lymphadenopathy.  Chest: Good air entry bilaterally, no added sounds  CVS: S1 S2 regular, no murmurs.  Abdomen: Bowel sounds present, Non tender and not distended with no gaurding, rigidity or rebound. Extremities: B/L Lower Ext shows no edema, both legs are warm to touch Neurology: Awake alert, and oriented X 3, CN II-XII intact, Non focal Skin: No Rash  Data Review Lab Results  Component Value Date   HGBA1C 5.40 09/17/2015    Assessment & Plan   1. Moderate episode of recurrent major depressive disorder (HCC)  - traZODone (DESYREL) 100 MG tablet; Take 0.5 tablets (50 mg total) by mouth at bedtime.  Dispense: 45 tablet; Refill: 3  2. Idiopathic transverse myelitis ([place])  -  COMPLETE METABOLIC PANEL WITH GFR - Lipid panel  3. Floaters in visual field, left  - Ambulatory referral to Ophthalmology  Patient have been counseled extensively about nutrition and exercise. Other issues discussed during this visit include: low cholesterol diet, weight control and daily exercise, annual eye examinations at Ophthalmology, importance of adherence with medications and regular follow-up.   Return in about 2 weeks (around 01/07/2017) for Pap Smear.  The patient was given clear instructions to go to ER or return to medical  center if symptoms don't improve, worsen or new problems develop. The patient verbalized understanding. The patient was told to call to get lab results if they haven't heard anything in the next week.   This note has been created with Surveyor, quantity. Any transcriptional errors are unintentional.    Angelica Chessman, MD, [place], [place], [place], [place] and [place], [place]   12/24/2016, 10:52 AM

## 2016-12-24 NOTE — Patient Instructions (Signed)
Eye Floaters Introduction Eye floaters are specks of material that float around inside your eye. A jelly-like fluid (vitreous) fills the inside of your eye. The vitreous is normally clear. It allows light to pass through to tissues at the back of the eye (retina). The retina contains the nerves needed for vision. Your vitreous can start to shrink and become stringy as you age. Strands of material may start to float around inside the eye. They come from clumps of cells, blood, or other materials. These objects cast shadows on the retina and show up as floaters. Floaters may be more obvious when you look up at the sky or at a bright, blank background. They do not go away completely. In time, however, they may settle below your line of sight. Floaters can be annoying. They do not usually cause vision problems. Sometimes floaters appear along with flashes. Flashes look like bright, quick streaks of light. They usually occur at the edge of your vision. Flashes result when your vitreous pulls on your retina. They also occur with age. However, they could be a warning sign of a detached retina. This is a serious condition that requires emergency treatment to prevent vision loss. What are the causes? For most people, eye floaters develop when the vitreous begins to shrink as a normal part of aging. More serious causes of floaters include:  A torn retina.  Injury.  Bleeding inside the eye. Diabetes and other conditions can cause broken retinal blood vessels.  A blood clot in the major vein of the retina or its branches (retinal vein occlusion).  Retinal detachment.  Vitreous detachment.  Inflammation inside the eye (uveitis).  Infection inside the eye. What increases the risk? You may have a higher risk for floaters if:  You are older.  You are nearsighted.  You have diabetes.  You have had cataracts removed. What are the signs or symptoms? Symptoms of floaters include seeing small, shadowy  shapes move across your vision. They move as your eyes move. They drift out of your vision when you keep your eyes still. These shapes may look like:  Specks.  Dots.  Circles.  Squiggly lines.  Thread. Symptoms of flashes include seeing:  Bursts of light.  Flashing lights.  Lightning streaks.  What is commonly referred to as "stars." How is this diagnosed? Your health care provider may diagnose floaters and flashes based on your symptoms. You may need to see an eye care specialist (optometrist or ophthalmologist). The specialist will do an exam to determine whether your floaters are a normal part of aging or a warning sign of a more serious eye problem. The specialist may put drops in your eyes to open your pupils wide (dilate) and then use a special scope (slit lamp) to look inside your eye. How is this treated? No treatment is needed for floaters that occur normally with age. Sometimes floaters become severe enough to affect your vision. In rare cases, surgery to remove the vitreous and replace it with a saltwater solution (vitrectomy) may be considered. Follow these instructions at home: Keep all follow-up visits as directed by your health care provider. This is important. Contact a health care provider if:  You have a sudden increase in floaters.  You have floaters along with flashes.  You have floaters along with any new eye symptoms. Get help right away if:  You have a sudden increase in floaters or flashes that interferes with your vision.  Your vision suddenly changes. This information is not  intended to replace advice given to you by your health care provider. Make sure you discuss any questions you have with your health care provider. Document Released: 12/04/2003 Document Revised: 05/08/2016 Document Reviewed: 07/26/2014  2017 Elsevier Major Depressive Disorder, Adult Major depressive disorder (MDD) is a mental health condition. MDD often makes you feel sad,  hopeless, or helpless. MDD can also cause symptoms in your body. MDD can affect your:  Work.  School.  Relationships.  Other normal activities. MDD can range from mild to very bad. It may occur once (single episode MDD). It can also occur many times (recurrent MDD). The main symptoms of MDD often include:  Feeling sad, depressed, or irritable most of the time.  Loss of interest. MDD symptoms also include:  Sleeping too much or too little.  Eating too much or too little.  A change in your weight.  Feeling tired (fatigue) or having low energy.  Feeling worthless.  Feeling guilty.  Trouble making decisions.  Trouble thinking clearly.  Thoughts of suicide or harming others.  Feeling weak.  Feeling agitated.  Keeping yourself from being around other people (isolation). Follow these instructions at home: Activity  Do these things as told by your doctor:  Go back to your normal activities.  Exercise regularly.  Spend time outdoors. Alcohol  Talk with your doctor about how alcohol can affect your antidepressant medicines.  Do not drink alcohol. Or, limit how much alcohol you drink.  This means no more than 1 drink a day for nonpregnant women and 2 drinks a day for men. One drink equals one of these:  12 oz of beer.  5 oz of wine.  1 oz of hard liquor. General instructions  Take over-the-counter and prescription medicines only as told by your doctor.  Eat a healthy diet.  Get plenty of sleep.  Find activities that you enjoy. Make time to do them.  Think about joining a support group. Your doctor may be able to suggest a group for you.  Keep all follow-up visits as told by your doctor. This is important. Where to find more information:  [employer] on Mental Illness:  www.nami.org  U.S. National Institute of Mental Health:  https://carter.com/  National Suicide Prevention Lifeline:  6572187069. This is free, 24-hour help. Contact  a doctor if:  Your symptoms get worse.  You have new symptoms. Get help right away if:  You self-harm.  You see, hear, taste, smell, or feel things that are not present (hallucinate). If you ever feel like you may hurt yourself or others, or have thoughts about taking your own life, get help right away. You can go to your nearest emergency department or call:  Your local emergency services (911 in the [place]  A suicide crisis helpline, such as the [place]:  403-867-1390. This is open 24 hours a day. This information is not intended to replace advice given to you by your health care provider. Make sure you discuss any questions you have with your health care provider. Document Released: 11/12/2015 Document Revised: 08/17/2016 Document Reviewed: 08/17/2016 Elsevier Interactive Patient Education  2017 [employer].

## 2016-12-25 ENCOUNTER — Encounter: Payer: Self-pay | Admitting: Internal Medicine

## 2016-12-26 ENCOUNTER — Telehealth: Payer: Self-pay | Admitting: *Deleted

## 2016-12-26 NOTE — Telephone Encounter (Signed)
-----   Message from Tresa Garter, MD sent at 12/25/2016  4:08 PM EST ----- Please inform patient that her lab results are normal except for the cholesterol. To address this please limit saturated fat to no more than 7% of your calories, limit cholesterol to 200 mg/day, increase fiber and exercise as tolerated. If needed we may add a cholesterol lowering medication to your regimen.

## 2016-12-26 NOTE — Telephone Encounter (Signed)
MA informed patient of labs via voicemail. Patient is aware of labs being normal except for cholesterol being high. Patient advised to increase fiber and exercise and to limit saturated fat intake. Patient was provided the clinic number for any further questions.

## 2016-12-30 ENCOUNTER — Telehealth: Payer: Self-pay | Admitting: *Deleted

## 2016-12-30 NOTE — Telephone Encounter (Signed)
-----   Message from Tresa Garter, MD sent at 12/25/2016  4:08 PM EST ----- Please inform patient that her lab results are normal except for the cholesterol. To address this please limit saturated fat to no more than 7% of your calories, limit cholesterol to 200 mg/day, increase fiber and exercise as tolerated. If needed we may add a cholesterol lowering medication to your regimen.

## 2016-12-30 NOTE — Telephone Encounter (Signed)
Patient is aware of lab results being normal except for cholesterol level being high.Patient advised to limit saturated fat and cholesterol intake while increasing exercise and fiber. Patent is aware of a recheck being completed and if the levels are not dropping and lowering medication may be added to the patients regimen. MA provided the [facility] telephone number [phone] for any concerns.

## 2017-01-14 ENCOUNTER — Other Ambulatory Visit (HOSPITAL_COMMUNITY)
Admission: RE | Admit: 2017-01-14 | Discharge: 2017-01-14 | Disposition: A | Discharge status: Home / Self Care | Payer: Medicare Other | Source: Ambulatory Visit | Attending: Internal Medicine | Admitting: Internal Medicine

## 2017-01-14 ENCOUNTER — Ambulatory Visit: Payer: Medicare Other | Attending: Internal Medicine | Admitting: Internal Medicine

## 2017-01-14 ENCOUNTER — Encounter: Payer: Self-pay | Admitting: Internal Medicine

## 2017-01-14 VITALS — BP 130/80 | HR 74 | Temp 98.4°F | Wt 142.8 lb

## 2017-01-14 DIAGNOSIS — Z888 Allergy status to other drugs, medicaments and biological substances status: Secondary | ICD-10-CM | POA: Insufficient documentation

## 2017-01-14 DIAGNOSIS — M1990 Unspecified osteoarthritis, unspecified site: Secondary | ICD-10-CM | POA: Diagnosis not present

## 2017-01-14 DIAGNOSIS — K219 Gastro-esophageal reflux disease without esophagitis: Secondary | ICD-10-CM | POA: Diagnosis not present

## 2017-01-14 DIAGNOSIS — Z01419 Encounter for gynecological examination (general) (routine) without abnormal findings: Secondary | ICD-10-CM | POA: Insufficient documentation

## 2017-01-14 DIAGNOSIS — R896 Abnormal cytological findings in specimens from other organs, systems and tissues: Secondary | ICD-10-CM | POA: Diagnosis present

## 2017-01-14 DIAGNOSIS — F419 Anxiety disorder, unspecified: Secondary | ICD-10-CM | POA: Diagnosis not present

## 2017-01-14 DIAGNOSIS — Z1151 Encounter for screening for human papillomavirus (HPV): Secondary | ICD-10-CM | POA: Insufficient documentation

## 2017-01-14 DIAGNOSIS — N814 Uterovaginal prolapse, unspecified: Secondary | ICD-10-CM | POA: Insufficient documentation

## 2017-01-14 DIAGNOSIS — G2581 Restless legs syndrome: Secondary | ICD-10-CM | POA: Insufficient documentation

## 2017-01-14 DIAGNOSIS — Z113 Encounter for screening for infections with a predominantly sexual mode of transmission: Secondary | ICD-10-CM | POA: Insufficient documentation

## 2017-01-14 DIAGNOSIS — F329 Major depressive disorder, single episode, unspecified: Secondary | ICD-10-CM | POA: Insufficient documentation

## 2017-01-14 DIAGNOSIS — Z124 Encounter for screening for malignant neoplasm of cervix: Secondary | ICD-10-CM | POA: Insufficient documentation

## 2017-01-14 DIAGNOSIS — Z79899 Other long term (current) drug therapy: Secondary | ICD-10-CM | POA: Diagnosis not present

## 2017-01-14 MED ORDER — OMEPRAZOLE 40 MG PO CPDR: 40.0000 mg | DELAYED_RELEASE_CAPSULE | Freq: Every day | ORAL | 3 refills | Status: DC

## 2017-01-14 NOTE — Progress Notes (Signed)
Gail Gilbert, is a 66 y.o. female  [id number]  [id number]  DOB - 12-02-1951  Chief Complaint  Patient presents with  . Abnormal Pap Smear      Subjective:   Gail Gilbert is a 66 y.o. female with history of idiopathic transverse myelitis, major depression and anxiety, restless leg syndrome, spondylolisthesis and GERD here today for pap smear only. No new complaint. Patient has No headache, No chest pain, No abdominal pain - No Nausea, No new weakness tingling or numbness, No Cough - SOB.  Problem  Pap Smear for Cervical Cancer Screening  Uterine Prolapse    ALLERGIES: Allergies  Allergen Reactions  . Antihistamines, Diphenhydramine-Type Palpitations    PAST MEDICAL HISTORY: Past Medical History:  Diagnosis Date  . Cataract, immature   . Dental crowns present   . Hammer toe of right foot 07/2016   2nd and 3rd toes  . IBS (irritable bowel syndrome)    no current problems  . Idiopathic transverse myelitis ([place]) 2014   residual numbness feet and right small finger  . Osteoarthritis    cervical spine, lumbar spine  . RLS (restless legs syndrome)    no current med.    MEDICATIONS AT HOME: Prior to Admission medications   Medication Sig Start Date End Date Taking? Authorizing Provider  Multiple Vitamin (MULTIVITAMIN) capsule Take 1 capsule by mouth daily.     Yes Historical Provider, MD  sertraline (ZOLOFT) 25 MG tablet Take 1 tablet (25 mg total) by mouth daily. 08/28/16  Yes Tresa Garter, MD  traZODone (DESYREL) 100 MG tablet Take 0.5 tablets (50 mg total) by mouth at bedtime. 12/24/16  Yes Tresa Garter, MD  naproxen sodium (ANAPROX) 220 MG tablet Take 440 mg by mouth daily.    Historical Provider, MD  omeprazole (PRILOSEC) 40 MG capsule Take 1 capsule (40 mg total) by mouth daily. 01/14/17   Tresa Garter, MD    Objective:   Vitals:   01/14/17 1512  BP: 130/80  Pulse: 74  Temp: 98.4 F (36.9 C)  TempSrc: Oral  SpO2: 96%  Weight: 142  lb 12.8 oz (64.8 kg)   Exam General appearance : Awake, alert, not in any distress. Speech Clear. Not toxic looking HEENT: Atraumatic and Normocephalic, pupils equally reactive to light and accomodation Neck: Supple, no JVD. No cervical lymphadenopathy.  Chest: Good air entry bilaterally, no added sounds  CVS: S1 S2 regular, no murmurs.  Abdomen: Bowel sounds present, Non tender and not distended with no gaurding, rigidity or rebound. Extremities: B/L Lower Ext shows no edema, both legs are warm to touch Neurology: Awake alert, and oriented X 3, CN II-XII intact, Non focal  Pelvic Exam: Cervix normal in appearance, external genitalia normal, no adnexal masses or tenderness, no cervical motion tenderness, rectovaginal septum normal, uterus normal size, shape, and consistency and vagina normal without discharge. Cystocele, uterine prolapse +  Data Review Lab Results  Component Value Date   HGBA1C 5.40 09/17/2015    Assessment & Plan   1. Pap smear for cervical cancer screening  - Cytology - PAP - Cervicovaginal ancillary only  2. Uterine prolapse  - Ambulatory referral to Gynecology  Patient have been counseled extensively about nutrition and exercise.    Return in about 6 months (around 07/14/2017) for Routine Follow Up.  The patient was given clear instructions to go to ER or return to medical center if symptoms don't improve, worsen or new problems develop. The patient verbalized understanding. The patient was  told to call to get lab results if they haven't heard anything in the next week.   This note has been created with Surveyor, quantity. Any transcriptional errors are unintentional.    Angelica Chessman, MD, [place], [place], [place] and [place], [place]   01/14/2017, 3:51 PM

## 2017-01-14 NOTE — Patient Instructions (Signed)

## 2017-01-15 LAB — CYTOLOGY - PAP: DIAGNOSIS: NEGATIVE

## 2017-01-15 LAB — CERVICOVAGINAL ANCILLARY ONLY
Bacterial vaginitis: NEGATIVE
Candida vaginitis: NEGATIVE
Chlamydia: NEGATIVE
Neisseria Gonorrhea: NEGATIVE
Trichomonas: NEGATIVE

## 2017-01-20 ENCOUNTER — Encounter: Payer: Self-pay | Admitting: Obstetrics & Gynecology

## 2017-02-02 ENCOUNTER — Telehealth: Payer: Self-pay | Admitting: *Deleted

## 2017-02-02 NOTE — Telephone Encounter (Signed)
-----   Message from Tresa Garter, MD sent at 01/23/2017 10:18 AM EST ----- Please inform patient that her pap smear is negative for malignancy, and negative for infection

## 2017-02-02 NOTE — Telephone Encounter (Signed)
Patient verified DOB Patient is aware of Pap being negative for malignancy and infection. Patient expressed her understanding and had no further questions at this time.

## 2017-02-18 ENCOUNTER — Encounter: Payer: Self-pay | Admitting: General Practice

## 2017-02-20 ENCOUNTER — Encounter: Payer: Medicare Other | Admitting: Obstetrics & Gynecology

## 2017-03-04 ENCOUNTER — Encounter: Payer: Medicare Other | Admitting: Obstetrics & Gynecology

## 2017-03-25 ENCOUNTER — Encounter: Payer: Self-pay | Admitting: Obstetrics & Gynecology

## 2017-03-25 ENCOUNTER — Ambulatory Visit (INDEPENDENT_AMBULATORY_CARE_PROVIDER_SITE_OTHER): Payer: PPO | Admitting: Obstetrics & Gynecology

## 2017-03-25 VITALS — BP 119/65 | HR 89 | Wt 143.3 lb

## 2017-03-25 DIAGNOSIS — N812 Incomplete uterovaginal prolapse: Secondary | ICD-10-CM | POA: Diagnosis not present

## 2017-03-25 DIAGNOSIS — Z1231 Encounter for screening mammogram for malignant neoplasm of breast: Secondary | ICD-10-CM | POA: Diagnosis not present

## 2017-03-25 NOTE — Progress Notes (Signed)
Referral to [facility] made for first available appointment for 05/06/17 9:15 Dr. McDiarmid.

## 2017-03-25 NOTE — Progress Notes (Signed)
   Subjective:    Patient ID: Gail Gilbert, female    DOB: 1951/03/18, 66 y.o.   MRN: [id number]  HPI 66 yo MW P5 here with the issue of a pelvic prolapse. This annoys her sporadically, it was noted by her primary care MD recently. She denies GSUI, occasional urge incontinence. She denies dyspareunia.   Review of Systems Mammogram due She has at least 2 occasions of nocturia, this is her most annoying problem at today's visit.   Spends summers in [place], visiting son Objective:   Physical Exam  [id number] Appears younger than stated age 53- benign 2/3rd degree cystocele, 0 rectocele 1/2 nd uterine prolapse Good pubic arch NSSA, mobile uterus, non-enlarged adnexa      Assessment & Plan:  Preventative care- mammogram ordered Cystocele- discussed options including Kegels, watchful waiting, pessary, verus TVH/ anterior repair UI and nocturia- refer to urologist Schedule pessary visit prn

## 2017-03-25 NOTE — Progress Notes (Signed)
Mammogram scheduled for May 3rd @ 0810.  Pt notified.

## 2017-04-08 DIAGNOSIS — H2513 Age-related nuclear cataract, bilateral: Secondary | ICD-10-CM | POA: Diagnosis not present

## 2017-04-08 DIAGNOSIS — H33321 Round hole, right eye: Secondary | ICD-10-CM | POA: Diagnosis not present

## 2017-04-08 DIAGNOSIS — H43813 Vitreous degeneration, bilateral: Secondary | ICD-10-CM | POA: Diagnosis not present

## 2017-04-16 ENCOUNTER — Ambulatory Visit
Admission: RE | Admit: 2017-04-16 | Discharge: 2017-04-16 | Disposition: A | Discharge status: Home / Self Care | Payer: PPO | Source: Ambulatory Visit | Attending: Obstetrics & Gynecology | Admitting: Obstetrics & Gynecology

## 2017-04-16 DIAGNOSIS — Z1231 Encounter for screening mammogram for malignant neoplasm of breast: Secondary | ICD-10-CM

## 2017-04-24 ENCOUNTER — Ambulatory Visit: Payer: PPO | Admitting: Obstetrics & Gynecology

## 2017-08-01 DIAGNOSIS — M7061 Trochanteric bursitis, right hip: Secondary | ICD-10-CM | POA: Diagnosis not present

## 2017-08-13 DIAGNOSIS — R350 Frequency of micturition: Secondary | ICD-10-CM | POA: Diagnosis not present

## 2017-08-13 DIAGNOSIS — R351 Nocturia: Secondary | ICD-10-CM | POA: Diagnosis not present

## 2017-08-19 ENCOUNTER — Encounter: Payer: Self-pay | Admitting: Internal Medicine

## 2017-08-19 ENCOUNTER — Ambulatory Visit: Payer: PPO | Attending: Internal Medicine | Admitting: Internal Medicine

## 2017-08-19 VITALS — BP 119/82 | HR 73 | Temp 98.5°F | Resp 16 | Wt 141.8 lb

## 2017-08-19 DIAGNOSIS — M62838 Other muscle spasm: Secondary | ICD-10-CM | POA: Insufficient documentation

## 2017-08-19 DIAGNOSIS — G373 Acute transverse myelitis in demyelinating disease of central nervous system: Secondary | ICD-10-CM | POA: Diagnosis not present

## 2017-08-19 DIAGNOSIS — Z760 Encounter for issue of repeat prescription: Secondary | ICD-10-CM | POA: Diagnosis not present

## 2017-08-19 DIAGNOSIS — F331 Major depressive disorder, recurrent, moderate: Secondary | ICD-10-CM | POA: Insufficient documentation

## 2017-08-19 DIAGNOSIS — F339 Major depressive disorder, recurrent, unspecified: Secondary | ICD-10-CM | POA: Diagnosis not present

## 2017-08-19 DIAGNOSIS — M1990 Unspecified osteoarthritis, unspecified site: Secondary | ICD-10-CM | POA: Insufficient documentation

## 2017-08-19 DIAGNOSIS — K219 Gastro-esophageal reflux disease without esophagitis: Secondary | ICD-10-CM | POA: Diagnosis not present

## 2017-08-19 DIAGNOSIS — G2581 Restless legs syndrome: Secondary | ICD-10-CM | POA: Insufficient documentation

## 2017-08-19 DIAGNOSIS — Z23 Encounter for immunization: Secondary | ICD-10-CM | POA: Insufficient documentation

## 2017-08-19 DIAGNOSIS — Z79899 Other long term (current) drug therapy: Secondary | ICD-10-CM | POA: Diagnosis not present

## 2017-08-19 MED ORDER — BACLOFEN 20 MG PO TABS: 20.0000 mg | ORAL_TABLET | Freq: Three times a day (TID) | ORAL | 3 refills | Status: DC

## 2017-08-19 NOTE — Patient Instructions (Addendum)
Pneumococcal Conjugate Vaccine (PCV13) What You Need to Know 1. Why get vaccinated? Vaccination can protect both children and adults from pneumococcal disease. Pneumococcal disease is caused by bacteria that can spread from person to person through close contact. It can cause ear infections, and it can also lead to more serious infections of the:  Lungs (pneumonia),  Blood (bacteremia), and  Covering of the brain and spinal cord (meningitis).  Pneumococcal pneumonia is most common among adults. Pneumococcal meningitis can cause deafness and brain damage, and it kills about 1 child in 10 who get it. Anyone can get pneumococcal disease, but children under 2 years of age and adults 65 years and older, people with certain medical conditions, and cigarette smokers are at the highest risk. Before there was a vaccine, the [place] saw:  more than 700 cases of meningitis,  about 13,000 blood infections,  about 5 million ear infections, and  about 200 deaths  in children under 5 each year from pneumococcal disease. Since vaccine became available, severe pneumococcal disease in these children has fallen by 88%. About 18,000 older adults die of pneumococcal disease each year in the [place]. Treatment of pneumococcal infections with penicillin and other drugs is not as effective as it used to be, because some strains of the disease have become resistant to these drugs. This makes prevention of the disease, through vaccination, even more important. 2. PCV13 vaccine Pneumococcal conjugate vaccine (called PCV13) protects against 13 types of pneumococcal bacteria. PCV13 is routinely given to children at 2, 4, 6, and 12-15 months of age. It is also recommended for children and adults 2 to 64 years of age with certain health conditions, and for all adults 65 years of age and older. Your doctor can give you details. 3. Some people should not get this vaccine Anyone who has ever had a  life-threatening allergic reaction to a dose of this vaccine, to an earlier pneumococcal vaccine called PCV7, or to any vaccine containing diphtheria toxoid (for example, DTaP), should not get PCV13. Anyone with a severe allergy to any component of PCV13 should not get the vaccine. Tell your doctor if the person being vaccinated has any severe allergies. If the person scheduled for vaccination is not feeling well, your healthcare provider might decide to reschedule the shot on another day. 4. Risks of a vaccine reaction With any medicine, including vaccines, there is a chance of reactions. These are usually mild and go away on their own, but serious reactions are also possible. Problems reported following PCV13 varied by age and dose in the series. The most common problems reported among children were:  About half became drowsy after the shot, had a temporary loss of appetite, or had redness or tenderness where the shot was given.  About 1 out of 3 had swelling where the shot was given.  About 1 out of 3 had a mild fever, and about 1 in 20 had a fever over 102.2F.  Up to about 8 out of 10 became fussy or irritable.  Adults have reported pain, redness, and swelling where the shot was given; also mild fever, fatigue, headache, chills, or muscle pain. Young children who get PCV13 along with inactivated flu vaccine at the same time may be at increased risk for seizures caused by fever. Ask your doctor for more information. Problems that could happen after any vaccine:  People sometimes faint after a medical procedure, including vaccination. Sitting or lying down for about 15 minutes can help prevent   fainting, and injuries caused by a fall. Tell your doctor if you feel dizzy, or have vision changes or ringing in the ears.  Some older children and adults get severe pain in the shoulder and have difficulty moving the arm where a shot was given. This happens very rarely.  Any medication can cause a  severe allergic reaction. Such reactions from a vaccine are very rare, estimated at about 1 in a million doses, and would happen within a few minutes to a few hours after the vaccination. As with any medicine, there is a very small chance of a vaccine causing a serious injury or death. The safety of vaccines is always being monitored. For more information, visit: http://www.aguilar.org/ 5. What if there is a serious reaction? What should I look for? Look for anything that concerns you, such as signs of a severe allergic reaction, very high fever, or unusual behavior. Signs of a severe allergic reaction can include hives, swelling of the face and throat, difficulty breathing, a fast heartbeat, dizziness, and weakness-usually within a few minutes to a few hours after the vaccination. What should I do?  If you think it is a severe allergic reaction or other emergency that can't wait, call 9-1-1 or get the person to the nearest hospital. Otherwise, call your doctor.  Reactions should be reported to the Vaccine Adverse Event Reporting System (VAERS). Your doctor should file this report, or you can do it yourself through the VAERS web site at www.vaers.SamedayNews.es, or by calling 912 721 4337. ? VAERS does not give medical advice. 6. The National Vaccine Injury Compensation Program The [employer] Vaccine Injury Compensation Program (VICP) is a federal program that was created to compensate people who may have been injured by certain vaccines. Persons who believe they may have been injured by a vaccine can learn about the program and about filing a claim by calling 786-104-4827 or visiting the [place] website at GoldCloset.com.ee. There is a time limit to file a claim for compensation. 7. How can I learn more?  Ask your healthcare provider. He or she can give you the vaccine package insert or suggest other sources of information.  Call your local or state health department.  Contact the  Centers for Disease Control and Prevention (CDC): ? Call 934-670-8113 (1-800-CDC-INFO) or ? Visit CDC's website at http://hunter.com/ Vaccine Information Statement, PCV13 Vaccine (10/19/2014) This information is not intended to replace advice given to you by your health care provider. Make sure you discuss any questions you have with your health care provider. Document Released: 09/28/2006 Document Revised: 08/21/2016 Document Reviewed: 08/21/2016 Elsevier Interactive Patient Education  2017 Elsevier Inc.  Transverse Myelitis Transverse myelitis is a condition that causes inflammation of the spinal cord. The inflammation affects the fatty lining that covers spinal cord nerves (myelin). It can cause scarring of nerves, which can interfere with nerve signals passing to and from the spinal cord. Signs and symptoms of this condition happen at the affected level of the spinal cord and below. The condition most often causes weakness of the arms or legs, pain, changes in feeling (sensation), and bowel and bladder problems. What are the causes? The exact cause of this condition is not known. It sometimes develops after a viral infection, such as herpes, chicken pox, cytomegalovirus, HIV, or Epstein-Barr virus. It can also occur after a bacterial infection or along with diseases that make the body's immune system mistakenly attack healthy tissues (autoimmune diseases). What increases the risk? This condition is more likely to develop in:  People who have an autoimmune disease, especially multiple sclerosis and neuromyelitis optica.  People 28-81 years old.  People 18-8 years old.  What are the signs or symptoms? Symptoms of this condition may start suddenly within hours or develop gradually over weeks. Symptoms include:  Pain, especially in the neck or back, with shooting pains into the legs.  Headache.  Weakness of the arms or the legs.  Abnormal sensations, such as burning, prickling,  numbness, or tingling in the arms or legs.  Increased sensitivity to touch or changes in temperature.  Bowel and bladder problems, including increased need to go, loss of control, and difficulty going.  Difficulty walking, including foot dragging and stumbling.  Fatigue.  Fever.  Loss of appetite.  Paralysis.  Difficulty breathing.  How is this diagnosed? This condition may be diagnosed with a neurological exam. During this exam, your health care provider will ask about your symptoms and do a complete physical exam to check your spinal cord function. You may need to see a nervous system specialist (neurologist) to have tests, which may include:  An MRI to check for inflammation or scarring.  Blood tests to check for infections that can trigger this condition or for neuromyelitis optica.  A lumbar puncture to check your spinal fluid for signs of infection or inflammation. For this procedure, a small amount of the fluid that surrounds the brain and spinal cord is removed and examined.  How is this treated? There is no cure for this condition. You may have treatment to reduce inflammation and control symptoms. Treatment may involve:  Pain medicine.  Corticosteroid medicines to reduce inflammation. These are usually given through an IV needle at first. Later, they may be taken by mouth.  Breathing support with a device called a respirator.  Physical therapy to: ? Reduce the risk of bedsores. ? Improve muscle strength, flexibility, coordination, and range of motion in affected muscles. ? Reduce muscle spasms and muscle wasting in paralyzed arms or legs. ? Improve control over your bladder and bowel.  Occupational therapy. This therapy helps you learn how to care for yourself and do everyday tasks such as bathing and dressing. It cannot reverse problems caused by this condition, but it can help you become as independent as possible.  Follow these instructions at home:  Take  over-the-counter and prescription medicines only as told by your health care provider.  Rest in bed at home as told by your health care provider until you start to recover strength and movement. Ask your health care provider what activities are safe for you.  Perform any exercises as told by your health care provider.  Keep all follow-up visits as told by your health care provider. This is important. Contact a health care provider if:  Your symptoms are not improving or are getting worse.  You have symptoms that come back after going away.  You are having a hard time managing at home. Get help right away if:  You cannot care for yourself at home.  You have trouble breathing. This information is not intended to replace advice given to you by your health care provider. Make sure you discuss any questions you have with your health care provider. Document Released: 11/21/2002 Document Revised: 08/03/2016 Document Reviewed: 05/09/2015 Elsevier Interactive Patient Education  [employer].

## 2017-08-19 NOTE — Progress Notes (Signed)
Gail Gilbert, is a 66 y.o. female  LKG:[id number]  GUY:[id number]  DOB - 1951/03/28  Chief Complaint  Patient presents with  . Medication Refill  . Immunizations       Subjective:   Gail Gilbert is a 66 y.o. female with history of idiopathic transverse myelitis, major depression and anxiety, restless leg syndrome, spondylolisthesis and GERD here today for a follow up visit and immunization therapy. Patient reports doing well and sleeping well. Patient takes Trazodone nightly for sleep regimen. Reports no complication with falling asleep; however, after waking each night to use the restroom, she has difficulty falling back to sleep and takes Trazodone. Patient declines to take her prescription for Sertraline. Reports anxiety intermittently that is controlled through deep breathing, prayer, or sighting of bible verses. Patient does not want to take Sertraline for fear of potential side effects. Denies SI/HI at this time. Patient declines taking Omeprazole; denies heartburn, nausea, or vomiting. Patient reports consistent muscles spasm in her calf and feet, bilaterally every morning after stretching in bed. Pain last for a duration of "couple minutes"; relieved by ambulation and Ibuprofen x 400 mg daily. Denies numbness, tingling, weakness, or recent falls related to the lower extremity disturbance. Patient believes the spasms are related to her Idiopathic Transverse Myelitis and requested Baclofen for treatment. Patient has No headache, No chest pain, No abdominal pain - No Nausea, No Cough - SOB.  Problem  Moderate Episode of Recurrent Major Depressive Disorder (Hcc)  Need for 23-Polyvalent Pneumococcal Polysaccharide Vaccine  Muscle Spasm    ALLERGIES: Allergies  Allergen Reactions  . Antihistamines, Diphenhydramine-Type Palpitations    PAST MEDICAL HISTORY: Past Medical History:  Diagnosis Date  . Cataract, immature   . Dental crowns present   . Hammer toe of right foot 07/2016   2nd and 3rd toes  . IBS (irritable bowel syndrome)    no current problems  . Idiopathic transverse myelitis ([place]) 2014   residual numbness feet and right small finger  . Osteoarthritis    cervical spine, lumbar spine  . RLS (restless legs syndrome)    no current med.    MEDICATIONS AT HOME: Prior to Admission medications   Medication Sig Start Date End Date Taking? Authorizing Provider  baclofen (LIORESAL) 20 MG tablet Take 1 tablet (20 mg total) by mouth 3 (three) times daily. 08/19/17   Tresa Garter, MD  omeprazole (PRILOSEC) 40 MG capsule Take 1 capsule (40 mg total) by mouth daily. Patient not taking: Reported on 03/25/2017 01/14/17   Tresa Garter, MD  sertraline (ZOLOFT) 25 MG tablet Take 1 tablet (25 mg total) by mouth daily. Patient not taking: Reported on 03/25/2017 08/28/16   Tresa Garter, MD  traZODone (DESYREL) 100 MG tablet Take 0.5 tablets (50 mg total) by mouth at bedtime. 12/24/16   Tresa Garter, MD    Objective:   Vitals:   08/19/17 1027  BP: 119/82  Pulse: 73  Resp: 16  Temp: 98.5 F (36.9 C)  TempSrc: Oral  SpO2: 98%  Weight: 141 lb 12.8 oz (64.3 kg)   Exam General appearance : Awake, alert, not in any distress. Speech Clear. Not toxic looking HEENT: Atraumatic and Normocephalic, pupils equally reactive to light and accomodation Neck: Supple, no JVD. No cervical lymphadenopathy.  Chest: Good air entry bilaterally, no added sounds  CVS: S1 S2 regular, no murmurs.  Abdomen: Bowel sounds present, Non tender and not distended with no gaurding, rigidity or rebound. Extremities: B/L Lower Ext shows no  edema, both legs are warm to touch Neurology: Awake alert, and oriented X 3, CN II-XII intact, Non focal Skin: No Rash  Data Review Lab Results  Component Value Date   HGBA1C 5.40 09/17/2015    Assessment & Plan   1. Need for 23-polyvalent pneumococcal polysaccharide vaccine  - Pneumococcal conjugate vaccine 13-valent  2.  Moderate episode of recurrent major depressive disorder (HCC)  - Hepatitis c antibody (reflex)  3. Idiopathic transverse myelitis (HCC)  - baclofen (LIORESAL) 20 MG tablet; Take 1 tablet (20 mg total) by mouth 3 (three) times daily.  Dispense: 90 each; Refill: 3  4. Muscle spasm  - baclofen (LIORESAL) 20 MG tablet; Take 1 tablet (20 mg total) by mouth 3 (three) times daily.  Dispense: 90 each; Refill: 3  Patient have been counseled extensively about nutrition and exercise. Other issues discussed during this visit include: low cholesterol diet, weight control and daily exercise  Return in about 4 months (around 12/19/2017) for Annual Physical, Tetanus Shot.  The patient was given clear instructions to go to ER or return to medical center if symptoms don't improve, worsen or new problems develop. The patient verbalized understanding. The patient was told to call to get lab results if they haven't heard anything in the next week.   This note has been created with Surveyor, quantity. Any transcriptional errors are unintentional.    Angelica Chessman, MD, [place], Karilyn Cota, [place] and [facility] [place], [place]   08/19/2017, 11:09 AM

## 2017-08-20 LAB — HCV COMMENT:

## 2017-08-20 LAB — HEPATITIS C ANTIBODY (REFLEX): HCV AB: 0.1 {s_co_ratio} (ref 0.0–0.9)

## 2017-08-21 ENCOUNTER — Telehealth (INDEPENDENT_AMBULATORY_CARE_PROVIDER_SITE_OTHER): Payer: Self-pay | Admitting: *Deleted

## 2017-08-21 NOTE — Telephone Encounter (Signed)
Patient verified DOB Patient hepatitis is negative. No further questions.

## 2017-08-21 NOTE — Telephone Encounter (Signed)
-----   Message from Tresa Garter, MD sent at 08/21/2017 10:20 AM EDT ----- Hepatitis C Negative

## 2017-09-04 DIAGNOSIS — H2513 Age-related nuclear cataract, bilateral: Secondary | ICD-10-CM | POA: Diagnosis not present

## 2017-09-04 DIAGNOSIS — H33321 Round hole, right eye: Secondary | ICD-10-CM | POA: Diagnosis not present

## 2017-09-04 DIAGNOSIS — H43813 Vitreous degeneration, bilateral: Secondary | ICD-10-CM | POA: Diagnosis not present

## 2017-09-14 DIAGNOSIS — M7061 Trochanteric bursitis, right hip: Secondary | ICD-10-CM | POA: Diagnosis not present

## 2017-10-23 ENCOUNTER — Other Ambulatory Visit: Payer: Self-pay | Admitting: Internal Medicine

## 2017-10-23 DIAGNOSIS — F32A Depression, unspecified: Secondary | ICD-10-CM

## 2017-10-23 DIAGNOSIS — F329 Major depressive disorder, single episode, unspecified: Secondary | ICD-10-CM

## 2017-11-18 IMAGING — MR MR LUMBAR SPINE W/O CM
4 of 5 series · 17 of 48 positions shown · non-contrast
Comparison: 02/25/2013.

CLINICAL DATA: 65-year-old female with history of transverse
myelitis. Lumbar laminectomy with cyst removal. Lower back pain
extending to right hip and leg. Bilateral foot numbness. Symptoms
for the past month. Initial encounter.

EXAM:
MRI LUMBAR SPINE WITHOUT CONTRAST
TECHNIQUE: Multiplanar, multisequence MR imaging of the lumbar spine was
performed. No intravenous contrast was administered.

[Series 3: T2 · sagittal · 4.0mm · 0.55mm/px · 6 of 15 slices shown (1 of 2)]
[im 1/15]
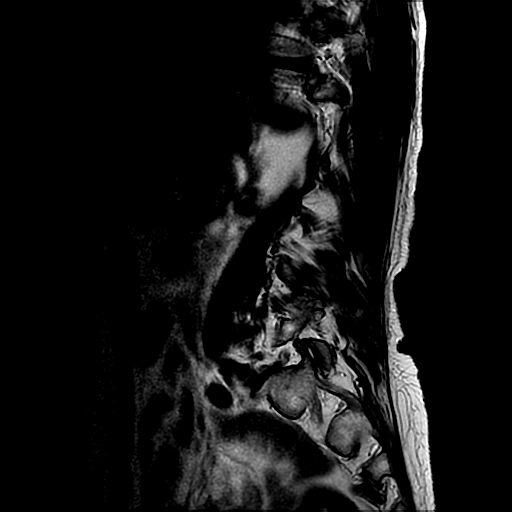
[im 3/15]
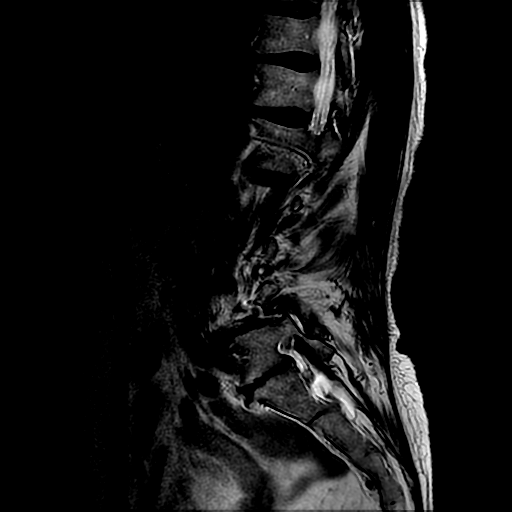
[im 6/15]
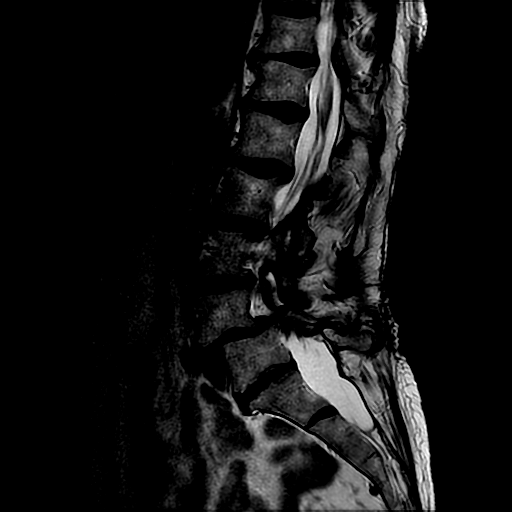
[im 9/15]
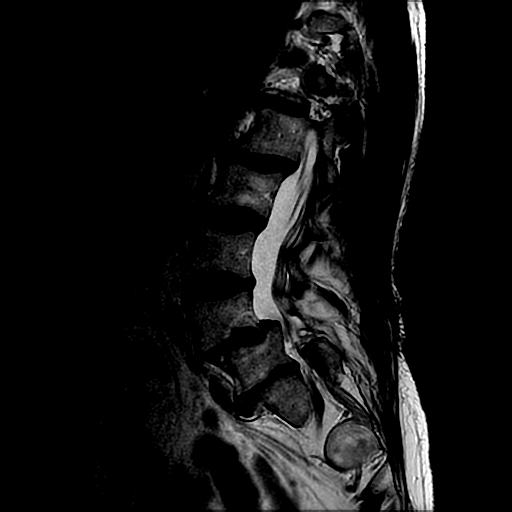
[im 12/15]
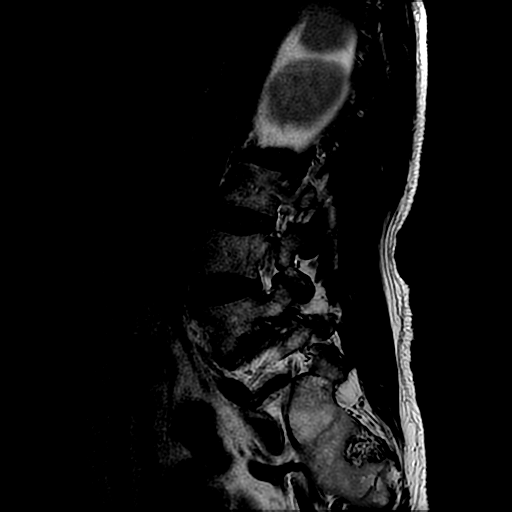
[im 15/15]
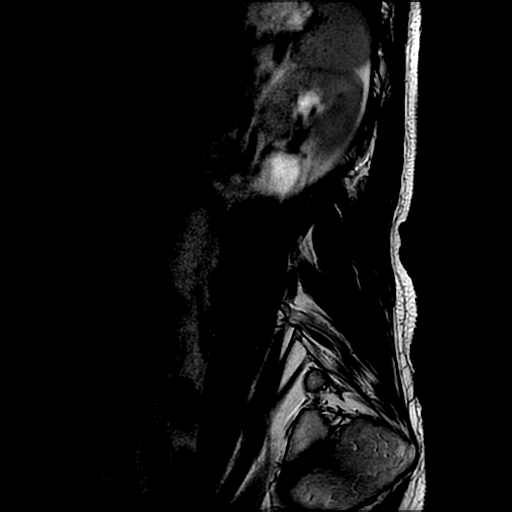

[Series 4: T1 · sagittal · 4.0mm · 0.55mm/px · 3 of 15 slices shown (1 of 2)]
[im 3/15]
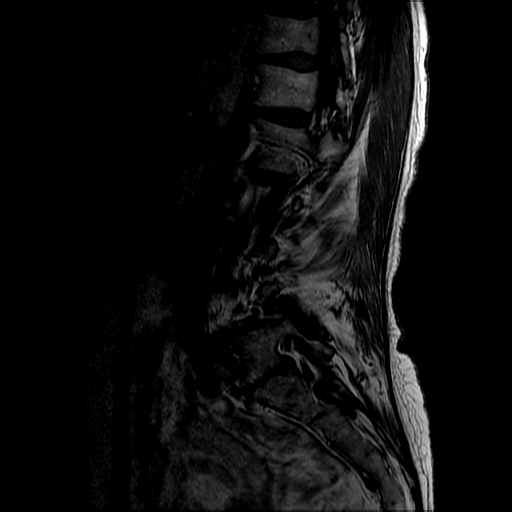
[im 9/15]
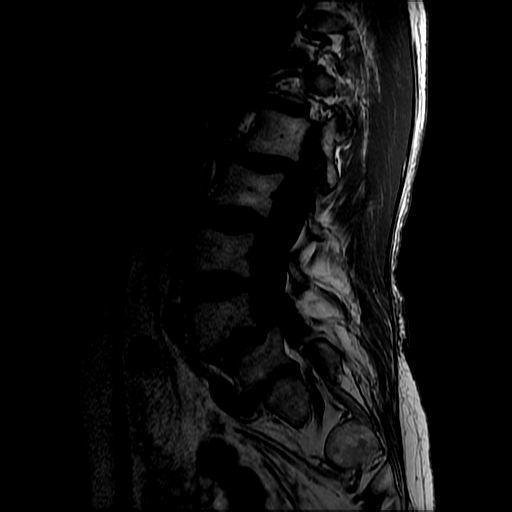
[im 15/15]
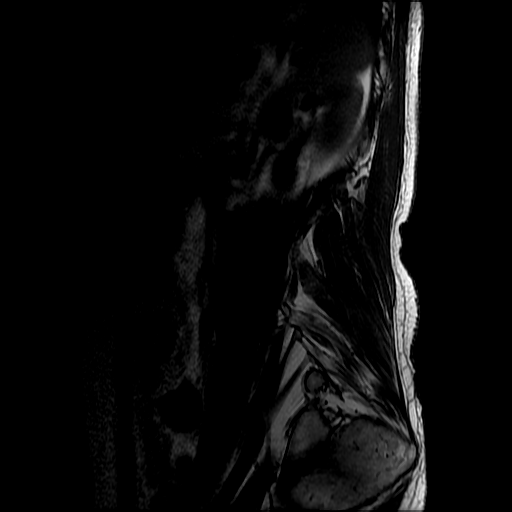

[Series 6: T1 · axial · 4.0mm · 0.39mm/px · z∈[-30,+118]mm · 3 of 35 slices shown (2 of 2)]
[im 5/35]
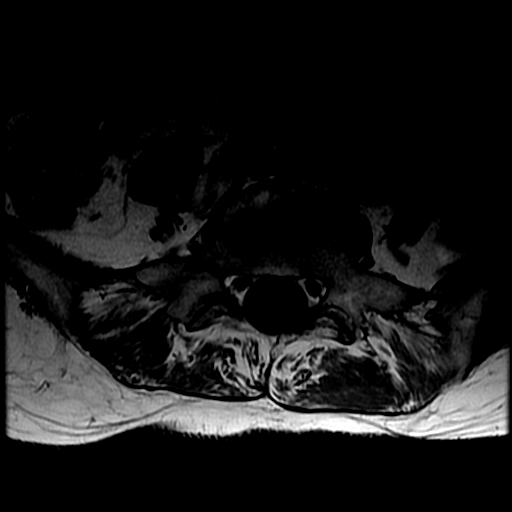
[im 18/35]
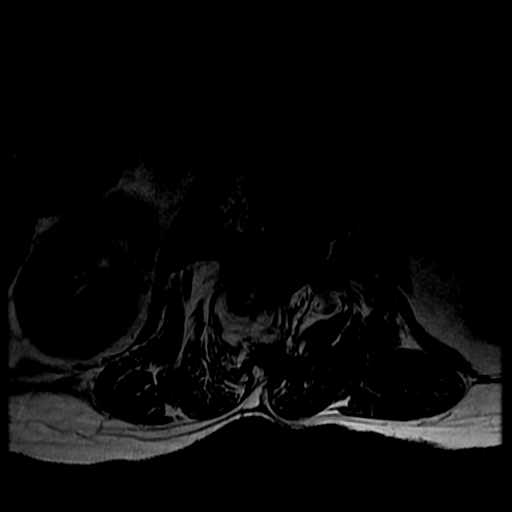
[im 30/35]
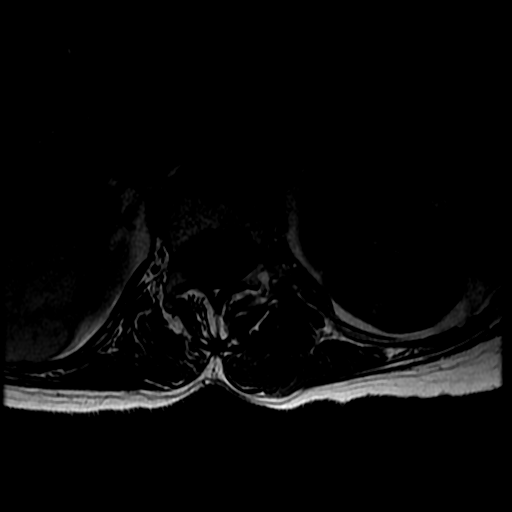

[Series 7: T2 · axial · 4.0mm · 0.39mm/px · z∈[-50,+118]mm · 5 of 35 slices shown (2 of 2)]
[im 1/35]
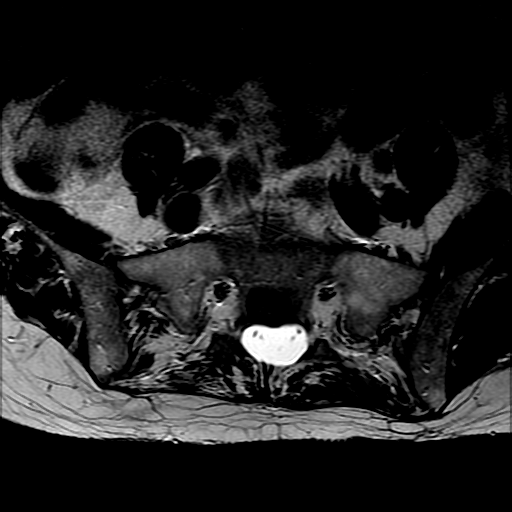
[im 5/35]
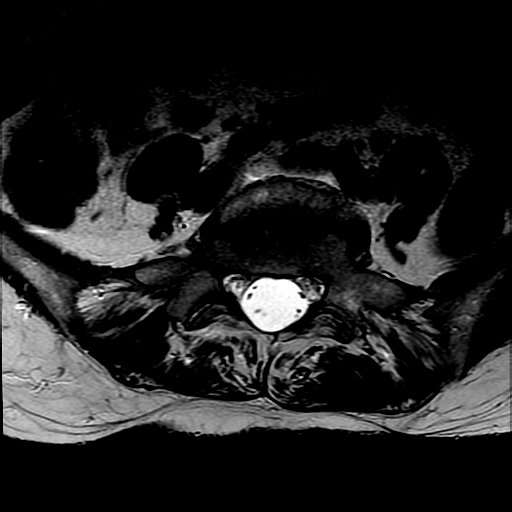
[im 10/35]
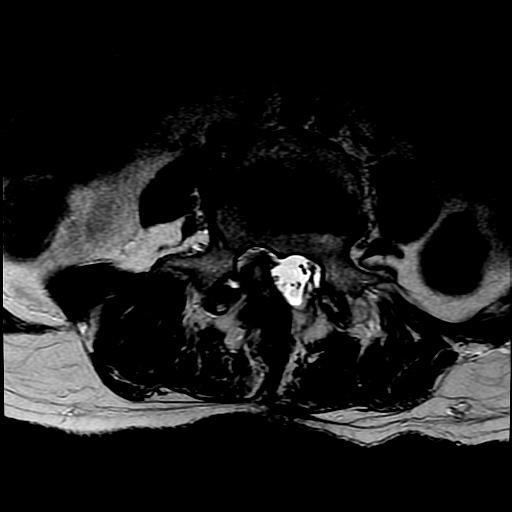
[im 18/35]
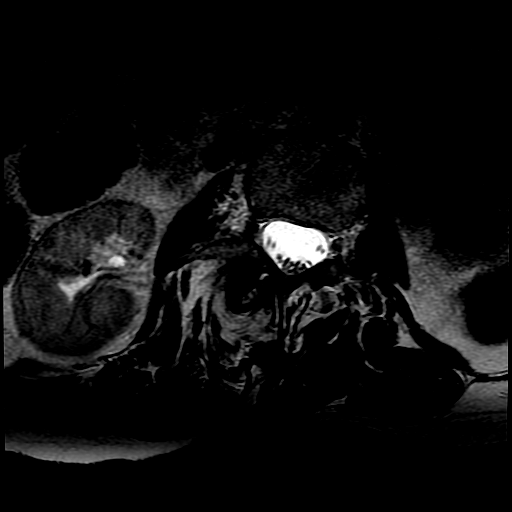
[im 30/35]
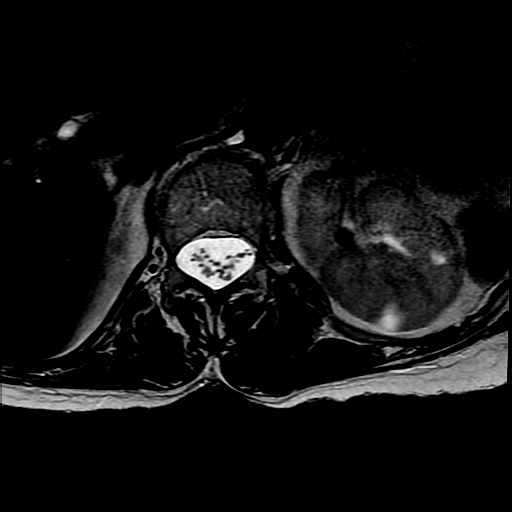

[17 of 48 positions shown; findings below may reference images not displayed]

FINDINGS: Last fully open disk space is labeled L5-S1. Present examination
incorporates from T10-11 disc space through lower sacrum.

Conus just below the T12-L1 disc space level. No abnormal signal
within the distal cord conus. Without contrast administration, one
cannot evaluate for the possibility of conus or nerve root
enhancement.

T2 bright small renal lesions some of which were noted previously
may represent small cysts but incompletely assessed.

Progressive scoliosis with superimposed degenerative changes.

T11, T12 and L4 vertebral body lesions may represent small
hemangiomas and without change.

T10-11:  Mild facet degenerative changes bilaterally.

T11-12: Facet degenerative changes greater on the left. Minimal
anterior slip T11. Disc space narrowing greatest anteriorly. Bulge
greater to left with associated osteophyte. Mild left foraminal
narrowing. Mild narrowing ventral thecal sac.

T12-L1: Minimal Schmorl's node deformity superior endplate L1. Mild
bulge and rotation. Facet degenerative changes. Very mild thecal sac
narrowing. Minimal foraminal narrowing greater on the left.

L1-2: Facet degenerative changes greater on the right.
Retrolisthesis L1 with bulge having slightly caudal extension
greater on the right. Mild narrowing lateral aspect of the thecal
sac greater on the right. Mild bilateral foraminal narrowing.

L2-3: Facet degenerative changes greater on the right. Mild
retrolisthesis and rotation L2. Bulge. Narrowing right lateral
aspect of the thecal sac crowding the contained right L3 nerve root.
Mild right foraminal narrowing.

L3-4: Marked right-sided and moderate left-sided facet degenerative
changes and bony overgrowth with impression upon the right lateral/
posterior lateral aspect of the thecal sac. Fluid within the facet
joints greater on the right undermining ligamentum flavum
hypertrophy. Mild right foraminal narrowing.

L4-5: Prominent facet degenerative changes. Sclerotic pars region
without clear pars defect. 1.2 cm anterior slip of L4. Moderate L4-5
disc degeneration with Schmorl's node deformity endplate reactive
changes greater on the left. Rotation and bulge. Multifactorial
moderate to marked left-sided and moderate right-sided foraminal
narrowing. Multifactorial marked bilateral lateral recess stenosis.
Fluid within right facet joint. Prior left hemilaminectomy and
partial left facetectomy. Small fluid collection adjacent to
residual left facet joint with slight flattening of the left lateral
aspect of the thecal sac without significant spinal stenosis.

L5-S1: Mild to moderate facet degenerative changes. Thin pars
bilaterally without pars defect. Bulge and osteophyte with mild
foraminal narrowing greater on the left. No significant spinal
stenosis.
IMPRESSION: Progressive scoliosis. Progression of multilevel superimposed
degenerative changes most prominent L4-5 level followed by the L3-4
level as detailed above.

## 2017-11-27 IMAGING — CR DG LUMBAR SPINE 2-3V
3 series · 3 of 3 positions shown · non-contrast
Comparison: None

Correlation MRI lumbar spine 04/07/2016

CLINICAL DATA: Intermittent low back and RIGHT hip pain for a few
months, no injury, requested lateral flexion and extension views

EXAM:
LUMBAR SPINE - 2-3 VIEW

[view not recorded (1 of 3)]
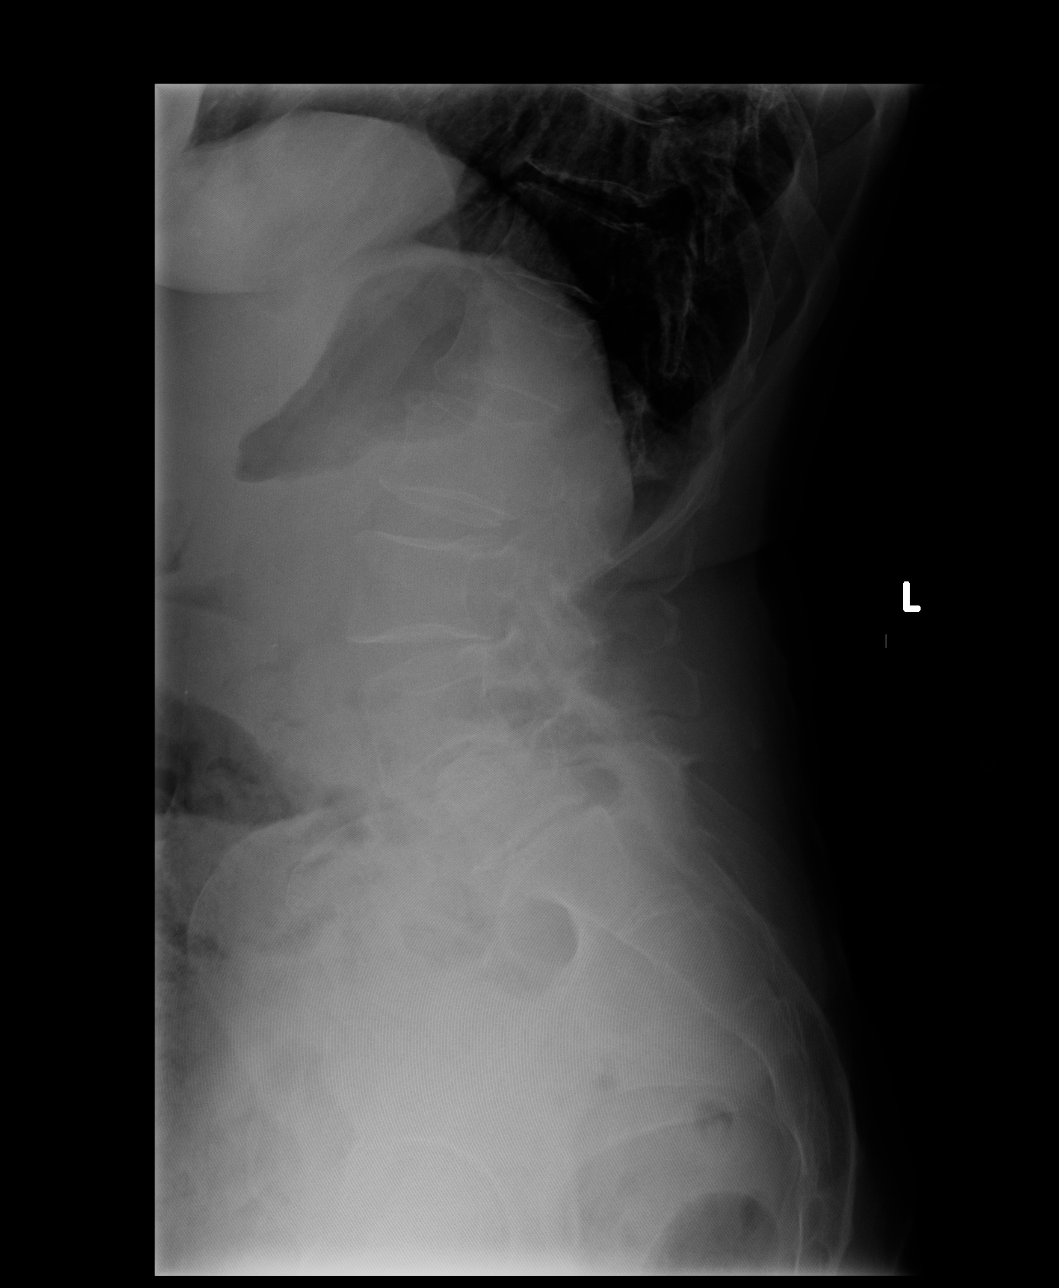

[view not recorded (2 of 3)]
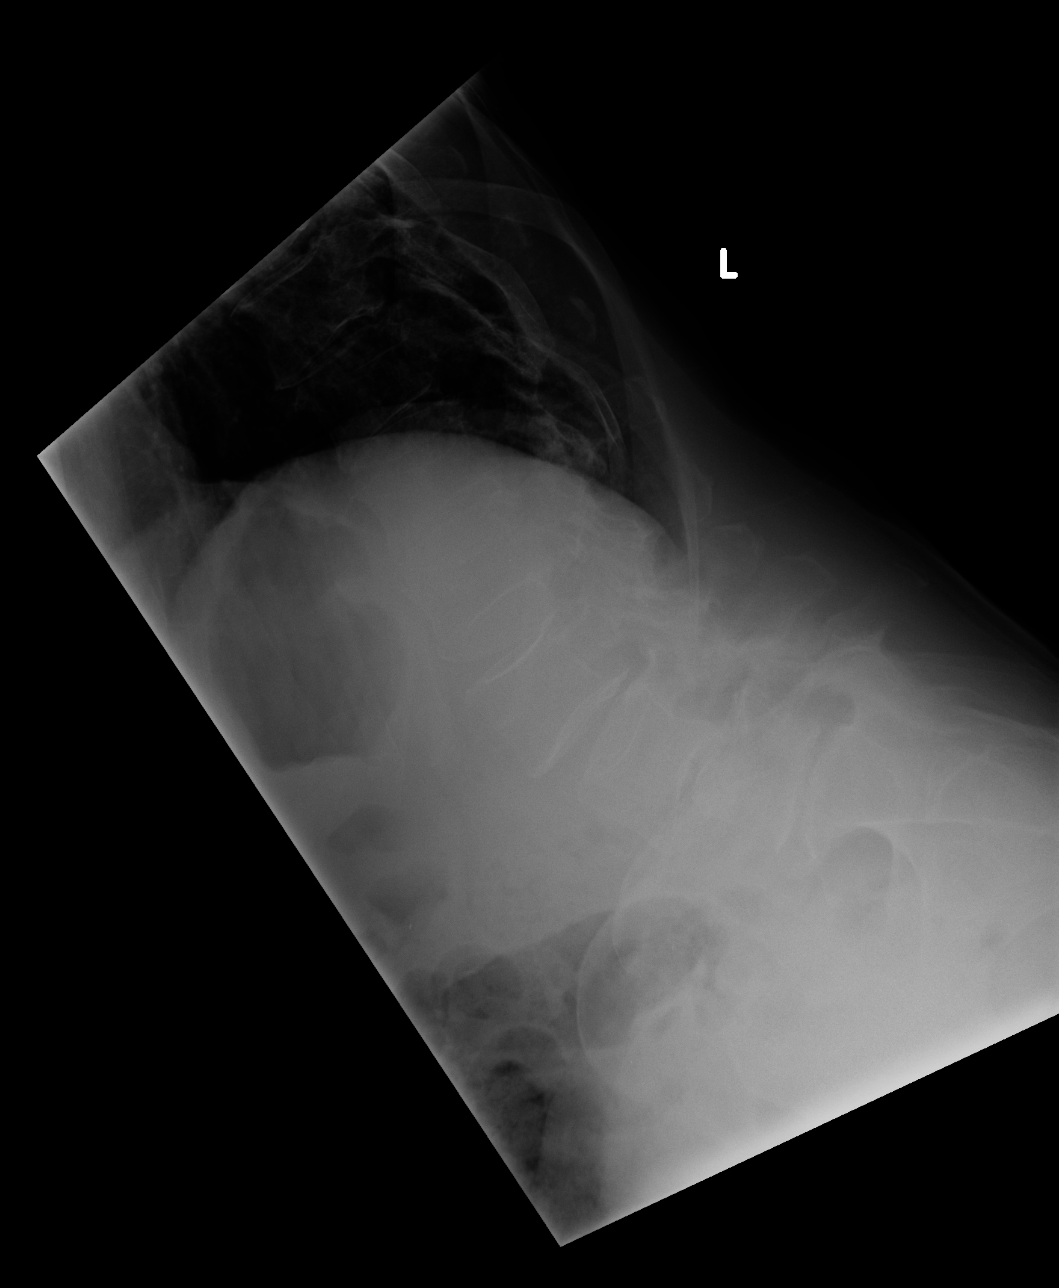

[view not recorded (3 of 3)]
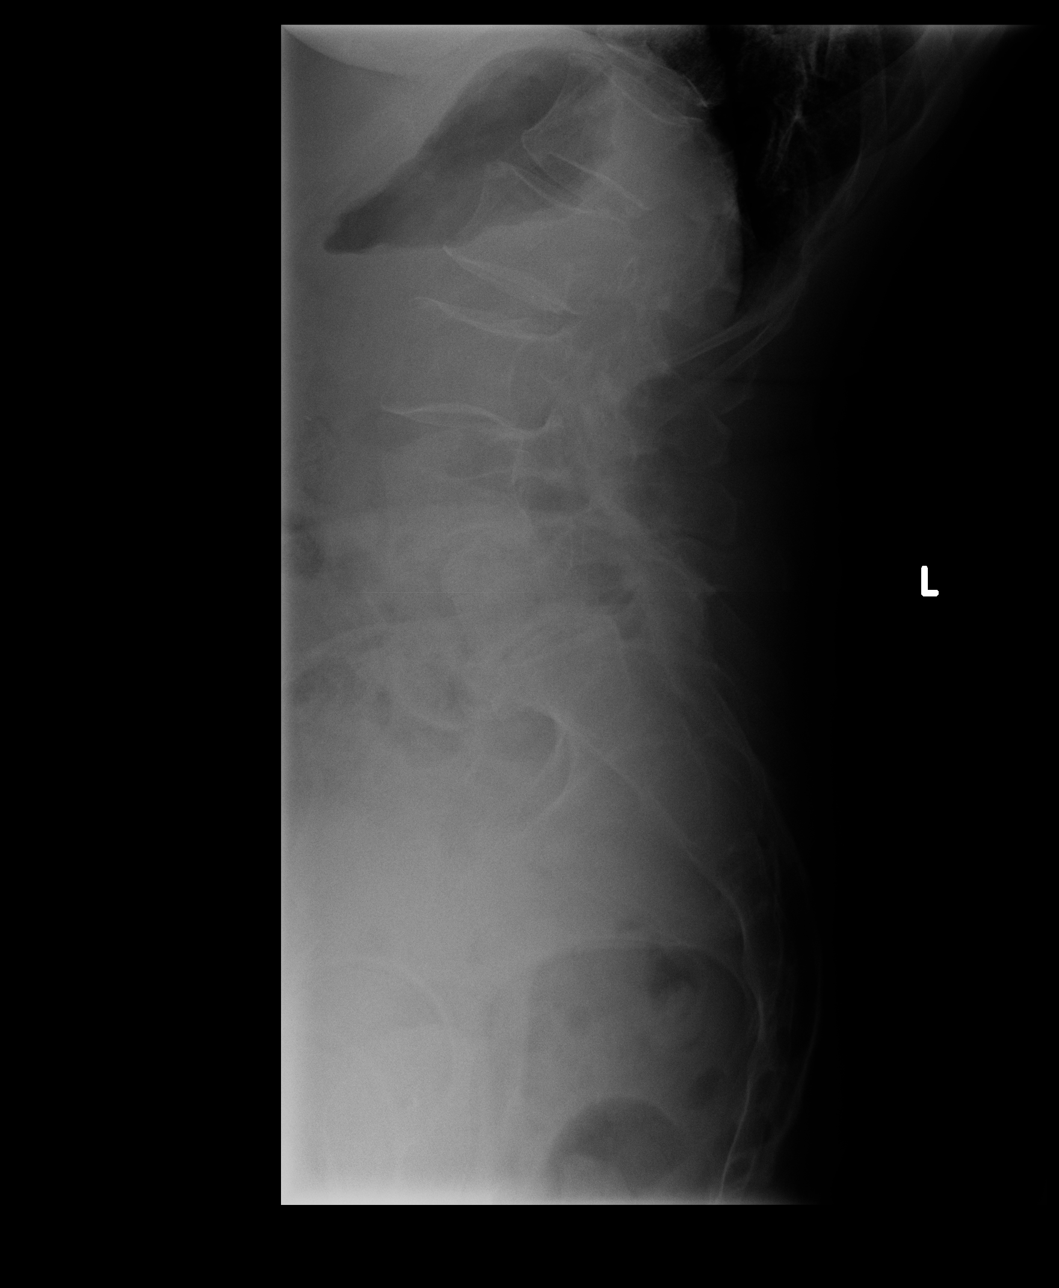

[3 of 3 positions shown; findings below may reference images not displayed]

FINDINGS: Diffuse osseous demineralization.

Prior MRI labeled with 5 lumbar vertebra, current exam labeled
accordingly.

13 mm of anterolisthesis L4-L5 at neutral position.

This minimally increases to 14 mm with flexion and decreases to 11
mm with extension.

Minimal retrolisthesis at L2-L3 which does not appear to
significantly change with flexion or extension.

Facet degenerative changes lower lumbar spine.
IMPRESSION: Anterolisthesis L4-L5 which minimally increases with flexion and
decreases with extension.

Osseous demineralization with scattered degenerative disc and facet
disease changes and minimal retrolisthesis L2-L3.

## 2017-12-18 ENCOUNTER — Other Ambulatory Visit: Payer: Self-pay | Admitting: Family Medicine

## 2017-12-18 ENCOUNTER — Other Ambulatory Visit: Payer: Self-pay | Admitting: Internal Medicine

## 2017-12-18 DIAGNOSIS — G373 Acute transverse myelitis in demyelinating disease of central nervous system: Secondary | ICD-10-CM

## 2017-12-18 DIAGNOSIS — M62838 Other muscle spasm: Secondary | ICD-10-CM

## 2018-01-15 ENCOUNTER — Other Ambulatory Visit: Payer: Self-pay | Admitting: Family Medicine

## 2018-01-15 DIAGNOSIS — M62838 Other muscle spasm: Secondary | ICD-10-CM

## 2018-01-15 DIAGNOSIS — G373 Acute transverse myelitis in demyelinating disease of central nervous system: Secondary | ICD-10-CM

## 2018-02-12 ENCOUNTER — Other Ambulatory Visit: Payer: Self-pay | Admitting: Internal Medicine

## 2018-02-12 DIAGNOSIS — F331 Major depressive disorder, recurrent, moderate: Secondary | ICD-10-CM

## 2018-02-16 ENCOUNTER — Other Ambulatory Visit: Payer: Self-pay | Admitting: Internal Medicine

## 2018-02-16 DIAGNOSIS — M62838 Other muscle spasm: Secondary | ICD-10-CM

## 2018-02-16 DIAGNOSIS — G373 Acute transverse myelitis in demyelinating disease of central nervous system: Secondary | ICD-10-CM

## 2018-02-19 ENCOUNTER — Other Ambulatory Visit: Payer: Self-pay | Admitting: Internal Medicine

## 2018-02-19 DIAGNOSIS — F331 Major depressive disorder, recurrent, moderate: Secondary | ICD-10-CM

## 2018-03-16 ENCOUNTER — Encounter: Payer: Self-pay | Admitting: Family Medicine

## 2018-03-16 ENCOUNTER — Ambulatory Visit: Payer: PPO | Attending: Family Medicine | Admitting: Family Medicine

## 2018-03-16 VITALS — BP 102/65 | HR 76 | Temp 97.9°F | Wt 144.2 lb

## 2018-03-16 DIAGNOSIS — K449 Diaphragmatic hernia without obstruction or gangrene: Secondary | ICD-10-CM | POA: Insufficient documentation

## 2018-03-16 DIAGNOSIS — F329 Major depressive disorder, single episode, unspecified: Secondary | ICD-10-CM | POA: Diagnosis not present

## 2018-03-16 DIAGNOSIS — Z79899 Other long term (current) drug therapy: Secondary | ICD-10-CM | POA: Insufficient documentation

## 2018-03-16 DIAGNOSIS — Z9889 Other specified postprocedural states: Secondary | ICD-10-CM | POA: Insufficient documentation

## 2018-03-16 DIAGNOSIS — Z13228 Encounter for screening for other metabolic disorders: Secondary | ICD-10-CM | POA: Diagnosis not present

## 2018-03-16 DIAGNOSIS — M47812 Spondylosis without myelopathy or radiculopathy, cervical region: Secondary | ICD-10-CM | POA: Diagnosis not present

## 2018-03-16 DIAGNOSIS — F419 Anxiety disorder, unspecified: Secondary | ICD-10-CM | POA: Diagnosis not present

## 2018-03-16 DIAGNOSIS — G373 Acute transverse myelitis in demyelinating disease of central nervous system: Secondary | ICD-10-CM

## 2018-03-16 MED ORDER — TRAZODONE HCL 50 MG PO TABS: 50.0000 mg | ORAL_TABLET | Freq: Every day | ORAL | 1 refills | Status: DC

## 2018-03-16 MED ORDER — OMEPRAZOLE 40 MG PO CPDR: 40.0000 mg | DELAYED_RELEASE_CAPSULE | Freq: Every day | ORAL | 1 refills | Status: DC

## 2018-03-16 MED ORDER — SERTRALINE HCL 25 MG PO TABS: 25.0000 mg | ORAL_TABLET | Freq: Every day | ORAL | 1 refills | Status: DC

## 2018-03-16 NOTE — Patient Instructions (Signed)
Hiatal Hernia  A hiatal hernia occurs when part of the stomach slides above the muscle that separates the abdomen from the chest (diaphragm). A person can be born with a hiatal hernia (congenital), or it may develop over time. In almost all cases of hiatal hernia, only the top part of the stomach pushes through the diaphragm.  Many people have a hiatal hernia with no symptoms. The larger the hernia, the more likely it is that you will have symptoms. In some cases, a hiatal hernia allows stomach acid to flow back into the tube that carries food from your mouth to your stomach (esophagus). This may cause heartburn symptoms. Severe heartburn symptoms may mean that you have developed a condition called gastroesophageal reflux disease (GERD).  What are the causes?  This condition is caused by a weakness in the opening (hiatus) where the esophagus passes through the diaphragm to attach to the upper part of the stomach. A person may be born with a weakness in the hiatus, or a weakness can develop over time.  What increases the risk?  This condition is more likely to develop in:  · Older people. Age is a major risk factor for a hiatal hernia, especially if you are over the age of 50.  · Pregnant women.  · People who are overweight.  · People who have frequent constipation.    What are the signs or symptoms?  Symptoms of this condition usually develop in the form of GERD symptoms. Symptoms include:  · Heartburn.  · Belching.  · Indigestion.  · Trouble swallowing.  · Coughing or wheezing.  · Sore throat.  · Hoarseness.  · Chest pain.  · Nausea and vomiting.    How is this diagnosed?  This condition may be diagnosed during testing for GERD. Tests that may be done include:  · X-rays of your stomach or chest.  · An upper gastrointestinal (GI) series. This is an X-ray exam of your GI tract that is taken after you swallow a chalky liquid that shows up clearly on the X-ray.  · Endoscopy. This is a procedure to look into your  stomach using a thin, flexible tube that has a tiny camera and light on the end of it.    How is this treated?  This condition may be treated by:  · Dietary and lifestyle changes to help reduce GERD symptoms.  · Medicines. These may include:  ? Over-the-counter antacids.  ? Medicines that make your stomach empty more quickly.  ? Medicines that block the production of stomach acid (H2 blockers).  ? Stronger medicines to reduce stomach acid (proton pump inhibitors).  · Surgery to repair the hernia, if other treatments are not helping.    If you have no symptoms, you may not need treatment.  Follow these instructions at home:  Lifestyle and activity  · Do not use any products that contain nicotine or tobacco, such as cigarettes and e-cigarettes. If you need help quitting, ask your health care provider.  · Try to achieve and maintain a healthy body weight.  · Avoid putting pressure on your abdomen. Anything that puts pressure on your abdomen increases the amount of acid that may be pushed up into your esophagus.  ? Avoid bending over, especially after eating.  ? Raise the head of your bed by putting blocks under the legs. This keeps your head and esophagus higher than your stomach.  ? Do not wear tight clothing around your chest or stomach.  ?   Try not to strain when having a bowel movement, when urinating, or when lifting heavy objects.  Eating and drinking  · Avoid foods that can worsen GERD symptoms. These may include:  ? Fatty foods, like fried foods.  ? Citrus fruits, like oranges or lemon.  ? Other foods and drinks that contain acid, like orange juice or tomatoes.  ? Spicy food.  ? Chocolate.  · Eat frequent small meals instead of three large meals a day. This helps prevent your stomach from getting too full.  ? Eat slowly.  ? Do not lie down right after eating.  ? Do not eat 1-2 hours before bed.  · Do not drink beverages with caffeine. These include cola, coffee, cocoa, and tea.  · Do not drink alcohol.  General  instructions  · Take over-the-counter and prescription medicines only as told by your health care provider.  · Keep all follow-up visits as told by your health care provider. This is important.  Contact a health care provider if:  · Your symptoms are not controlled with medicines or lifestyle changes.  · You are having trouble swallowing.  · You have coughing or wheezing that will not go away.  Get help right away if:  · Your pain is getting worse.  · Your pain spreads to your arms, neck, jaw, teeth, or back.  · You have shortness of breath.  · You sweat for no reason.  · You feel sick to your stomach (nauseous) or you vomit.  · You vomit blood.  · You have bright red blood in your stools.  · You have black, tarry stools.  This information is not intended to replace advice given to you by your health care provider. Make sure you discuss any questions you have with your health care provider.  Document Released: 02/21/2004 Document Revised: 11/24/2016 Document Reviewed: 11/24/2016  Elsevier Interactive Patient Education © 2018 [employer] Inc.

## 2018-03-16 NOTE — Progress Notes (Signed)
Subjective:  Patient ID: Gail Gilbert, female    DOB: 1951/03/13  Age: 67 y.o. MRN: [id number]  CC: Establish Care   HPI [name] is a 67 year old female with a history of anxiety and depression, idiopathic transverse myelitis previously followed by Dr. Doreene Burke who presents today to establish care with me. She has been out of her Zoloft and is requesting a refill but has been compliant with trazodone.  Denies suicidal ideation or intents but sometimes gets anxious. With regards to her transverse myelitis she was diagnosed in 2014 at which time she developed sudden paraparesis with inability to walk and was hospitalized for 3 weeks during which time she received physical therapy and resumed walking again.  Her gait is unstable and is more pronounced on uneven ground; she does experience numbness in both lower extremities which she describes as a band around both legs starting from her knees downwards and intermittent muscle spasms which she states are not bothersome and she no longer needs the baclofen as symptoms are tolerable; previously had sedation with baclofen and had to take it at night but now she thinks she is doing well off it.  She complains of dyspepsia and is requesting a GI referral for possible endoscopy as she has had esophageal dilatation in the past.  Review of her chart indicates a diagnosis of slight hiatal hernia from EGD of 2009 by [facility].  She was prescribed Prilosec which she has not been taking.  Past Medical History:  Diagnosis Date  . Cataract, immature   . Dental crowns present   . Hammer toe of right foot 07/2016   2nd and 3rd toes  . IBS (irritable bowel syndrome)    no current problems  . Idiopathic transverse myelitis ([place]) 2014   residual numbness feet and right small finger  . Osteoarthritis    cervical spine, lumbar spine  . RLS (restless legs syndrome)    no current med.    Past Surgical History:  Procedure Laterality Date  .  BUNIONECTOMY Bilateral   . COLONOSCOPY WITH PROPOFOL  11/26/2015  . HAMMER TOE SURGERY  10/29/2011   Procedure: HAMMER TOE CORRECTION;  Surgeon: Kerin Salen;  Location: [facility];  Service: Orthopedics;  Laterality: Left;  Left 2nd toe duvries arthroplasty  . HAMMER TOE SURGERY Right 07/23/2016   Procedure: [place];  Surgeon: Frederik Pear, MD;  Location: [facility];  Service: Orthopedics;  Laterality: Right;  . MIDDLE EAR SURGERY     to relieve ringing in ear  . SYNOVIAL CYST EXCISION     lumbar back    Allergies  Allergen Reactions  . Antihistamines, Diphenhydramine-Type Palpitations     Outpatient Medications Prior to Visit  Medication Sig Dispense Refill  . sertraline (ZOLOFT) 25 MG tablet TAKE 1 TABLET(25 MG) BY MOUTH DAILY 90 tablet 0  . traZODone (DESYREL) 100 MG tablet TAKE 1/2 TABLET BY MOUTH AT BEDTIME 15 tablet 0  . baclofen (LIORESAL) 20 MG tablet TAKE 1 TABLET BY MOUTH THREE TIMES DAILY (Patient not taking: Reported on 03/16/2018) 90 tablet 0  . omeprazole (PRILOSEC) 40 MG capsule Take 1 capsule (40 mg total) by mouth daily. (Patient not taking: Reported on 03/25/2017) 90 capsule 3   No facility-administered medications prior to visit.     ROS Review of Systems  Constitutional: Negative for activity change, appetite change and fatigue.  HENT: Negative for congestion, sinus pressure and sore throat.  Eyes: Negative for visual disturbance.  Respiratory: Negative for cough, chest tightness, shortness of breath and wheezing.   Cardiovascular: Negative for chest pain and palpitations.  Gastrointestinal: Negative for abdominal distention, abdominal pain and constipation.  Endocrine: Negative for polydipsia.  Genitourinary: Negative for dysuria and frequency.  Musculoskeletal: Negative for arthralgias and back pain.  Skin: Negative for rash.  Neurological: Negative for tremors, light-headedness and numbness.    Hematological: Does not bruise/bleed easily.  Psychiatric/Behavioral: Negative for agitation and behavioral problems.    Objective:  BP 102/65   Pulse 76   Temp 97.9 F (36.6 C) (Oral)   Wt 144 lb 3.2 oz (65.4 kg)   SpO2 97%   BMI 21.93 kg/m   BP/Weight 03/16/2018 08/19/2017 1/85/6314  Systolic BP 970 263 785  Diastolic BP 65 82 65  Wt. (Lbs) 144.2 141.8 143.3  BMI 21.93 21.56 21.79      Physical Exam  Constitutional: She is oriented to person, place, and time. She appears well-developed and well-nourished.  Cardiovascular: Normal rate, normal heart sounds and intact distal pulses.  No murmur heard. Pulmonary/Chest: Effort normal and breath sounds normal. She has no wheezes. She has no rales. She exhibits no tenderness.  Abdominal: Soft. Bowel sounds are normal. She exhibits no distension and no mass. There is no tenderness.  Musculoskeletal: Normal range of motion.  Neurological: She is alert and oriented to person, place, and time.  Reflex Scores:      Patellar reflexes are 3+ on the right side and 2+ on the left side. Normal motor strength bilateral  Skin: Skin is warm and dry.  Psychiatric: She has a normal mood and affect.     Assessment & Plan:   1. Anxiety and depression Uncontrolled due to running out of sertraline which I have resumed - traZODone (DESYREL) 50 MG tablet; Take 1 tablet (50 mg total) by mouth at bedtime.  Dispense: 90 tablet; Refill: 1 - sertraline (ZOLOFT) 25 MG tablet; Take 1 tablet (25 mg total) by mouth daily.  Dispense: 90 tablet; Refill: 1  2. Idiopathic transverse myelitis ([place]) Fall precautions Declines initiation of gabapentin for paresthesia Would like baclofen discontinued  3. Hiatal hernia Previous history of esophageal dilatation Advised to resume Prilosec - omeprazole (PRILOSEC) 40 MG capsule; Take 1 capsule (40 mg total) by mouth daily.  Dispense: 90 capsule; Refill: 1 - Ambulatory referral to Gastroenterology  4. Screening  for metabolic disorder - [id number]; Future - Lipid panel; Future   Meds ordered this encounter  Medications  . traZODone (DESYREL) 50 MG tablet    Sig: Take 1 tablet (50 mg total) by mouth at bedtime.    Dispense:  90 tablet    Refill:  1  . sertraline (ZOLOFT) 25 MG tablet    Sig: Take 1 tablet (25 mg total) by mouth daily.    Dispense:  90 tablet    Refill:  1  . omeprazole (PRILOSEC) 40 MG capsule    Sig: Take 1 capsule (40 mg total) by mouth daily.    Dispense:  90 capsule    Refill:  1    Follow-up: Return in about 3 months (around 06/15/2018) for follow up of chronic medical conditions.   Charlott Rakes MD

## 2018-03-22 ENCOUNTER — Ambulatory Visit: Payer: PPO | Attending: Family Medicine

## 2018-03-22 DIAGNOSIS — Z13228 Encounter for screening for other metabolic disorders: Secondary | ICD-10-CM | POA: Diagnosis not present

## 2018-03-22 NOTE — Progress Notes (Signed)
Patient here for lab visit only 

## 2018-03-23 LAB — CMP14+EGFR
A/G RATIO: 2.3 — AB (ref 1.2–2.2)
ALT: 17 IU/L (ref 0–32)
AST: 25 IU/L (ref 0–40)
Albumin: 4.3 g/dL (ref 3.6–4.8)
Alkaline Phosphatase: 87 IU/L (ref 39–117)
BUN/Creatinine Ratio: 21 (ref 12–28)
BUN: 14 mg/dL (ref 8–27)
Bilirubin Total: 0.3 mg/dL (ref 0.0–1.2)
CALCIUM: 8.9 mg/dL (ref 8.7–10.3)
CO2: 24 mmol/L (ref 20–29)
CREATININE: 0.67 mg/dL (ref 0.57–1.00)
Chloride: 101 mmol/L (ref 96–106)
GFR calc Af Amer: 106 mL/min/{1.73_m2} (ref >59)
GFR, EST NON AFRICAN AMERICAN: 92 mL/min/{1.73_m2} (ref >59)
GLOBULIN, TOTAL: 1.9 g/dL (ref 1.5–4.5)
Glucose: 86 mg/dL (ref 65–99)
POTASSIUM: 4.4 mmol/L (ref 3.5–5.2)
SODIUM: 139 mmol/L (ref 134–144)
TOTAL PROTEIN: 6.2 g/dL (ref 6.0–8.5)

## 2018-03-23 LAB — LIPID PANEL
CHOLESTEROL TOTAL: 205 mg/dL — AB (ref 100–199)
Chol/HDL Ratio: 2.4 ratio (ref 0.0–4.4)
HDL: 87 mg/dL (ref >39)
LDL Calculated: 107 mg/dL — ABNORMAL HIGH (ref 0–99)
TRIGLYCERIDES: 53 mg/dL (ref 0–149)
VLDL Cholesterol Cal: 11 mg/dL (ref 5–40)

## 2018-03-24 ENCOUNTER — Other Ambulatory Visit: Payer: Self-pay | Admitting: Internal Medicine

## 2018-03-24 DIAGNOSIS — G373 Acute transverse myelitis in demyelinating disease of central nervous system: Secondary | ICD-10-CM

## 2018-03-24 DIAGNOSIS — F331 Major depressive disorder, recurrent, moderate: Secondary | ICD-10-CM

## 2018-03-24 DIAGNOSIS — M62838 Other muscle spasm: Secondary | ICD-10-CM

## 2018-04-12 DIAGNOSIS — H2513 Age-related nuclear cataract, bilateral: Secondary | ICD-10-CM | POA: Diagnosis not present

## 2018-04-12 DIAGNOSIS — H43813 Vitreous degeneration, bilateral: Secondary | ICD-10-CM | POA: Diagnosis not present

## 2018-04-12 DIAGNOSIS — H33321 Round hole, right eye: Secondary | ICD-10-CM | POA: Diagnosis not present

## 2018-04-16 ENCOUNTER — Encounter (INDEPENDENT_AMBULATORY_CARE_PROVIDER_SITE_OTHER): Payer: Self-pay | Admitting: Ophthalmology

## 2018-04-23 NOTE — Progress Notes (Addendum)
[facility] Note  04/26/2018     CHIEF COMPLAINT Patient presents for Retina Evaluation   HISTORY OF PRESENT ILLNESS: Gail Gilbert is a 67 y.o. female who presents to the clinic today for:   HPI    Retina Evaluation    In both eyes.  Associated Symptoms Flashes and Floaters.  Negative for Blind Spot, Glare, Shoulder/Hip pain, Fatigue, Jaw Claudication, Weight Loss, Fever, Trauma, Pain, Scalp Tenderness, Redness, Photophobia and Distortion.  Context:  distance vision, mid-range vision and near vision.  Treatments tried include no treatments.  I, the attending physician,  performed the HPI with the patient and updated documentation appropriately.          Comments    Referral of Dr. Shirleen Schirmer for retina evaluation. Patient states in November of 2017 she saw a retina specialist for "arch of lights" in her left eye, she continues to have the flashes of light,in the past three years,  she occasionally has floaters ou,"not as much now". Denies ocular pain. Denies vi'ts/gtt's       Last edited by Bernarda Caffey, MD on 04/26/2018  9:25 AM. (History)    Pt states she was referred by Dr. Katy Fitch for concern of flashes OD; Pt states she was seen by a retinal specialist in [place], [place] for flashes OS; Pt states "there was nothing to be done about it";  Referring physician: Warden Fillers, MD [address] [place], [place] [id number]  HISTORICAL INFORMATION:   Selected notes from the MEDICAL RECORD NUMBER Referred by Dr. Midge Aver for concern of tiny flap tear vs vitreous tufts OD LEE: 04.30.19 [C. Groat) [BCVA: OD: 20/20 OS: 20/20] Ocular Hx-Cataract OU, Vitreous Degeneration OU, Operculated Hole OD PMH-    CURRENT MEDICATIONS: Current Outpatient Medications (Ophthalmic Drugs)  Medication Sig  . prednisoLONE acetate (PRED FORTE) 1 % ophthalmic suspension Place 1 drop into the right eye 4 (four) times daily for 7 days.   No current  facility-administered medications for this visit.  (Ophthalmic Drugs)   Current Outpatient Medications (Other)  Medication Sig  . omeprazole (PRILOSEC) 40 MG capsule Take 1 capsule (40 mg total) by mouth daily.  Marland Kitchen omeprazole (PRILOSEC) 40 MG capsule TAKE 1 CAPSULE(40 MG) BY MOUTH DAILY BEFORE DINNER  . sertraline (ZOLOFT) 25 MG tablet Take 1 tablet (25 mg total) by mouth daily.  . traZODone (DESYREL) 50 MG tablet Take 1 tablet (50 mg total) by mouth at bedtime.  . sertraline (ZOLOFT) 50 MG tablet Take by mouth.  . TRAZODONE HCL PO Take by mouth.   No current facility-administered medications for this visit.  (Other)      REVIEW OF SYSTEMS: ROS    Positive for: Neurological, Musculoskeletal, Eyes, Psychiatric   Negative for: Constitutional, Gastrointestinal, Skin, Genitourinary, HENT, Endocrine, Cardiovascular, Respiratory, Allergic/Imm, Heme/Lymph   Last edited by Zenovia Jordan, LPN on 08/12/9370  6:96 AM. (History)       ALLERGIES Allergies  Allergen Reactions  . Antihistamines, Diphenhydramine-Type Palpitations    PAST MEDICAL HISTORY Past Medical History:  Diagnosis Date  . Cataract, immature   . Dental crowns present   . Hammer toe of right foot 07/2016   2nd and 3rd toes  . IBS (irritable bowel syndrome)    no current problems  . Idiopathic transverse myelitis ([place]) 2014   residual numbness feet and right small finger  . Osteoarthritis    cervical spine, lumbar spine  . RLS (restless legs syndrome)  no current med.   Past Surgical History:  Procedure Laterality Date  . BUNIONECTOMY Bilateral   . COLONOSCOPY WITH PROPOFOL  11/26/2015  . HAMMER TOE SURGERY  10/29/2011   Procedure: HAMMER TOE CORRECTION;  Surgeon: Kerin Salen;  Location: [facility];  Service: Orthopedics;  Laterality: Left;  Left 2nd toe duvries arthroplasty  . HAMMER TOE SURGERY Right 07/23/2016   Procedure: [place];  Surgeon: Frederik Pear,  MD;  Location: [facility];  Service: Orthopedics;  Laterality: Right;  . MIDDLE EAR SURGERY     to relieve ringing in ear  . SYNOVIAL CYST EXCISION     lumbar back    FAMILY HISTORY Family History  Problem Relation Age of Onset  . Arthritis Mother        spinal stenosis  . Cataracts Mother   . Heart disease Mother        pacemaker  . Hypertension Father   . Heart disease Father        Quarry manager  . Congestive Heart Failure Father     SOCIAL HISTORY Social History   Tobacco Use  . Smoking status: Never Smoker  . Smokeless tobacco: Never Used  Substance Use Topics  . Alcohol use: Yes    Alcohol/week: 0.0 oz    Comment: occasionally  . Drug use: No         OPHTHALMIC EXAM:  Base Eye Exam    Visual Acuity (Snellen - Linear)      Right Left   Dist cc 20/30 - 20/20   Dist ph cc 20/20 -    Correction:  Glasses       Tonometry (Tonopen, 9:00 AM)      Right Left   Pressure 16 15       Pupils      Dark Light Shape React APD   Right 4 3 Round Brisk None   Left 4 3 Round Brisk None       Visual Fields (Counting fingers)      Left Right    Full Full       Extraocular Movement      Right Left    Full, Ortho Full, Ortho       Neuro/Psych    Oriented x3:  Yes   Mood/Affect:  Normal       Dilation    Both eyes:  1.0% Mydriacyl, 2.5% Phenylephrine @ 9:00 AM        Slit Lamp and Fundus Exam    Slit Lamp Exam      Right Left   Lids/Lashes Dermatochalasis - upper lid Dermatochalasis - upper lid   Conjunctiva/Sclera White and quiet White and quiet   Cornea Mild Arcus, otherwise clear Mild Arcus, 0.54mm round sub-epi scar at 0700 mid-zone   Anterior Chamber Deep and quiet, no cell or flare Deep and quiet, no cell or flare   Iris Round and dilated Round and dilated   Lens 2+ Nuclear sclerosis, 2+ Cortical cataract 2+ Nuclear sclerosis, 2+ Cortical cataract   Vitreous Vitreous syneresis, Posterior vitreous detachment Vitreous  syneresis, Posterior vitreous detachment       Fundus Exam      Right Left   Disc Pink and Sharp Pink and Sharp   C/D Ratio 0.5 0.5   Macula Flat, mild Retinal pigment epithelial mottling, No heme or edema Flat, mild Retinal pigment epithelial mottling, No heme or edema   Vessels Mild Vascular attenuation Mild Vascular  attenuation   Periphery Attached, round operculated hole at 1030 with surrounding pigment -- no SRF, small flap tear at 0200 Attached, mild reticular degeneration inferiorly, cobblestoning inferiorly, pigmented Chorioretinal scar at 0130 -- no SRF        Refraction    Wearing Rx      Sphere Cylinder Axis Add   Right -2.25 +3.50 085 +2.50   Left -2.25 +3.25 095 +2.50   Age:  60yr   Type:  PAL       Manifest Refraction      Sphere Cylinder Axis Dist VA   Right -2.00 +3.50 085 20/30   Left -2.00 +3.25 093 20/20          IMAGING AND PROCEDURES  Imaging and Procedures for @TODAY @  OCT, Retina - OU - Both Eyes       Right Eye Quality was good. Central Foveal Thickness: 271. Progression has no prior data. Findings include normal foveal contour, no IRF, no SRF.   Left Eye Quality was good. Central Foveal Thickness: 269. Progression has no prior data. Findings include normal foveal contour, no IRF, no SRF (Trace ERM).   Notes *Images captured and stored on drive  Diagnosis / Impression:  NFP, No IRF/SRF OU  Clinical management:  See below  Abbreviations: NFP - Normal foveal profile. CME - cystoid macular edema. PED - pigment epithelial detachment. IRF - intraretinal fluid. SRF - subretinal fluid. EZ - ellipsoid zone. ERM - epiretinal membrane. ORA - outer retinal atrophy. ORT - outer retinal tubulation. SRHM - subretinal hyper-reflective material         Repair Retinal Breaks, Laser - OD - Right Eye       LASER PROCEDURE NOTE  Procedure:  Barrier laser retinopexy using laser indirect ophthalmoscope, RIGHT eye   Diagnosis:   Retinal breaks, RIGHT  eye                     Operculated hole at 130                         Small flap tear at 0200  Surgeon: Bernarda Caffey, MD, PhD  Anesthesia: Topical  Informed consent obtained, operative eye marked, and time out performed prior to initiation of laser.   Laser settings:  Lumenis [id number] laser indirect ophthalmoscope Power: 200 mW Duration: 70 msec  # spots: 604  Placement of laser: Laser was placed in three confluent rows around small flap tear at 0200 oclock anterior to equator and operculated hole at 1030 with additional rows anteriorly for both lesions.  Complications: None.  Patient tolerated the procedure well and received written and verbal post-procedure care information/education.                 ASSESSMENT/PLAN:    ICD-10-CM   1. Retinal breaks without detachment H35.89 Repair Retinal Breaks, Laser - OD - Right Eye  2. Posterior vitreous detachment of both eyes H43.813   3. Retinal edema H35.81 OCT, Retina - OU - Both Eyes  4. Nuclear sclerosis of both eyes H25.13     1. Retinal breaks, OD   - round operculated hole at 1030 with surrounding pigment -- no SRF, small flap tear at 0230 - The incidence, risk factors, and natural history of retinal tear was discussed with patient.   - Potential treatment options including laser retinopexy and cryotherapy discussed with patient. - recommend laser retinopexy OD today (05.13.19) for both lesions - RBA  of procedure discussed, questions answered - informed consent obtained and signed - see procedure note - start PF QID OD x7 days - f/u in 1-2 wks  2. PVD / vitreous syneresis OU  Discussed findings and prognosis  No other RT or RD on 360 scleral depressed exam  Reviewed s/s of RT/RD  Strict return precautions for any such RT/RD signs/symptoms  3. No retinal edema on exam or OCT  4. Combined form age-related cataract OU-  - The symptoms of cataract, surgical options, and treatments and risks were discussed  with patient. - discussed diagnosis and progression - not yet visually significant - monitor for now   Ophthalmic Meds Ordered this visit:  Meds ordered this encounter  Medications  . prednisoLONE acetate (PRED FORTE) 1 % ophthalmic suspension    Sig: Place 1 drop into the right eye 4 (four) times daily for 7 days.    Dispense:  10 mL    Refill:  0       Return in about 2 weeks (around 05/10/2018) for F/U laser retinopexy OD, DFE.  There are no Patient Instructions on file for this visit.   Explained the diagnoses, plan, and follow up with the patient and they expressed understanding.  Patient expressed understanding of the importance of proper follow up care.   This document serves as a record of services personally performed by Gardiner Sleeper, MD, PhD. It was created on their behalf by Ernest Mallick, OA, an ophthalmic assistant. The creation of this record is the provider's dictation and/or activities during the visit.    Electronically signed by: Ernest Mallick, OA  05.10.2019 1:32 PM    Gardiner Sleeper, M.D., Ph.D. Diseases & Surgery of the Retina and Vitreous [facility]   I have reviewed the above documentation for accuracy and completeness, and I agree with the above. Gardiner Sleeper, M.D., Ph.D. 04/26/18 1:32 PM       Abbreviations: M myopia (nearsighted); A astigmatism; H hyperopia (farsighted); P presbyopia; Mrx spectacle prescription;  CTL contact lenses; OD right eye; OS left eye; OU both eyes  XT exotropia; ET esotropia; PEK punctate epithelial keratitis; PEE punctate epithelial erosions; DES dry eye syndrome; MGD meibomian gland dysfunction; ATs artificial tears; PFAT's preservative free artificial tears; [place] nuclear sclerotic cataract; PSC posterior subcapsular cataract; ERM epi-retinal membrane; PVD posterior vitreous detachment; RD retinal detachment; DM diabetes mellitus; DR diabetic retinopathy; NPDR non-proliferative diabetic  retinopathy; PDR proliferative diabetic retinopathy; CSME clinically significant macular edema; DME diabetic macular edema; dbh dot blot hemorrhages; CWS cotton wool spot; POAG primary open angle glaucoma; C/D cup-to-disc ratio; HVF humphrey visual field; GVF goldmann visual field; OCT optical coherence tomography; IOP intraocular pressure; BRVO Branch retinal vein occlusion; CRVO central retinal vein occlusion; CRAO central retinal artery occlusion; BRAO branch retinal artery occlusion; RT retinal tear; SB scleral buckle; PPV pars plana vitrectomy; VH Vitreous hemorrhage; PRP panretinal laser photocoagulation; IVK intravitreal kenalog; VMT vitreomacular traction; MH Macular hole;  NVD neovascularization of the disc; NVE neovascularization elsewhere; AREDS age related eye disease study; ARMD age related macular degeneration; POAG primary open angle glaucoma; EBMD epithelial/anterior basement membrane dystrophy; ACIOL anterior chamber intraocular lens; IOL intraocular lens; PCIOL posterior chamber intraocular lens; Phaco/IOL phacoemulsification with intraocular lens placement; [place] photorefractive keratectomy; LASIK laser assisted in situ keratomileusis; HTN hypertension; DM diabetes mellitus; COPD chronic obstructive pulmonary disease

## 2018-04-26 ENCOUNTER — Encounter (INDEPENDENT_AMBULATORY_CARE_PROVIDER_SITE_OTHER): Payer: Self-pay | Admitting: Ophthalmology

## 2018-04-26 ENCOUNTER — Ambulatory Visit (INDEPENDENT_AMBULATORY_CARE_PROVIDER_SITE_OTHER): Payer: PPO | Admitting: Ophthalmology

## 2018-04-26 DIAGNOSIS — H3581 Retinal edema: Secondary | ICD-10-CM | POA: Diagnosis not present

## 2018-04-26 DIAGNOSIS — H43813 Vitreous degeneration, bilateral: Secondary | ICD-10-CM | POA: Diagnosis not present

## 2018-04-26 DIAGNOSIS — H2513 Age-related nuclear cataract, bilateral: Secondary | ICD-10-CM | POA: Diagnosis not present

## 2018-04-26 DIAGNOSIS — H3589 Other specified retinal disorders: Secondary | ICD-10-CM

## 2018-04-26 MED ORDER — PREDNISOLONE ACETATE 1 % OP SUSP: 1.0000 [drp] | Freq: Four times a day (QID) | OPHTHALMIC | 0 refills | Status: AC

## 2018-04-29 ENCOUNTER — Telehealth: Payer: Self-pay | Admitting: Family Medicine

## 2018-04-29 NOTE — Telephone Encounter (Signed)
Patient called and requested that she would like to try the gabapentin that was talked about in her last visit. Please fu at your earliest convenience.  [id number] - [place], [place]

## 2018-04-30 MED ORDER — GABAPENTIN 300 MG PO CAPS: 300.0000 mg | ORAL_CAPSULE | Freq: Two times a day (BID) | ORAL | 3 refills | Status: DC

## 2018-04-30 NOTE — Telephone Encounter (Signed)
Prescription sent to her pharmacy

## 2018-04-30 NOTE — Telephone Encounter (Signed)
Patient was informed and verbalized understanding.

## 2018-05-06 NOTE — Progress Notes (Addendum)
[facility] Note  05/11/2018     CHIEF COMPLAINT Patient presents for Post-op Follow-up   HISTORY OF PRESENT ILLNESS: Gail Gilbert is a 67 y.o. female who presents to the clinic today for:   HPI    Post-op Follow-up    In right eye.  Discomfort includes none.  Negative for pain, itching, foreign body sensation, tearing, discharge and floaters.  Vision is stable.  I, the attending physician,  performed the HPI with the patient and updated documentation appropriately.          Comments    S/P laser retinopexy OD (05.13.19); Pt states OU VA is stable; Pt reports no new floaters, denies flashes, denies wavy VA; Pt states OU are comfortable;        Last edited by Cherrie Gauze, COA on 05/11/2018  8:39 AM. (History)      Referring physician: Charlott Rakes, MD [place], [place] [address]  HISTORICAL INFORMATION:   Selected notes from the MEDICAL RECORD NUMBER Referred by Dr. Midge Aver for concern of tiny flap tear vs vitreous tufts OD LEE: 04.30.19 [C. Groat) [BCVA: OD: 20/20 OS: 20/20] Ocular Hx-Cataract OU, Vitreous Degeneration OU, Operculated Hole OD PMH-    CURRENT MEDICATIONS: No current outpatient medications on file. (Ophthalmic Drugs)   No current facility-administered medications for this visit.  (Ophthalmic Drugs)   Current Outpatient Medications (Other)  Medication Sig  . gabapentin (NEURONTIN) 300 MG capsule Take 1 capsule (300 mg total) by mouth 2 (two) times daily.  Marland Kitchen omeprazole (PRILOSEC) 40 MG capsule Take 1 capsule (40 mg total) by mouth daily.  Marland Kitchen omeprazole (PRILOSEC) 40 MG capsule TAKE 1 CAPSULE(40 MG) BY MOUTH DAILY BEFORE DINNER  . sertraline (ZOLOFT) 25 MG tablet Take 1 tablet (25 mg total) by mouth daily.  . sertraline (ZOLOFT) 50 MG tablet Take by mouth.  . traZODone (DESYREL) 50 MG tablet Take 1 tablet (50 mg total) by mouth at bedtime.  . TRAZODONE HCL PO Take by mouth.   No current  facility-administered medications for this visit.  (Other)      REVIEW OF SYSTEMS: ROS    Positive for: Musculoskeletal, Eyes   Negative for: Constitutional, Gastrointestinal, Neurological, Skin, Genitourinary, HENT, Endocrine, Cardiovascular, Respiratory, Psychiatric, Allergic/Imm, Heme/Lymph   Last edited by Cherrie Gauze, COA on 05/11/2018  8:32 AM. (History)       ALLERGIES Allergies  Allergen Reactions  . Antihistamines, Diphenhydramine-Type Palpitations    PAST MEDICAL HISTORY Past Medical History:  Diagnosis Date  . Cataract, immature   . Dental crowns present   . Hammer toe of right foot 07/2016   2nd and 3rd toes  . IBS (irritable bowel syndrome)    no current problems  . Idiopathic transverse myelitis ([place]) 2014   residual numbness feet and right small finger  . Osteoarthritis    cervical spine, lumbar spine  . RLS (restless legs syndrome)    no current med.   Past Surgical History:  Procedure Laterality Date  . BUNIONECTOMY Bilateral   . COLONOSCOPY WITH PROPOFOL  11/26/2015  . HAMMER TOE SURGERY  10/29/2011   Procedure: HAMMER TOE CORRECTION;  Surgeon: Kerin Salen;  Location: [facility];  Service: Orthopedics;  Laterality: Left;  Left 2nd toe duvries arthroplasty  . HAMMER TOE SURGERY Right 07/23/2016   Procedure: [place];  Surgeon: Frederik Pear, MD;  Location: [facility];  Service: Orthopedics;  Laterality: Right;  . MIDDLE EAR SURGERY     to relieve ringing in ear  . SYNOVIAL CYST EXCISION     lumbar back    FAMILY HISTORY Family History  Problem Relation Age of Onset  . Arthritis Mother        spinal stenosis  . Cataracts Mother   . Heart disease Mother        pacemaker  . Hypertension Father   . Heart disease Father        Quarry manager  . Congestive Heart Failure Father     SOCIAL HISTORY Social History   Tobacco Use  . Smoking status: Never Smoker  .  Smokeless tobacco: Never Used  Substance Use Topics  . Alcohol use: Yes    Alcohol/week: 0.0 oz    Comment: occasionally  . Drug use: No         OPHTHALMIC EXAM:  Base Eye Exam    Visual Acuity (Snellen - Linear)      Right Left   Dist cc 20/30 20/20   Dist ph cc 20/25 20/20   Correction:  Glasses       Tonometry (Tonopen, 8:43 AM)      Right Left   Pressure 13 13       Pupils      Dark Light Shape React APD   Right 4 3 Round Brisk None   Left 4 3 Round Brisk None       Visual Fields (Counting fingers)      Left Right    Full Full       Extraocular Movement      Right Left    Full, Ortho Full, Ortho       Neuro/Psych    Oriented x3:  Yes   Mood/Affect:  Normal       Dilation    Both eyes:  1.0% Mydriacyl, 2.5% Phenylephrine @ 8:43 AM        Slit Lamp and Fundus Exam    Slit Lamp Exam      Right Left   Lids/Lashes Dermatochalasis - upper lid Dermatochalasis - upper lid   Conjunctiva/Sclera White and quiet White and quiet   Cornea Mild Arcus, otherwise clear Mild Arcus, 0.64mm round sub-epi scar at 0700 mid-zone   Anterior Chamber Deep and quiet, no cell or flare Deep and quiet, no cell or flare   Iris Round and dilated Round and dilated   Lens 2+ Nuclear sclerosis, 2+ Cortical cataract 2+ Nuclear sclerosis, 2+ Cortical cataract   Vitreous Vitreous syneresis, Posterior vitreous detachment Vitreous syneresis, Posterior vitreous detachment       Fundus Exam      Right Left   Disc Pink and Sharp Pink and Sharp   C/D Ratio 0.5 0.5   Macula Flat, mild Retinal pigment epithelial mottling, No heme or edema Flat, mild Retinal pigment epithelial mottling, No heme or edema   Vessels Mild Vascular attenuation Mild Vascular attenuation   Periphery Attached, round operculated hole at 1030 with surrounding pigment -- good laser surrounding, small flap tear at 0200 -- early laser changes surrounding Attached, mild reticular degeneration inferiorly, cobblestoning  inferiorly, pigmented Chorioretinal scar at 0130 -- no SRF          IMAGING AND PROCEDURES  Imaging and Procedures for @TODAY @  OCT, Retina - OU - Both Eyes       Right Eye Quality was good. Central Foveal Thickness: 272. Progression has been stable. Findings include normal foveal contour,  no IRF, no SRF.   Left Eye Quality was good. Central Foveal Thickness: 269. Progression has been stable. Findings include normal foveal contour, no IRF, no SRF (Trace ERM).   Notes *Images captured and stored on drive  Diagnosis / Impression:  NFP, No IRF/SRF OU -- stable OU  Clinical management:  See below  Abbreviations: NFP - Normal foveal profile. CME - cystoid macular edema. PED - pigment epithelial detachment. IRF - intraretinal fluid. SRF - subretinal fluid. EZ - ellipsoid zone. ERM - epiretinal membrane. ORA - outer retinal atrophy. ORT - outer retinal tubulation. SRHM - subretinal hyper-reflective material                  ASSESSMENT/PLAN:    ICD-10-CM   1. Retinal breaks without detachment H35.89   2. Posterior vitreous detachment of both eyes H43.813   3. Retinal edema H35.81 OCT, Retina - OU - Both Eyes  4. Nuclear sclerosis of both eyes H25.13     1. Retinal breaks, OD   - round operculated hole at 1030 with surrounding pigment -- no SRF, small flap tear at 0230 - S/P laser retinopexy OD today (05.13.19) -- good early laser changes - finished PF QID OD x7 days - f/u 6 wks - repeat depressed exam  2. PVD / vitreous syneresis OU  Discussed findings and prognosis  No other RT or RD on 360 peripheral exam  Reviewed s/s of RT/RD  Strict return precautions for any such RT/RD signs/symptoms  3. No retinal edema on exam or OCT  4. Combined form age-related cataract OU-  - The symptoms of cataract, surgical options, and treatments and risks were discussed with patient. - discussed diagnosis and progression - not yet visually significant - monitor for  now   Ophthalmic Meds Ordered this visit:  No orders of the defined types were placed in this encounter.      Return in about 6 weeks (around 06/22/2018) for POV.  There are no Patient Instructions on file for this visit.   Explained the diagnoses, plan, and follow up with the patient and they expressed understanding.  Patient expressed understanding of the importance of proper follow up care.   This document serves as a record of services personally performed by Gardiner Sleeper, MD, PhD. It was created on their behalf by Ernest Mallick, OA, an ophthalmic assistant. The creation of this record is the provider's dictation and/or activities during the visit.    Electronically signed by: Ernest Mallick, OA  05.23.2019 9:20 AM    Gardiner Sleeper, M.D., Ph.D. Diseases & Surgery of the Retina and [place] 05.28.2019   I have reviewed the above documentation for accuracy and completeness, and I agree with the above. Gardiner Sleeper, M.D., Ph.D. 05/11/18 9:20 AM       Abbreviations: M myopia (nearsighted); A astigmatism; H hyperopia (farsighted); P presbyopia; Mrx spectacle prescription;  CTL contact lenses; OD right eye; OS left eye; OU both eyes  XT exotropia; ET esotropia; PEK punctate epithelial keratitis; PEE punctate epithelial erosions; DES dry eye syndrome; MGD meibomian gland dysfunction; ATs artificial tears; PFAT's preservative free artificial tears; [place] nuclear sclerotic cataract; PSC posterior subcapsular cataract; ERM epi-retinal membrane; PVD posterior vitreous detachment; RD retinal detachment; DM diabetes mellitus; DR diabetic retinopathy; NPDR non-proliferative diabetic retinopathy; PDR proliferative diabetic retinopathy; CSME clinically significant macular edema; DME diabetic macular edema; dbh dot blot hemorrhages; CWS cotton wool spot; POAG primary open angle glaucoma; C/D cup-to-disc  ratio; HVF humphrey visual field; GVF goldmann visual  field; OCT optical coherence tomography; IOP intraocular pressure; BRVO Branch retinal vein occlusion; CRVO central retinal vein occlusion; CRAO central retinal artery occlusion; BRAO branch retinal artery occlusion; RT retinal tear; SB scleral buckle; PPV pars plana vitrectomy; VH Vitreous hemorrhage; PRP panretinal laser photocoagulation; IVK intravitreal kenalog; VMT vitreomacular traction; MH Macular hole;  NVD neovascularization of the disc; NVE neovascularization elsewhere; AREDS age related eye disease study; ARMD age related macular degeneration; POAG primary open angle glaucoma; EBMD epithelial/anterior basement membrane dystrophy; ACIOL anterior chamber intraocular lens; IOL intraocular lens; PCIOL posterior chamber intraocular lens; Phaco/IOL phacoemulsification with intraocular lens placement; [place] photorefractive keratectomy; LASIK laser assisted in situ keratomileusis; HTN hypertension; DM diabetes mellitus; COPD chronic obstructive pulmonary disease

## 2018-05-11 ENCOUNTER — Ambulatory Visit (INDEPENDENT_AMBULATORY_CARE_PROVIDER_SITE_OTHER): Payer: PPO | Admitting: Ophthalmology

## 2018-05-11 ENCOUNTER — Encounter (INDEPENDENT_AMBULATORY_CARE_PROVIDER_SITE_OTHER): Payer: Self-pay | Admitting: Ophthalmology

## 2018-05-11 DIAGNOSIS — H3581 Retinal edema: Secondary | ICD-10-CM

## 2018-05-11 DIAGNOSIS — H2513 Age-related nuclear cataract, bilateral: Secondary | ICD-10-CM

## 2018-05-11 DIAGNOSIS — H43813 Vitreous degeneration, bilateral: Secondary | ICD-10-CM

## 2018-05-11 DIAGNOSIS — H3589 Other specified retinal disorders: Secondary | ICD-10-CM

## 2018-05-20 DIAGNOSIS — D225 Melanocytic nevi of trunk: Secondary | ICD-10-CM | POA: Diagnosis not present

## 2018-05-20 DIAGNOSIS — B078 Other viral warts: Secondary | ICD-10-CM | POA: Diagnosis not present

## 2018-05-28 ENCOUNTER — Encounter (INDEPENDENT_AMBULATORY_CARE_PROVIDER_SITE_OTHER): Payer: PPO | Admitting: Ophthalmology

## 2018-06-02 ENCOUNTER — Ambulatory Visit: Payer: PPO | Admitting: Gastroenterology

## 2018-06-15 ENCOUNTER — Ambulatory Visit: Payer: PPO | Admitting: Family Medicine

## 2018-08-11 ENCOUNTER — Encounter: Payer: Self-pay | Admitting: Gastroenterology

## 2018-08-11 ENCOUNTER — Encounter

## 2018-08-11 ENCOUNTER — Ambulatory Visit: Payer: PPO | Admitting: Gastroenterology

## 2018-08-11 VITALS — BP 100/72 | HR 60 | Ht 68.0 in | Wt 143.0 lb

## 2018-08-11 DIAGNOSIS — R1310 Dysphagia, unspecified: Secondary | ICD-10-CM | POA: Diagnosis not present

## 2018-08-11 DIAGNOSIS — K219 Gastro-esophageal reflux disease without esophagitis: Secondary | ICD-10-CM | POA: Diagnosis not present

## 2018-08-11 MED ORDER — RANITIDINE HCL 150 MG PO TABS: 150.0000 mg | ORAL_TABLET | Freq: Every day | ORAL | 3 refills | Status: DC

## 2018-08-11 NOTE — Patient Instructions (Signed)
You have been scheduled for an endoscopy. Please follow written instructions given to you at your visit today. If you use inhalers (even only as needed), please bring them with you on the day of your procedure. Your physician has requested that you go to www.startemmi.com and enter the access code given to you at your visit today. This web site gives a general overview about your procedure. However, you should still follow specific instructions given to you by our office regarding your preparation for the procedure.  We have sent Zantac to your pharmacy   Gastroesophageal Reflux Disease, Adult Normally, food travels down the esophagus and stays in the stomach to be digested. However, when a person has gastroesophageal reflux disease (GERD), food and stomach acid move back up into the esophagus. When this happens, the esophagus becomes sore and inflamed. Over time, GERD can create small holes (ulcers) in the lining of the esophagus. What are the causes? This condition is caused by a problem with the muscle between the esophagus and the stomach (lower esophageal sphincter, or LES). Normally, the LES muscle closes after food passes through the esophagus to the stomach. When the LES is weakened or abnormal, it does not close properly, and that allows food and stomach acid to go back up into the esophagus. The LES can be weakened by certain dietary substances, medicines, and medical conditions, including:  Tobacco use.  Pregnancy.  Having a hiatal hernia.  Heavy alcohol use.  Certain foods and beverages, such as coffee, chocolate, onions, and peppermint.  What increases the risk? This condition is more likely to develop in:  People who have an increased body weight.  People who have connective tissue disorders.  People who use NSAID medicines.  What are the signs or symptoms? Symptoms of this condition include:  Heartburn.  Difficult or painful swallowing.  The feeling of having a  lump in the throat.  Abitter taste in the mouth.  Bad breath.  Having a large amount of saliva.  Having an upset or bloated stomach.  Belching.  Chest pain.  Shortness of breath or wheezing.  Ongoing (chronic) cough or a night-time cough.  Wearing away of tooth enamel.  Weight loss.  Different conditions can cause chest pain. Make sure to see your health care provider if you experience chest pain. How is this diagnosed? Your health care provider will take a medical history and perform a physical exam. To determine if you have mild or severe GERD, your health care provider may also monitor how you respond to treatment. You may also have other tests, including:  An endoscopy toexamine your stomach and esophagus with a small camera.  A test thatmeasures the acidity level in your esophagus.  A test thatmeasures how much pressure is on your esophagus.  A barium swallow or modified barium swallow to show the shape, size, and functioning of your esophagus.  How is this treated? The goal of treatment is to help relieve your symptoms and to prevent complications. Treatment for this condition may vary depending on how severe your symptoms are. Your health care provider may recommend:  Changes to your diet.  Medicine.  Surgery.  Follow these instructions at home: Diet  Follow a diet as recommended by your health care provider. This may involve avoiding foods and drinks such as: ? Coffee and tea (with or without caffeine). ? Drinks that containalcohol. ? Energy drinks and sports drinks. ? Carbonated drinks or sodas. ? Chocolate and cocoa. ? Peppermint and mint flavorings. ?  Garlic and onions. ? Horseradish. ? Spicy and acidic foods, including peppers, chili powder, curry powder, vinegar, hot sauces, and barbecue sauce. ? Citrus fruit juices and citrus fruits, such as oranges, lemons, and limes. ? Tomato-based foods, such as red sauce, chili, salsa, and pizza with red  sauce. ? Fried and fatty foods, such as donuts, french fries, potato chips, and high-fat dressings. ? High-fat meats, such as hot dogs and fatty cuts of red and white meats, such as rib eye steak, sausage, ham, and bacon. ? High-fat dairy items, such as whole milk, butter, and cream cheese.  Eat small, frequent meals instead of large meals.  Avoid drinking large amounts of liquid with your meals.  Avoid eating meals during the 2-3 hours before bedtime.  Avoid lying down right after you eat.  Do not exercise right after you eat. General instructions  Pay attention to any changes in your symptoms.  Take over-the-counter and prescription medicines only as told by your health care provider. Do not take aspirin, ibuprofen, or other NSAIDs unless your health care provider told you to do so.  Do not use any tobacco products, including cigarettes, chewing tobacco, and e-cigarettes. If you need help quitting, ask your health care provider.  Wear loose-fitting clothing. Do not wear anything tight around your waist that causes pressure on your abdomen.  Raise (elevate) the head of your bed 6 inches (15cm).  Try to reduce your stress, such as with yoga or meditation. If you need help reducing stress, ask your health care provider.  If you are overweight, reduce your weight to an amount that is healthy for you. Ask your health care provider for guidance about a safe weight loss goal.  Keep all follow-up visits as told by your health care provider. This is important. Contact a health care provider if:  You have new symptoms.  You have unexplained weight loss.  You have difficulty swallowing, or it hurts to swallow.  You have wheezing or a persistent cough.  Your symptoms do not improve with treatment.  You have a hoarse voice. Get help right away if:  You have pain in your arms, neck, jaw, teeth, or back.  You feel sweaty, dizzy, or light-headed.  You have chest pain or shortness  of breath.  You vomit and your vomit looks like blood or coffee grounds.  You faint.  Your stool is bloody or black.  You cannot swallow, drink, or eat. This information is not intended to replace advice given to you by your health care provider. Make sure you discuss any questions you have with your health care provider. Document Released: 09/10/2005 Document Revised: 04/30/2016 Document Reviewed: 03/28/2015 Elsevier Interactive Patient Education  2018 [place] for Gastroesophageal Reflux Disease, Adult When you have gastroesophageal reflux disease (GERD), the foods you eat and your eating habits are very important. Choosing the right foods can help ease your discomfort. What guidelines do I need to follow?  Choose fruits, vegetables, whole grains, and low-fat dairy products.  Choose low-fat meat, fish, and poultry.  Limit fats such as oils, salad dressings, butter, nuts, and avocado.  Keep a food diary. This helps you identify foods that cause symptoms.  Avoid foods that cause symptoms. These may be different for everyone.  Eat small meals often instead of 3 large meals a day.  Eat your meals slowly, in a place where you are relaxed.  Limit fried foods.  Cook foods using methods other than frying.  Avoid drinking alcohol.  Avoid drinking large amounts of liquids with your meals.  Avoid bending over or lying down until 2-3 hours after eating. What foods are not recommended? These are some foods and drinks that may make your symptoms worse: Vegetables Tomatoes. Tomato juice. Tomato and spaghetti sauce. Chili peppers. Onion and garlic. Horseradish. Fruits Oranges, grapefruit, and lemon (fruit and juice). Meats High-fat meats, fish, and poultry. This includes hot dogs, ribs, ham, sausage, salami, and bacon. Dairy Whole milk and chocolate milk. Sour cream. Cream. Butter. Ice cream. Cream cheese. Drinks Coffee and tea. Bubbly (carbonated) drinks or  energy drinks. Condiments Hot sauce. Barbecue sauce. Sweets/Desserts Chocolate and cocoa. Donuts. Peppermint and spearmint. Fats and Oils High-fat foods. This includes [place] fries and potato chips. Other Vinegar. Strong spices. This includes black pepper, white pepper, red pepper, cayenne, curry powder, cloves, ginger, and chili powder. The items listed above may not be a complete list of foods and drinks to avoid. Contact your dietitian for more information. This information is not intended to replace advice given to you by your health care provider. Make sure you discuss any questions you have with your health care provider. Document Released: 06/01/2012 Document Revised: 05/08/2016 Document Reviewed: 10/05/2013 Elsevier Interactive Patient Education  2017 [employer].

## 2018-08-11 NOTE — Progress Notes (Signed)
Gail Gilbert    [id number]    01/03/1951  Primary Care Physician:Newlin, Charlane Ferretti, MD  Referring Physician: Charlott Rakes, MD [place], [place] [address]  Chief complaint: GERD, hiatal hernia HPI: 67 yr F with history of idiopathic transverse myelitis here with complaints of epigastric abdominal discomfort and burning sensation in the throat especially during bedtime and also has sore throat when she wakes up in the morning.  Patient has intermittent difficulty swallowing with mostly solid foods like dry chicken or dry bread.  No difficulty with soft foods or liquids.  Denies any loss of appetite or weight loss.  She has gained about 3 pounds in the past few months. She was prescribed omeprazole 40 mg daily, took it for about 3 weeks last month and discontinued because of flulike symptoms and exacerbation of joint pain. She takes 2 ibuprofen every day with breakfast in the morning, she was taking meloxicam in the past. Denies any change in bowel habits, dark stool or blood in stool. Colonoscopy 04 December 2015 no polyps, diverticulosis and small internal hemorrhoids.     Outpatient Encounter Medications as of 08/11/2018  Medication Sig  . gabapentin (NEURONTIN) 300 MG capsule Take 1 capsule (300 mg total) by mouth 2 (two) times daily.  . sertraline (ZOLOFT) 25 MG tablet Take 1 tablet (25 mg total) by mouth daily.  . traZODone (DESYREL) 50 MG tablet Take 1 tablet (50 mg total) by mouth at bedtime.  . [DISCONTINUED] omeprazole (PRILOSEC) 40 MG capsule Take 1 capsule (40 mg total) by mouth daily.  . [DISCONTINUED] omeprazole (PRILOSEC) 40 MG capsule TAKE 1 CAPSULE(40 MG) BY MOUTH DAILY BEFORE DINNER  . [DISCONTINUED] sertraline (ZOLOFT) 50 MG tablet Take by mouth.  . [DISCONTINUED] TRAZODONE HCL PO Take by mouth.   No facility-administered encounter medications on file as of 08/11/2018.     Allergies as of 08/11/2018 - Review Complete 05/11/2018    Allergen Reaction Noted  . Antihistamines, diphenhydramine-type Palpitations 02/19/2013    Past Medical History:  Diagnosis Date  . Cataract, immature   . Dental crowns present   . Hammer toe of right foot 07/2016   2nd and 3rd toes  . IBS (irritable bowel syndrome)    no current problems  . Idiopathic transverse myelitis ([place]) 2014   residual numbness feet and right small finger  . Osteoarthritis    cervical spine, lumbar spine  . RLS (restless legs syndrome)    no current med.    Past Surgical History:  Procedure Laterality Date  . BUNIONECTOMY Bilateral   . COLONOSCOPY WITH PROPOFOL  11/26/2015  . HAMMER TOE SURGERY  10/29/2011   Procedure: HAMMER TOE CORRECTION;  Surgeon: Kerin Salen;  Location: [facility];  Service: Orthopedics;  Laterality: Left;  Left 2nd toe duvries arthroplasty  . HAMMER TOE SURGERY Right 07/23/2016   Procedure: [place];  Surgeon: Frederik Pear, MD;  Location: [facility];  Service: Orthopedics;  Laterality: Right;  . MIDDLE EAR SURGERY     to relieve ringing in ear  . SYNOVIAL CYST EXCISION     lumbar back    Family History  Problem Relation Age of Onset  . Arthritis Mother        spinal stenosis  . Cataracts Mother   . Heart disease Mother        pacemaker  . Hypertension Father   . Heart disease Father  pacer/defibrillator  . Congestive Heart Failure Father     Social History   Socioeconomic History  . Marital status: Married    Spouse name: Gershon Mussel  . Number of children: 1  . Years of education: college  . Highest education level: Not on file  Occupational History  . Occupation: unemployed    Comment: former Solicitor Needs  . Financial resource strain: Not on file  . Food insecurity:    Worry: Not on file    Inability: Not on file  . Transportation needs:    Medical: Not on file    Non-medical: Not on file  Tobacco Use  . Smoking status: Never  Smoker  . Smokeless tobacco: Never Used  Substance and Sexual Activity  . Alcohol use: Yes    Alcohol/week: 0.0 standard drinks    Comment: occasionally  . Drug use: No  . Sexual activity: Yes    Partners: Male    Birth control/protection: Post-menopausal  Lifestyle  . Physical activity:    Days per week: Not on file    Minutes per session: Not on file  . Stress: Not on file  Relationships  . Social connections:    Talks on phone: Not on file    Gets together: Not on file    Attends religious service: Not on file    Active member of club or organization: Not on file    Attends meetings of clubs or organizations: Not on file    Relationship status: Not on file  . Intimate partner violence:    Fear of current or ex partner: Not on file    Emotionally abused: Not on file    Physically abused: Not on file    Forced sexual activity: Not on file  Other Topics Concern  . Not on file  Social History Narrative   Divorced.   Lives alone.   Adult children live in town.  Her parents are in [place]    Patient is right handed   Education level is some college   Caffeine consumption is a couple of cups a day             Review of systems: Review of Systems  Constitutional: Negative for fever and chills.  HENT: Negative.   Eyes: Negative for blurred vision.  Respiratory: Negative for cough, shortness of breath and wheezing.   Cardiovascular: Negative for chest pain and palpitations.  Gastrointestinal: as per HPI Genitourinary: Negative for dysuria,  frequency and hematuria. Positive for urinary urgency Musculoskeletal: Negative for myalgias, back pain and positive for joint pain.  Skin: Negative for itching and rash.  Neurological: Negative for dizziness, tremors, focal weakness, seizures and loss of consciousness. Problems with walking and balance Endo/Heme/Allergies: Positive for seasonal allergies.  Psychiatric/Behavioral: Negative for depression, suicidal ideas and  hallucinations.  All other systems reviewed and are negative.   Physical Exam: Vitals:   08/11/18 0840  BP: 100/72  Pulse: 60   Body mass index is 21.74 kg/m. Gen:      No acute distress HEENT:  EOMI, sclera anicteric Neck:     No masses; no thyromegaly Lungs:    Clear to auscultation bilaterally; normal respiratory effort CV:         Regular rate and rhythm; no murmurs Abd:      + bowel sounds; soft, non-tender; no palpable masses, no distension Ext:    No edema; adequate peripheral perfusion Skin:      Warm and dry; no rash  Neuro: alert and oriented x 3 Psych: normal mood and affect  Data Reviewed:  Reviewed labs, radiology imaging, old records and pertinent past GI work up   Assessment and Plan/Recommendations:  67 year old female with history of GERD, intermittent solid dysphagia and epigastric abdominal discomfort History of chronic NSAID use Schedule for EGD to exclude severe gastritis, peptic ulcer disease Also evaluate for possible Schatzki's ring or peptic stricture in the distal esophagus with possible esophageal dilation She mostly has nocturnal GERD symptoms, start Zantac 150 milligrams at bedtime as needed Discussed antireflux measures and lifestyle modifications in detail Advised patient to avoid NSAIDs  The risks and benefits as well as alternatives of endoscopic procedure(s) have been discussed and reviewed. All questions answered. The patient agrees to proceed.    Damaris Hippo , MD [phone]    CC: Charlott Rakes, MD

## 2018-08-12 ENCOUNTER — Ambulatory Visit (AMBULATORY_SURGERY_CENTER): Payer: PPO | Admitting: Gastroenterology

## 2018-08-12 ENCOUNTER — Encounter: Payer: Self-pay | Admitting: Gastroenterology

## 2018-08-12 VITALS — BP 132/73 | HR 59 | Temp 98.0°F | Resp 17 | Ht 68.0 in | Wt 143.0 lb

## 2018-08-12 DIAGNOSIS — K3189 Other diseases of stomach and duodenum: Secondary | ICD-10-CM | POA: Diagnosis not present

## 2018-08-12 DIAGNOSIS — K219 Gastro-esophageal reflux disease without esophagitis: Secondary | ICD-10-CM

## 2018-08-12 DIAGNOSIS — R1310 Dysphagia, unspecified: Secondary | ICD-10-CM | POA: Diagnosis not present

## 2018-08-12 DIAGNOSIS — K2970 Gastritis, unspecified, without bleeding: Secondary | ICD-10-CM

## 2018-08-12 MED ORDER — SODIUM CHLORIDE 0.9 % IV SOLN: 500.0000 mL | Freq: Once | INTRAVENOUS | Status: DC

## 2018-08-12 NOTE — Op Note (Signed)
[facility] Patient Name: Gail Gilbert Procedure Date: 08/12/2018 11:44 AM MRN: [id number] Endoscopist: Mauri Pole , MD Age: 67 Referring MD:  Date of Birth: 1951-11-09 Gender: Female Account #: [id number] Procedure:                Upper GI endoscopy Indications:              Dysphagia Medicines:                Monitored Anesthesia Care Procedure:                Pre-Anesthesia Assessment:                           - Prior to the procedure, a History and Physical                            was performed, and patient medications and                            allergies were reviewed. The patient's tolerance of                            previous anesthesia was also reviewed. The risks                            and benefits of the procedure and the sedation                            options and risks were discussed with the patient.                            All questions were answered, and informed consent                            was obtained. Prior Anticoagulants: The patient has                            taken no previous anticoagulant or antiplatelet                            agents. ASA Grade Assessment: II - A patient with                            mild systemic disease. After reviewing the risks                            and benefits, the patient was deemed in                            satisfactory condition to undergo the procedure.                           After obtaining informed consent, the endoscope was  passed under direct vision. Throughout the                            procedure, the patient's blood pressure, pulse, and                            oxygen saturations were monitored continuously. The                            Endoscope was introduced through the mouth, and                            advanced to the second part of duodenum. The upper                            GI endoscopy was accomplished without  difficulty.                            The patient tolerated the procedure well. Scope In: Scope Out: Findings:                 The Z-line was regular and was found 37 cm from the                            incisors.                           No endoscopic abnormality was evident in the                            esophagus to explain the patient's complaint of                            dysphagia.                           Patchy moderate inflammation with hemorrhage                            characterized by congestion (edema), erosions,                            erythema and friability was found in the cardia.                            Biopsies were taken with a cold forceps for                            histology. Biopsies were taken with a cold forceps                            for Helicobacter pylori testing.                           The examined duodenum was normal. Complications:  No immediate complications. Estimated Blood Loss:     Estimated blood loss was minimal. Impression:               - Z-line regular, 37 cm from the incisors.                           - No endoscopic esophageal abnormality to explain                            patient's dysphagia.                           - Gastritis with hemorrhage. Biopsied.                           - Normal examined duodenum. Recommendation:           - Patient has a contact number available for                            emergencies. The signs and symptoms of potential                            delayed complications were discussed with the                            patient. Return to normal activities tomorrow.                            Written discharge instructions were provided to the                            patient.                           - Resume previous diet.                           - Continue present medications.                           - Await pathology results.                           - No  aspirin, ibuprofen, naproxen, or other                            non-steroidal anti-inflammatory drugs. Mauri Pole, MD 08/12/2018 12:10:27 PM This report has been signed electronically.

## 2018-08-12 NOTE — Progress Notes (Signed)
Called to room to assist during endoscopic procedure.  Patient ID and intended procedure confirmed with present staff. Received instructions for my participation in the procedure from the performing physician.  

## 2018-08-12 NOTE — Progress Notes (Signed)
Report to PACU, RN, vss, BBS= Clear.  

## 2018-08-12 NOTE — Patient Instructions (Signed)
YOU HAD AN ENDOSCOPIC PROCEDURE TODAY AT [facility]:   Refer to the procedure report that was given to you for any specific questions about what was found during the examination.  If the procedure report does not answer your questions, please call your gastroenterologist to clarify.  If you requested that your care partner not be given the details of your procedure findings, then the procedure report has been included in a sealed envelope for you to review at your convenience later.  YOU SHOULD EXPECT: Some feelings of bloating in the abdomen. Passage of more gas than usual.  Walking can help get rid of the air that was put into your GI tract during the procedure and reduce the bloating. If you had a lower endoscopy (such as a colonoscopy or flexible sigmoidoscopy) you may notice spotting of blood in your stool or on the toilet paper. If you underwent a bowel prep for your procedure, you may not have a normal bowel movement for a few days.  Please Note:  You might notice some irritation and congestion in your nose or some drainage.  This is from the oxygen used during your procedure.  There is no need for concern and it should clear up in a day or so.  SYMPTOMS TO REPORT IMMEDIATELY:    Following upper endoscopy (EGD)  Vomiting of blood or coffee ground material  New chest pain or pain under the shoulder blades  Painful or persistently difficult swallowing  New shortness of breath  Fever of 100F or higher  Black, tarry-looking stools  For urgent or emergent issues, a gastroenterologist can be reached at any hour by calling [phone].   DIET:  We do recommend a small meal at first, but then you may proceed to your regular diet.  Drink plenty of fluids but you should avoid alcoholic beverages for 24 hours.  MEDICATIONS: Continue present medications. No Aspirin, Ibuprofen, Naproxen, or other non-steroidal anti-inflammatory drugs.  Please see handouts given to you by your  recovery nurse.  ACTIVITY:  You should plan to take it easy for the rest of today and you should NOT DRIVE or use heavy machinery until tomorrow (because of the sedation medicines used during the test).    FOLLOW UP: Our staff will call the number listed on your records the next business day following your procedure to check on you and address any questions or concerns that you may have regarding the information given to you following your procedure. If we do not reach you, we will leave a message.  However, if you are feeling well and you are not experiencing any problems, there is no need to return our call.  We will assume that you have returned to your regular daily activities without incident.  If any biopsies were taken you will be contacted by phone or by letter within the next 1-3 weeks.  Please call us at [phone] if you have not heard about the biopsies in 3 weeks.    SIGNATURES/CONFIDENTIALITY: You and/or your care partner have signed paperwork which will be entered into your electronic medical record.  These signatures attest to the fact that that the information above on your After Visit Summary has been reviewed and is understood.  Full responsibility of the confidentiality of this discharge information lies with you and/or your care-partner.

## 2018-08-13 ENCOUNTER — Encounter: Payer: Self-pay | Admitting: Gastroenterology

## 2018-08-17 ENCOUNTER — Telehealth: Payer: Self-pay | Admitting: *Deleted

## 2018-08-17 ENCOUNTER — Telehealth: Payer: Self-pay

## 2018-08-17 NOTE — Telephone Encounter (Signed)
Pt returned call states she is doing fine.

## 2018-08-17 NOTE — Telephone Encounter (Signed)
  Follow up Call-  Call back number 08/12/2018 11/26/2015  Post procedure Call Back phone  # [phone]  Permission to leave phone message No Yes  Some recent data might be hidden     Patient questions:  Do you have a fever, pain , or abdominal swelling? No. Pain Score  0 *  Have you tolerated food without any problems? Yes.    Have you been able to return to your normal activities? Yes.    Do you have any questions about your discharge instructions: Diet   No. Medications  No. Follow up visit  No.  Do you have questions or concerns about your Care? No.  Actions: * If pain score is 4 or above: No action needed, pain <4.

## 2018-08-17 NOTE — Telephone Encounter (Signed)
Unable to leave message per patient request.

## 2018-08-18 ENCOUNTER — Encounter: Payer: Self-pay | Admitting: Gastroenterology

## 2018-08-19 ENCOUNTER — Encounter: Payer: Self-pay | Admitting: Gastroenterology

## 2018-08-23 ENCOUNTER — Other Ambulatory Visit: Payer: Self-pay | Admitting: Family Medicine

## 2018-09-15 ENCOUNTER — Other Ambulatory Visit: Payer: Self-pay | Admitting: Family Medicine

## 2018-09-15 DIAGNOSIS — F32A Depression, unspecified: Secondary | ICD-10-CM

## 2018-09-15 DIAGNOSIS — F329 Major depressive disorder, single episode, unspecified: Secondary | ICD-10-CM

## 2018-09-15 DIAGNOSIS — F419 Anxiety disorder, unspecified: Principal | ICD-10-CM

## 2018-09-16 ENCOUNTER — Other Ambulatory Visit: Payer: Self-pay | Admitting: Family Medicine

## 2018-09-16 DIAGNOSIS — F419 Anxiety disorder, unspecified: Principal | ICD-10-CM

## 2018-09-16 DIAGNOSIS — F329 Major depressive disorder, single episode, unspecified: Secondary | ICD-10-CM

## 2018-09-19 ENCOUNTER — Other Ambulatory Visit: Payer: Self-pay | Admitting: Internal Medicine

## 2018-09-29 ENCOUNTER — Ambulatory Visit: Payer: PPO | Admitting: Internal Medicine

## 2018-09-29 ENCOUNTER — Ambulatory Visit (HOSPITAL_BASED_OUTPATIENT_CLINIC_OR_DEPARTMENT_OTHER): Payer: PPO | Admitting: Physician Assistant

## 2018-09-29 ENCOUNTER — Ambulatory Visit (HOSPITAL_COMMUNITY)
Admission: RE | Admit: 2018-09-29 | Discharge: 2018-09-29 | Disposition: A | Discharge status: Home / Self Care | Payer: PPO | Source: Ambulatory Visit | Attending: Physician Assistant | Admitting: Physician Assistant

## 2018-09-29 VITALS — BP 102/66 | HR 75 | Temp 97.8°F | Ht 68.0 in | Wt 146.0 lb

## 2018-09-29 DIAGNOSIS — F329 Major depressive disorder, single episode, unspecified: Secondary | ICD-10-CM | POA: Insufficient documentation

## 2018-09-29 DIAGNOSIS — K589 Irritable bowel syndrome without diarrhea: Secondary | ICD-10-CM | POA: Insufficient documentation

## 2018-09-29 DIAGNOSIS — S99912A Unspecified injury of left ankle, initial encounter: Secondary | ICD-10-CM | POA: Diagnosis not present

## 2018-09-29 DIAGNOSIS — S93402A Sprain of unspecified ligament of left ankle, initial encounter: Secondary | ICD-10-CM | POA: Insufficient documentation

## 2018-09-29 DIAGNOSIS — X58XXXA Exposure to other specified factors, initial encounter: Secondary | ICD-10-CM | POA: Diagnosis not present

## 2018-09-29 DIAGNOSIS — G0491 Myelitis, unspecified: Secondary | ICD-10-CM

## 2018-09-29 DIAGNOSIS — Z79899 Other long term (current) drug therapy: Secondary | ICD-10-CM | POA: Insufficient documentation

## 2018-09-29 DIAGNOSIS — F419 Anxiety disorder, unspecified: Secondary | ICD-10-CM

## 2018-09-29 DIAGNOSIS — M25572 Pain in left ankle and joints of left foot: Secondary | ICD-10-CM | POA: Diagnosis not present

## 2018-09-29 DIAGNOSIS — G2581 Restless legs syndrome: Secondary | ICD-10-CM | POA: Insufficient documentation

## 2018-09-29 DIAGNOSIS — M1990 Unspecified osteoarthritis, unspecified site: Secondary | ICD-10-CM | POA: Insufficient documentation

## 2018-09-29 DIAGNOSIS — K219 Gastro-esophageal reflux disease without esophagitis: Secondary | ICD-10-CM | POA: Insufficient documentation

## 2018-09-29 MED ORDER — SERTRALINE HCL 25 MG PO TABS: 25.0000 mg | ORAL_TABLET | Freq: Every day | ORAL | 1 refills | Status: DC

## 2018-09-29 MED ORDER — TRAZODONE HCL 50 MG PO TABS: 50.0000 mg | ORAL_TABLET | Freq: Every day | ORAL | 1 refills | Status: DC

## 2018-09-29 MED ORDER — GABAPENTIN 300 MG PO CAPS: ORAL_CAPSULE | ORAL | 1 refills | Status: DC

## 2018-09-29 NOTE — Progress Notes (Signed)
Patient will like to get left ankle looked at.

## 2018-09-29 NOTE — Progress Notes (Signed)
Patient ID: Gail Gilbert, female   DOB: [birth year], 67 y.o.   MRN: [id number]   Gail Gilbert, is a 67 y.o. female  KYH:[id number]  [id number]:[id number]  DOB - 09-Jan-1951  Subjective:  Chief Complaint and HPI: Gail Gilbert is a 67 y.o. female here today for L ankle sprain about 4 months ago when she tripped over a blanket getting out of bed.  Still mild swelling but not painful and ROM is good.    Also, needs RF on meds.  Stable on all meds.  No depression/anxiety.  Sleeping well on trazadone.    ROS:   Constitutional:  No f/c, No night sweats, No unexplained weight loss. EENT:  No vision changes, No blurry vision, No hearing changes. No mouth, throat, or ear problems.  Respiratory: No cough, No SOB Cardiac: No CP, no palpitations GI:  No abd pain, No N/V/D. GU: No Urinary s/sx Musculoskeletal: No joint pain Neuro: No headache, no dizziness, no motor weakness.  Skin: No rash Endocrine:  No polydipsia. No polyuria.  Psych: Denies SI/HI  No problems updated.  ALLERGIES: Allergies  Allergen Reactions  . Antihistamines, Diphenhydramine-Type Palpitations  . Omeprazole     Joint pain, flu-like symptoms  . Pseudoephedrine Hcl Other (See Comments)    PAST MEDICAL HISTORY: Past Medical History:  Diagnosis Date  . Cataract, immature   . Dental crowns present   . GERD (gastroesophageal reflux disease)   . Hammer toe of right foot 07/2016   2nd and 3rd toes  . IBS (irritable bowel syndrome)    no current problems  . Idiopathic transverse myelitis ([place]) 2014   residual numbness feet and right small finger  . Osteoarthritis    cervical spine, lumbar spine  . RLS (restless legs syndrome)    no current med.    MEDICATIONS AT HOME: Prior to Admission medications   Medication Sig Start Date End Date Taking? Authorizing Provider  gabapentin (NEURONTIN) 300 MG capsule TAKE 1 CAPSULE(300 MG) BY MOUTH TWICE DAILY 09/29/18  Yes Kristan Brummitt M, PA-C  Multiple Vitamins-Minerals  (MULTIVITAMIN & MINERAL PO) Take 1 tablet by mouth daily.   Yes [provider]  sertraline (ZOLOFT) 25 MG tablet Take 1 tablet (25 mg total) by mouth daily. MUST MAKE APPT FOR FURTHER REFILLS 09/29/18  Yes Argentina Donovan, PA-C  traZODone (DESYREL) 50 MG tablet Take 1 tablet (50 mg total) by mouth at bedtime. MUST MAKE APPT FOR FURTHER REFILL 09/29/18  Yes Argentina Donovan, PA-C  ranitidine (ZANTAC) 150 MG tablet Take 1 tablet (150 mg total) by mouth at bedtime. Patient not taking: Reported on 08/12/2018 08/11/18   Mauri Pole, MD     Objective:  EXAM:   Vitals:   09/29/18 0955  BP: 102/66  Pulse: 75  Temp: 97.8 F (36.6 C)  TempSrc: Oral  Weight: 146 lb (66.2 kg)  Height: 5\' 8"  (1.727 m)    General appearance : A&OX3. NAD. Non-toxic-appearing HEENT: Atraumatic and Normocephalic.  PERRLA. EOM intact.  Neck: supple, no JVD. No cervical lymphadenopathy. No thyromegaly Chest/Lungs:  Breathing-non-labored, Good air entry bilaterally, breath sounds normal without rales, rhonchi, or wheezing  CVS: S1 S2 regular, no murmurs, gallops, rubs  Extremities: Bilateral Lower Ext shows no edema, both legs are warm to touch with = pulse throughout.  L ankle examined-full S&ROM.  No point TTP.  There is mild swelling over the L lateral malleolus compared to R Neurology:  CN II-XII grossly intact, Non focal.   Psych:  TP linear. J/I WNL. Normal speech. Appropriate eye contact and affect.  Skin:  No Rash  Data Review Lab Results  Component Value Date   HGBA1C 5.40 09/17/2015     Assessment & Plan   1. Anxiety and depression stable - sertraline (ZOLOFT) 25 MG tablet; Take 1 tablet (25 mg total) by mouth daily.  Dispense: 90 tablet; Refill: 1 - traZODone (DESYREL) 50 MG tablet; Take 1 tablet (50 mg total) by mouth at bedtime. MUST MAKE APPT FOR FURTHER REFILL  Dispense: 90 tablet; Refill: 1  2. Myelitis ([place]) stable - gabapentin (NEURONTIN) 300 MG capsule; TAKE 1  CAPSULE(300 MG) BY MOUTH TWICE DAILY  Dispense: 180 capsule; Refill: 1  3. Sprain of left ankle, unspecified ligament, initial encounter - DG Ankle Complete Left; Future  Patient have been counseled extensively about nutrition and exercise  F/up 6 months The patient was given clear instructions to go to ER or return to medical center if symptoms don't improve, worsen or new problems develop. The patient verbalized understanding. The patient was told to call to get lab results if they haven't heard anything in the next week.     [name], PA-C [facility] [place], [place]   09/29/2018, 10:09 AM

## 2018-10-01 ENCOUNTER — Telehealth: Payer: Self-pay

## 2018-10-01 NOTE — Telephone Encounter (Signed)
Spoke to pt regarding her X ray report and the only thing noted was an old fx.  Pt said she had a fall in June. She was glad it is not new.  Fall precautions explained and increased awareness stressed.  Pt did engage in discussing safety.

## 2018-10-25 ENCOUNTER — Other Ambulatory Visit: Payer: Self-pay | Admitting: Family Medicine

## 2018-12-30 ENCOUNTER — Other Ambulatory Visit: Payer: Self-pay

## 2018-12-30 ENCOUNTER — Telehealth: Payer: Self-pay | Admitting: Gastroenterology

## 2018-12-30 MED ORDER — FAMOTIDINE 20 MG PO TABS: 20.0000 mg | ORAL_TABLET | Freq: Every day | ORAL | 3 refills | Status: DC

## 2018-12-30 NOTE — Telephone Encounter (Signed)
Pt called in and she advised that the ranitidine (ZANTAC) 150 MG tablet [[id number]]  Is making her sick and hurting her stomach. She is very concern and would like to know if she needs to continue to take or can something else be prescribe.

## 2018-12-30 NOTE — Telephone Encounter (Signed)
Spoke with the patient. She wants to come off of the Zantac. She has also noticed that it does not work as well as it did at first.  Change to Pepcid 20 mg. Patient agrees to call us in one week with an update.

## 2019-01-03 NOTE — Telephone Encounter (Signed)
ok 

## 2019-01-20 ENCOUNTER — Encounter: Payer: Self-pay | Admitting: Podiatry

## 2019-01-20 ENCOUNTER — Ambulatory Visit (INDEPENDENT_AMBULATORY_CARE_PROVIDER_SITE_OTHER): Payer: PPO

## 2019-01-20 ENCOUNTER — Other Ambulatory Visit: Payer: Self-pay | Admitting: Podiatry

## 2019-01-20 ENCOUNTER — Ambulatory Visit: Payer: PPO | Admitting: Podiatry

## 2019-01-20 VITALS — BP 97/72 | HR 86 | Resp 16

## 2019-01-20 DIAGNOSIS — M2011 Hallux valgus (acquired), right foot: Secondary | ICD-10-CM

## 2019-01-20 DIAGNOSIS — M216X1 Other acquired deformities of right foot: Secondary | ICD-10-CM

## 2019-01-20 DIAGNOSIS — M779 Enthesopathy, unspecified: Secondary | ICD-10-CM

## 2019-01-20 DIAGNOSIS — T847XXA Infection and inflammatory reaction due to other internal orthopedic prosthetic devices, implants and grafts, initial encounter: Secondary | ICD-10-CM

## 2019-01-20 DIAGNOSIS — M778 Other enthesopathies, not elsewhere classified: Secondary | ICD-10-CM

## 2019-01-20 DIAGNOSIS — M2041 Other hammer toe(s) (acquired), right foot: Secondary | ICD-10-CM

## 2019-01-20 NOTE — Progress Notes (Signed)
Subjective:  Patient ID: Gail Gilbert, female    DOB: [birth year],  MRN: [id number] HPI Chief Complaint  Patient presents with  . Foot Pain    1st MPJ rihgt - previous bunion surgery, noticing aching more frequently now  . New Patient (Initial Visit)    Est pt 2016    68 y.o. female presents with the above complaint.   ROS: Denies fever chills nausea vomiting muscle aches pains calf pain back pain chest pain shortness of breath.  Past Medical History:  Diagnosis Date  . Cataract, immature   . Dental crowns present   . GERD (gastroesophageal reflux disease)   . Hammer toe of right foot 07/2016   2nd and 3rd toes  . IBS (irritable bowel syndrome)    no current problems  . Idiopathic transverse myelitis ([facility]) 2014   residual numbness feet and right small finger  . Osteoarthritis    cervical spine, lumbar spine  . RLS (restless legs syndrome)    no current med.   Past Surgical History:  Procedure Laterality Date  . BUNIONECTOMY Bilateral   . COLONOSCOPY    . COLONOSCOPY WITH PROPOFOL  11/26/2015  . HAMMER TOE SURGERY  10/29/2011   Procedure: HAMMER TOE CORRECTION;  Surgeon: Kerin Salen;  Location: [facility];  Service: Orthopedics;  Laterality: Left;  Left 2nd toe duvries arthroplasty  . HAMMER TOE SURGERY Right 07/23/2016   Procedure: [place];  Surgeon: Frederik Pear, MD;  Location: [facility];  Service: Orthopedics;  Laterality: Right;  . MIDDLE EAR SURGERY     to relieve ringing in ear  . POLYPECTOMY    . SYNOVIAL CYST EXCISION     lumbar back    Current Outpatient Medications:  .  acetaminophen (TYLENOL) 500 MG tablet, Take 500 mg by mouth every 6 (six) hours as needed., Disp: , Rfl:  .  gabapentin (NEURONTIN) 300 MG capsule, TAKE 1 CAPSULE(300 MG) BY MOUTH TWICE DAILY, Disp: 180 capsule, Rfl: 1 .  Multiple Vitamins-Minerals (MULTIVITAMIN & MINERAL PO), Take 1 tablet by mouth daily., Disp: , Rfl:  .   sertraline (ZOLOFT) 25 MG tablet, Take 1 tablet (25 mg total) by mouth daily. MUST MAKE APPT FOR FURTHER REFILLS, Disp: 90 tablet, Rfl: 1 .  traZODone (DESYREL) 50 MG tablet, Take 1 tablet (50 mg total) by mouth at bedtime. MUST MAKE APPT FOR FURTHER REFILL, Disp: 90 tablet, Rfl: 1  Allergies  Allergen Reactions  . Antihistamines, Diphenhydramine-Type Palpitations  . Omeprazole     Joint pain, flu-like symptoms  . Pseudoephedrine Hcl Other (See Comments)   Review of Systems Objective:   Vitals:   01/20/19 0843  BP: 97/72  Pulse: 86  Resp: 16    General: Well developed, nourished, in no acute distress, alert and oriented x3   Dermatological: Skin is warm, dry and supple bilateral. Nails x 10 are well maintained; remaining integument appears unremarkable at this time. There are no open sores, no preulcerative lesions, no rash or signs of infection present.  Vascular: Dorsalis Pedis artery and Posterior Tibial artery pedal pulses are 2/4 bilateral with immedate capillary fill time. Pedal hair growth present. No varicosities and no lower extremity edema present bilateral.   Neruologic: Grossly intact via light touch bilateral. Vibratory intact via tuning fork bilateral. Protective threshold with Semmes Wienstein monofilament intact to all pedal sites bilateral. Patellar and Achilles deep tendon reflexes 2+ bilateral. No Babinski or clonus noted bilateral.   Musculoskeletal:  No gross boney pedal deformities bilateral. No pain, crepitus, or limitation noted with foot and ankle range of motion bilateral. Muscular strength 5/5 in all groups tested bilateral.  Painful range of motion of the first metatarsal phalangeal joint of the right foot.  Osteoarthritic changes PIPJ's #2 #3 #4 of the right foot as well as the second MTPJ.  Gait: Unassisted, Nonantalgic.    Radiographs:  Radiographs taken today demonstrate an increase in the first intermetatarsal angle and hallux abductus angle with  retained screw and K wire to the first metatarsal from her previous bunion surgery.  An elongated second metatarsal is also noted with significant osteoarthritic change.  Hammertoe deformities flexible in nature with some osteoarthritic changes PIPJ studies #2 #3 #4 of the right foot.  Assessment & Plan:   Assessment: Painful internal fixation first metatarsal of the right foot.  Osteoarthritis second third and fourth toes of the right foot.  Bunion deformity right foot.  Painful elongated second metatarsal right foot.  Plan: Discussed appropriate shoe gear stretching exercise ice therapy shoe modifications at this point we consented her for surgical intervention.  We consented her for removal of painful internal fixation as well as an Liane Comber type osteotomy with fixation of the screw.  She understands and is amenable to it.  We also discussed second metatarsal osteotomy with double screw fixation and hammertoe repair #2 #3 #4 up with wires or screws.  She understands this and is amenable to it.  We did discuss possible postop complications which may include but not limited to postop pain bleeding swelling infection recurrence need for further surgery overcorrection under correction also digit loss of limb loss of life.  We dispensed information regarding the surgery center anesthesia group as well as instructions for the morning of preop.  We also provided her with a cam walker and I will follow-up with her in the near future.      T. [place], [place]

## 2019-01-20 NOTE — Patient Instructions (Signed)
Pre-Operative Instructions  Congratulations, you have decided to take an important step towards improving your quality of life.  You can be assured that the doctors and staff at [facility] will be with you every step of the way.  Here are some important things you should know:  1. Plan to be at the surgery center/hospital at least 1 (one) hour prior to your scheduled time, unless otherwise directed by the surgical center/hospital staff.  You must have a responsible adult accompany you, remain during the surgery and drive you home.  Make sure you have directions to the surgical center/hospital to ensure you arrive on time. 2. If you are having surgery at [facility] or [facility], you will need a copy of your medical history and physical form from your family physician within one month prior to the date of surgery. We will give you a form for your primary physician to complete.  3. We make every effort to accommodate the date you request for surgery.  However, there are times where surgery dates or times have to be moved.  We will contact you as soon as possible if a change in schedule is required.   4. No aspirin/ibuprofen for one week before surgery.  If you are on aspirin, any non-steroidal anti-inflammatory medications (Mobic, Aleve, Ibuprofen) should not be taken seven (7) days prior to your surgery.  You make take Tylenol for pain prior to surgery.  5. Medications - If you are taking daily heart and blood pressure medications, seizure, reflux, allergy, asthma, anxiety, pain or diabetes medications, make sure you notify the surgery center/hospital before the day of surgery so they can tell you which medications you should take or avoid the day of surgery. 6. No food or drink after midnight the night before surgery unless directed otherwise by surgical center/hospital staff. 7. No alcoholic beverages 24-hours prior to surgery.  No smoking 24-hours prior or 24-hours after  surgery. 8. Wear loose pants or shorts. They should be loose enough to fit over bandages, boots, and casts. 9. Don't wear slip-on shoes. Sneakers are preferred. 10. Bring your boot with you to the surgery center/hospital.  Also bring crutches or a walker if your physician has prescribed it for you.  If you do not have this equipment, it will be provided for you after surgery. 11. If you have not been contacted by the surgery center/hospital by the day before your surgery, call to confirm the date and time of your surgery. 12. Leave-time from work may vary depending on the type of surgery you have.  Appropriate arrangements should be made prior to surgery with your employer. 13. Prescriptions will be provided immediately following surgery by your doctor.  Fill these as soon as possible after surgery and take the medication as directed. Pain medications will not be refilled on weekends and must be approved by the doctor. 14. Remove nail polish on the operative foot and avoid getting pedicures prior to surgery. 15. Wash the night before surgery.  The night before surgery wash the foot and leg well with water and the antibacterial soap provided. Be sure to pay special attention to beneath the toenails and in between the toes.  Wash for at least three (3) minutes. Rinse thoroughly with water and dry well with a towel.  Perform this wash unless told not to do so by your physician.  Enclosed: 1 Ice pack (please put in freezer the night before surgery)   1 Hibiclens skin cleaner     Pre-op instructions  If you have any questions regarding the instructions, please do not hesitate to call our office.  [place]: [address], [place], [place] [address] -- [phone]  [place]: [address] [place], [place] [address] -- [phone]  [place]: [address]  [place], [place] [address] -- [phone]  [place]: [address], [address], [place], [place] [address] -- [phone]  Website:  [email or web address] 

## 2019-01-27 ENCOUNTER — Telehealth: Payer: Self-pay | Admitting: *Deleted

## 2019-01-27 NOTE — Telephone Encounter (Signed)
I am calling you in regards to your surgery date of February 11, 2019.  Dr. Milinda Pointer is not doing surgeries on that date.  Whomever gave you that date was not paying attention to his schedule.  I apologize for that.  So, we need to reschedule your surgery.  "That's not a problem."  He can do it on February 21 or March 6.  "Let's schedule it for March 6, that actually might work out better."  I'll get it scheduled. Thank you so much.

## 2019-02-16 ENCOUNTER — Other Ambulatory Visit: Payer: Self-pay | Admitting: Podiatry

## 2019-02-16 MED ORDER — ONDANSETRON HCL 4 MG PO TABS: 4.0000 mg | ORAL_TABLET | Freq: Three times a day (TID) | ORAL | 0 refills | Status: DC | PRN

## 2019-02-16 MED ORDER — OXYCODONE-ACETAMINOPHEN 10-325 MG PO TABS: 1.0000 | ORAL_TABLET | Freq: Four times a day (QID) | ORAL | 0 refills | Status: AC | PRN

## 2019-02-16 MED ORDER — CEPHALEXIN 500 MG PO CAPS: 500.0000 mg | ORAL_CAPSULE | Freq: Three times a day (TID) | ORAL | 0 refills | Status: DC

## 2019-02-17 ENCOUNTER — Telehealth: Payer: Self-pay | Admitting: *Deleted

## 2019-02-17 NOTE — Telephone Encounter (Signed)
Pt called states she has balance issues and was concerned with the boot causing a limb length difference. I told pt to wear a shoe with the equal sole shoe. Pt states she would do that. I told pt I could get her a walker to help with balance. Pt states she will think about it and call again.

## 2019-02-17 NOTE — Telephone Encounter (Signed)
Left message informing pt that if her question was did she need to bring the boot to the surgery tomorrow, then she should take the boot to the surgery it would be part of her post op dressing, if she had another question to call again.

## 2019-02-17 NOTE — Telephone Encounter (Signed)
Pt states she is having surgery tomorrow and has an question concerning the boot.

## 2019-02-18 ENCOUNTER — Encounter: Payer: Self-pay | Admitting: Podiatry

## 2019-02-18 DIAGNOSIS — M25571 Pain in right ankle and joints of right foot: Secondary | ICD-10-CM | POA: Diagnosis not present

## 2019-02-18 DIAGNOSIS — E7800 Pure hypercholesterolemia, unspecified: Secondary | ICD-10-CM | POA: Diagnosis not present

## 2019-02-18 DIAGNOSIS — M2041 Other hammer toe(s) (acquired), right foot: Secondary | ICD-10-CM | POA: Diagnosis not present

## 2019-02-18 DIAGNOSIS — M21541 Acquired clubfoot, right foot: Secondary | ICD-10-CM | POA: Diagnosis not present

## 2019-02-18 DIAGNOSIS — M7751 Other enthesopathy of right foot: Secondary | ICD-10-CM | POA: Diagnosis not present

## 2019-02-18 DIAGNOSIS — M216X1 Other acquired deformities of right foot: Secondary | ICD-10-CM | POA: Diagnosis not present

## 2019-02-18 DIAGNOSIS — M2011 Hallux valgus (acquired), right foot: Secondary | ICD-10-CM | POA: Diagnosis not present

## 2019-02-18 DIAGNOSIS — M21611 Bunion of right foot: Secondary | ICD-10-CM | POA: Diagnosis not present

## 2019-02-18 DIAGNOSIS — T8484XA Pain due to internal orthopedic prosthetic devices, implants and grafts, initial encounter: Secondary | ICD-10-CM | POA: Diagnosis not present

## 2019-02-24 ENCOUNTER — Encounter: Payer: Self-pay | Admitting: Podiatry

## 2019-02-24 ENCOUNTER — Ambulatory Visit (INDEPENDENT_AMBULATORY_CARE_PROVIDER_SITE_OTHER): Payer: PPO | Admitting: Podiatry

## 2019-02-24 ENCOUNTER — Other Ambulatory Visit: Payer: Self-pay

## 2019-02-24 VITALS — BP 106/73 | HR 78 | Temp 97.7°F | Resp 16

## 2019-02-24 DIAGNOSIS — Z9889 Other specified postprocedural states: Secondary | ICD-10-CM

## 2019-02-24 DIAGNOSIS — T847XXD Infection and inflammatory reaction due to other internal orthopedic prosthetic devices, implants and grafts, subsequent encounter: Secondary | ICD-10-CM

## 2019-02-24 DIAGNOSIS — M2011 Hallux valgus (acquired), right foot: Secondary | ICD-10-CM

## 2019-02-24 DIAGNOSIS — M2041 Other hammer toe(s) (acquired), right foot: Secondary | ICD-10-CM

## 2019-02-24 DIAGNOSIS — M216X1 Other acquired deformities of right foot: Secondary | ICD-10-CM

## 2019-02-24 NOTE — Progress Notes (Signed)
Presents today for first postop visit date of surgery February 18, 2019 status post Liane Comber bunionectomy right metatarsal osteotomy second right hammertoe repair second third fourth right foot.  She states that she had some initial soreness but now is doing okay she denies fever chills nausea vomiting muscle aches pains calf pain back pain chest pain and shortness of breath.  Objective: Vital signs are stable alert and oriented x3 dry sterile dressing intact have consider amount of blood in the dressing was removed.  This was overlying the second metatarsal phalangeal joint area.  She has good range of motion of the first metatarsal phalangeal joint sutures are in place margins are well coapted  Radiographs were not able to be taken today secondary to computer failure.  Assessment: Well-healed surgical foot right.  Plan: At this point I redressed the foot today dressed a compressive dressing placed her back in her Cam walker and she will keep this dry clean and elevated for me.  I will follow-up with her in approximately 1 week for possible suture removal.  X-rays will need to be performed next time for confirmation.

## 2019-03-03 ENCOUNTER — Ambulatory Visit (INDEPENDENT_AMBULATORY_CARE_PROVIDER_SITE_OTHER): Payer: Self-pay | Admitting: Podiatry

## 2019-03-03 ENCOUNTER — Other Ambulatory Visit: Payer: Self-pay

## 2019-03-03 ENCOUNTER — Ambulatory Visit (INDEPENDENT_AMBULATORY_CARE_PROVIDER_SITE_OTHER): Payer: PPO

## 2019-03-03 DIAGNOSIS — M2011 Hallux valgus (acquired), right foot: Secondary | ICD-10-CM | POA: Diagnosis not present

## 2019-03-03 DIAGNOSIS — M216X1 Other acquired deformities of right foot: Secondary | ICD-10-CM

## 2019-03-03 DIAGNOSIS — M2041 Other hammer toe(s) (acquired), right foot: Secondary | ICD-10-CM | POA: Diagnosis not present

## 2019-03-03 DIAGNOSIS — Z09 Encounter for follow-up examination after completed treatment for conditions other than malignant neoplasm: Secondary | ICD-10-CM

## 2019-03-03 NOTE — Progress Notes (Signed)
She presents today date of surgery 02/18/2019 status post Gail Gilbert bunionectomy right metatarsal osteotomy #2 right hammertoe repair #2 #3 #4 right states that my foot feels better this week than it did last week I have not needed any pain meds.  Objective: Vital signs are stable alert and oriented x3.  Pulses are palpable.  Neurologic storm is intact.  Sutures are intact margins well coapted sutures removed today margins remain well coapted except for suture along the second toe and second metatarsal phalangeal joint.  There is no signs of infection.  Has great range of motion.  Assessment: Well-healing surgical foot.  X2 weeks.  Plan: Sutures removed today get her into a compression anklet and Darco shoe follow-up with her in 1 week to remove the remaining sutures.  Otherwise I will follow-up with her in 2 to 3 weeks.

## 2019-03-15 ENCOUNTER — Other Ambulatory Visit: Payer: Self-pay

## 2019-03-15 ENCOUNTER — Ambulatory Visit (INDEPENDENT_AMBULATORY_CARE_PROVIDER_SITE_OTHER): Payer: PPO

## 2019-03-15 ENCOUNTER — Ambulatory Visit (INDEPENDENT_AMBULATORY_CARE_PROVIDER_SITE_OTHER): Payer: PPO | Admitting: Podiatry

## 2019-03-15 VITALS — Temp 98.3°F

## 2019-03-15 DIAGNOSIS — M2041 Other hammer toe(s) (acquired), right foot: Secondary | ICD-10-CM

## 2019-03-15 DIAGNOSIS — M2011 Hallux valgus (acquired), right foot: Secondary | ICD-10-CM

## 2019-03-15 DIAGNOSIS — Z09 Encounter for follow-up examination after completed treatment for conditions other than malignant neoplasm: Secondary | ICD-10-CM

## 2019-03-15 DIAGNOSIS — M216X1 Other acquired deformities of right foot: Secondary | ICD-10-CM

## 2019-03-15 NOTE — Progress Notes (Signed)
Subjective: Gail Gilbert is a 68 y.o. is seen today in office s/p right foot bunionectomy, second metatarsal osteotomy with hammertoe repair 2, 3, 4 preformed on 02/18/2019 with Dr. Milinda Pointer.  Overall she states her pain is controlled she is not taking any pain medication.  She is remained in the surgical boot.  She still relates swelling.  She presents today for suture removal of the second toe.  Denies any systemic complaints such as fevers, chills, nausea, vomiting. No calf pain, chest pain, shortness of breath.   Objective: General: No acute distress, AAOx3  DP/PT pulses palpable 2/4, CRT < 3 sec to all digits.  Protective sensation intact. Motor function intact.  RIGHT foot: Incision is well coapted without any evidence of dehiscence.  Sutures are intact the second toe only.  There is no surrounding erythema, ascending cellulitis, fluctuance, crepitus, malodor, drainage/purulence. There is mild to moderate edema around the surgical site. There is no pain along the surgical site.  No other areas of tenderness to bilateral lower extremities.  No other open lesions or pre-ulcerative lesions.  No pain with calf compression, swelling, warmth, erythema.   Assessment and Plan:  Status post right foot surgery, doing well with no complications   -Treatment options discussed including all alternatives, risks, and complications -Sutures removed today without any complications after removal of the incision remained well coapted.  She can start to shower in 2 days.  Make sure the incision is doing well if there is any issues not to shower and let us know. -Remain in cam boot. -Compression anklet dispensed -Discussed range of motion exercises for the first MPJ that she can perform gradually. -Ice/elevation -Pain medication as needed. -Monitor for any clinical signs or symptoms of infection and DVT/PE and directed to call the office immediately should any occur or go to the ER. -Follow-up in 2 weeks with  Dr. Milinda Pointer or sooner if any problems arise. In the meantime, encouraged to call the office with any questions, concerns, change in symptoms.   Celesta Gentile, DPM

## 2019-03-17 ENCOUNTER — Other Ambulatory Visit: Payer: PPO

## 2019-03-24 ENCOUNTER — Other Ambulatory Visit: Payer: Self-pay | Admitting: Physician Assistant

## 2019-03-24 DIAGNOSIS — F329 Major depressive disorder, single episode, unspecified: Secondary | ICD-10-CM

## 2019-03-24 DIAGNOSIS — F419 Anxiety disorder, unspecified: Principal | ICD-10-CM

## 2019-03-31 ENCOUNTER — Ambulatory Visit: Payer: PPO | Admitting: Family Medicine

## 2019-03-31 ENCOUNTER — Other Ambulatory Visit: Payer: PPO

## 2019-04-05 ENCOUNTER — Ambulatory Visit (INDEPENDENT_AMBULATORY_CARE_PROVIDER_SITE_OTHER): Payer: PPO | Admitting: Podiatry

## 2019-04-05 ENCOUNTER — Encounter: Payer: Self-pay | Admitting: Podiatry

## 2019-04-05 ENCOUNTER — Ambulatory Visit (INDEPENDENT_AMBULATORY_CARE_PROVIDER_SITE_OTHER): Payer: PPO

## 2019-04-05 ENCOUNTER — Telehealth: Payer: Self-pay | Admitting: *Deleted

## 2019-04-05 ENCOUNTER — Other Ambulatory Visit: Payer: Self-pay

## 2019-04-05 VITALS — Temp 96.9°F

## 2019-04-05 DIAGNOSIS — M216X1 Other acquired deformities of right foot: Secondary | ICD-10-CM

## 2019-04-05 DIAGNOSIS — M2011 Hallux valgus (acquired), right foot: Secondary | ICD-10-CM

## 2019-04-05 DIAGNOSIS — M2041 Other hammer toe(s) (acquired), right foot: Secondary | ICD-10-CM

## 2019-04-05 DIAGNOSIS — Z09 Encounter for follow-up examination after completed treatment for conditions other than malignant neoplasm: Secondary | ICD-10-CM

## 2019-04-05 NOTE — Telephone Encounter (Signed)
-----  Message from [place], [facility] sent at 04/05/2019  9:58 AM EDT ----- Regarding: [facility] - In house   S/p bunion, met osteo, hammertoe right - DOS 02/18/19  Evaluate and treat  Duration: 3 x week x 4 weeks

## 2019-04-05 NOTE — Telephone Encounter (Signed)
Hand delivered referral to [facility] - In-office.

## 2019-04-06 ENCOUNTER — Encounter: Payer: Self-pay | Admitting: Podiatry

## 2019-04-06 NOTE — Progress Notes (Signed)
She presents today for follow-up of her [place] bunionectomy and metatarsal osteotomies second with hammertoe repairs #2 #3 #4 all in the right foot.  She states that she is getting some soreness but all in all it seems to be doing pretty well.  She states that she has tried fitted into a shoe but is been unable to at this point.  Objective: Vital signs are stable she is alert and oriented x3 the foot is mildly edematous distally in the first intermetatarsal space overlying the second met osteotomy area as well as the toes.  She has good range of motion of all of the toes at the metatarsal phalangeal joints and radiographs demonstrate well healing osteotomies and fusion sites.  Assessment: Well-healing surgical toes and foot.  Plan: Encourage range of motion exercises to keep the foot elevated and try to get it into a pair of shoes by next visit.  I will follow-up with her in about 4 weeks

## 2019-04-11 DIAGNOSIS — R269 Unspecified abnormalities of gait and mobility: Secondary | ICD-10-CM | POA: Diagnosis not present

## 2019-04-11 DIAGNOSIS — M25474 Effusion, right foot: Secondary | ICD-10-CM | POA: Diagnosis not present

## 2019-04-11 DIAGNOSIS — M25674 Stiffness of right foot, not elsewhere classified: Secondary | ICD-10-CM | POA: Diagnosis not present

## 2019-04-11 DIAGNOSIS — M62571 Muscle wasting and atrophy, not elsewhere classified, right ankle and foot: Secondary | ICD-10-CM | POA: Diagnosis not present

## 2019-04-13 DIAGNOSIS — M25474 Effusion, right foot: Secondary | ICD-10-CM | POA: Diagnosis not present

## 2019-04-13 DIAGNOSIS — M25674 Stiffness of right foot, not elsewhere classified: Secondary | ICD-10-CM | POA: Diagnosis not present

## 2019-04-13 DIAGNOSIS — R269 Unspecified abnormalities of gait and mobility: Secondary | ICD-10-CM | POA: Diagnosis not present

## 2019-04-13 DIAGNOSIS — M62571 Muscle wasting and atrophy, not elsewhere classified, right ankle and foot: Secondary | ICD-10-CM | POA: Diagnosis not present

## 2019-04-15 DIAGNOSIS — R269 Unspecified abnormalities of gait and mobility: Secondary | ICD-10-CM | POA: Diagnosis not present

## 2019-04-15 DIAGNOSIS — M25674 Stiffness of right foot, not elsewhere classified: Secondary | ICD-10-CM | POA: Diagnosis not present

## 2019-04-15 DIAGNOSIS — M25474 Effusion, right foot: Secondary | ICD-10-CM | POA: Diagnosis not present

## 2019-04-15 DIAGNOSIS — M62571 Muscle wasting and atrophy, not elsewhere classified, right ankle and foot: Secondary | ICD-10-CM | POA: Diagnosis not present

## 2019-04-19 ENCOUNTER — Other Ambulatory Visit: Payer: Self-pay | Admitting: Family Medicine

## 2019-04-19 DIAGNOSIS — M25474 Effusion, right foot: Secondary | ICD-10-CM | POA: Diagnosis not present

## 2019-04-19 DIAGNOSIS — F419 Anxiety disorder, unspecified: Principal | ICD-10-CM

## 2019-04-19 DIAGNOSIS — F329 Major depressive disorder, single episode, unspecified: Secondary | ICD-10-CM

## 2019-04-19 DIAGNOSIS — M62571 Muscle wasting and atrophy, not elsewhere classified, right ankle and foot: Secondary | ICD-10-CM | POA: Diagnosis not present

## 2019-04-19 DIAGNOSIS — M25674 Stiffness of right foot, not elsewhere classified: Secondary | ICD-10-CM | POA: Diagnosis not present

## 2019-04-19 DIAGNOSIS — R269 Unspecified abnormalities of gait and mobility: Secondary | ICD-10-CM | POA: Diagnosis not present

## 2019-04-20 DIAGNOSIS — M25674 Stiffness of right foot, not elsewhere classified: Secondary | ICD-10-CM | POA: Diagnosis not present

## 2019-04-20 DIAGNOSIS — M62571 Muscle wasting and atrophy, not elsewhere classified, right ankle and foot: Secondary | ICD-10-CM | POA: Diagnosis not present

## 2019-04-20 DIAGNOSIS — R269 Unspecified abnormalities of gait and mobility: Secondary | ICD-10-CM | POA: Diagnosis not present

## 2019-04-20 DIAGNOSIS — M25474 Effusion, right foot: Secondary | ICD-10-CM | POA: Diagnosis not present

## 2019-04-22 DIAGNOSIS — M25474 Effusion, right foot: Secondary | ICD-10-CM | POA: Diagnosis not present

## 2019-04-22 DIAGNOSIS — R269 Unspecified abnormalities of gait and mobility: Secondary | ICD-10-CM | POA: Diagnosis not present

## 2019-04-22 DIAGNOSIS — M62571 Muscle wasting and atrophy, not elsewhere classified, right ankle and foot: Secondary | ICD-10-CM | POA: Diagnosis not present

## 2019-04-22 DIAGNOSIS — M25674 Stiffness of right foot, not elsewhere classified: Secondary | ICD-10-CM | POA: Diagnosis not present

## 2019-04-25 ENCOUNTER — Other Ambulatory Visit: Payer: Self-pay | Admitting: Physician Assistant

## 2019-04-25 DIAGNOSIS — G0491 Myelitis, unspecified: Secondary | ICD-10-CM

## 2019-04-26 DIAGNOSIS — M25674 Stiffness of right foot, not elsewhere classified: Secondary | ICD-10-CM | POA: Diagnosis not present

## 2019-04-26 DIAGNOSIS — M62571 Muscle wasting and atrophy, not elsewhere classified, right ankle and foot: Secondary | ICD-10-CM | POA: Diagnosis not present

## 2019-04-26 DIAGNOSIS — M25474 Effusion, right foot: Secondary | ICD-10-CM | POA: Diagnosis not present

## 2019-04-26 DIAGNOSIS — R269 Unspecified abnormalities of gait and mobility: Secondary | ICD-10-CM | POA: Diagnosis not present

## 2019-04-29 DIAGNOSIS — M25674 Stiffness of right foot, not elsewhere classified: Secondary | ICD-10-CM | POA: Diagnosis not present

## 2019-04-29 DIAGNOSIS — M25474 Effusion, right foot: Secondary | ICD-10-CM | POA: Diagnosis not present

## 2019-04-29 DIAGNOSIS — R269 Unspecified abnormalities of gait and mobility: Secondary | ICD-10-CM | POA: Diagnosis not present

## 2019-04-29 DIAGNOSIS — M62571 Muscle wasting and atrophy, not elsewhere classified, right ankle and foot: Secondary | ICD-10-CM | POA: Diagnosis not present

## 2019-05-03 DIAGNOSIS — M25474 Effusion, right foot: Secondary | ICD-10-CM | POA: Diagnosis not present

## 2019-05-03 DIAGNOSIS — M62571 Muscle wasting and atrophy, not elsewhere classified, right ankle and foot: Secondary | ICD-10-CM | POA: Diagnosis not present

## 2019-05-03 DIAGNOSIS — M25674 Stiffness of right foot, not elsewhere classified: Secondary | ICD-10-CM | POA: Diagnosis not present

## 2019-05-03 DIAGNOSIS — R269 Unspecified abnormalities of gait and mobility: Secondary | ICD-10-CM | POA: Diagnosis not present

## 2019-05-05 ENCOUNTER — Ambulatory Visit (INDEPENDENT_AMBULATORY_CARE_PROVIDER_SITE_OTHER): Payer: PPO

## 2019-05-05 ENCOUNTER — Other Ambulatory Visit: Payer: Self-pay

## 2019-05-05 ENCOUNTER — Encounter: Payer: Self-pay | Admitting: Podiatry

## 2019-05-05 ENCOUNTER — Ambulatory Visit (INDEPENDENT_AMBULATORY_CARE_PROVIDER_SITE_OTHER): Payer: PPO | Admitting: Podiatry

## 2019-05-05 VITALS — Temp 97.7°F

## 2019-05-05 DIAGNOSIS — M2011 Hallux valgus (acquired), right foot: Secondary | ICD-10-CM | POA: Diagnosis not present

## 2019-05-05 DIAGNOSIS — Z9889 Other specified postprocedural states: Secondary | ICD-10-CM

## 2019-05-05 DIAGNOSIS — M2041 Other hammer toe(s) (acquired), right foot: Secondary | ICD-10-CM

## 2019-05-05 DIAGNOSIS — M216X1 Other acquired deformities of right foot: Secondary | ICD-10-CM | POA: Diagnosis not present

## 2019-05-05 DIAGNOSIS — M25674 Stiffness of right foot, not elsewhere classified: Secondary | ICD-10-CM | POA: Diagnosis not present

## 2019-05-05 DIAGNOSIS — M62571 Muscle wasting and atrophy, not elsewhere classified, right ankle and foot: Secondary | ICD-10-CM | POA: Diagnosis not present

## 2019-05-05 DIAGNOSIS — R269 Unspecified abnormalities of gait and mobility: Secondary | ICD-10-CM | POA: Diagnosis not present

## 2019-05-05 DIAGNOSIS — M25474 Effusion, right foot: Secondary | ICD-10-CM | POA: Diagnosis not present

## 2019-05-05 NOTE — Progress Notes (Signed)
She presents today date of surgery February 18, 2019 [facility] bunionectomy right states that seems to be doing quite well she is also had hammertoe repairs #2 #3 #4 the right foot doing okay she says.  Objective: Vital signs are stable she alert oriented x3 she has great range of motion of the first metatarsal phalangeal joint of the right foot with good range of motion minimal edema no erythema cellulitis drainage odor.  Radiographs of the right foot demonstrate well healing osteotomies however does appear that she has some dorsal dislocation at the second metatarsal phalangeal joint.  Assessment: Well-healing surgical foot right.  Plan: We need to allow her to get back to her regular routine currently we will discharge her from care for this foot but I do want to retain her in the next few months for considering surgical repair of her left foot.

## 2019-05-10 DIAGNOSIS — M62571 Muscle wasting and atrophy, not elsewhere classified, right ankle and foot: Secondary | ICD-10-CM | POA: Diagnosis not present

## 2019-05-10 DIAGNOSIS — M25674 Stiffness of right foot, not elsewhere classified: Secondary | ICD-10-CM | POA: Diagnosis not present

## 2019-05-10 DIAGNOSIS — R269 Unspecified abnormalities of gait and mobility: Secondary | ICD-10-CM | POA: Diagnosis not present

## 2019-05-10 DIAGNOSIS — M25474 Effusion, right foot: Secondary | ICD-10-CM | POA: Diagnosis not present

## 2019-05-11 ENCOUNTER — Ambulatory Visit: Payer: PPO | Attending: Family Medicine | Admitting: Family Medicine

## 2019-05-11 ENCOUNTER — Ambulatory Visit: Payer: PPO | Admitting: Family Medicine

## 2019-05-12 DIAGNOSIS — M62571 Muscle wasting and atrophy, not elsewhere classified, right ankle and foot: Secondary | ICD-10-CM | POA: Diagnosis not present

## 2019-05-12 DIAGNOSIS — R269 Unspecified abnormalities of gait and mobility: Secondary | ICD-10-CM | POA: Diagnosis not present

## 2019-05-12 DIAGNOSIS — M25474 Effusion, right foot: Secondary | ICD-10-CM | POA: Diagnosis not present

## 2019-05-12 DIAGNOSIS — M25674 Stiffness of right foot, not elsewhere classified: Secondary | ICD-10-CM | POA: Diagnosis not present

## 2019-05-17 ENCOUNTER — Telehealth: Payer: Self-pay | Admitting: Gastroenterology

## 2019-05-17 ENCOUNTER — Other Ambulatory Visit: Payer: Self-pay | Admitting: Family Medicine

## 2019-05-17 ENCOUNTER — Other Ambulatory Visit: Payer: Self-pay

## 2019-05-17 DIAGNOSIS — R269 Unspecified abnormalities of gait and mobility: Secondary | ICD-10-CM | POA: Diagnosis not present

## 2019-05-17 DIAGNOSIS — M25674 Stiffness of right foot, not elsewhere classified: Secondary | ICD-10-CM | POA: Diagnosis not present

## 2019-05-17 DIAGNOSIS — K449 Diaphragmatic hernia without obstruction or gangrene: Secondary | ICD-10-CM

## 2019-05-17 DIAGNOSIS — M25474 Effusion, right foot: Secondary | ICD-10-CM | POA: Diagnosis not present

## 2019-05-17 DIAGNOSIS — M62571 Muscle wasting and atrophy, not elsewhere classified, right ankle and foot: Secondary | ICD-10-CM | POA: Diagnosis not present

## 2019-05-17 MED ORDER — OMEPRAZOLE 40 MG PO CPDR: DELAYED_RELEASE_CAPSULE | ORAL | 3 refills | Status: DC

## 2019-05-17 NOTE — Telephone Encounter (Signed)
Complains of a nocturnal burning in her throat. Denies nausea or vomiting. Has tried [place]. Did not feel that was effective. She is not on PEPCID. States she was never able to get it. Patient would like to try Omeprazole again. Okay to resume that prescription?

## 2019-05-17 NOTE — Telephone Encounter (Signed)
Patient instructed. Agrees to follow up as virtual visit. States she is familiar with the process. Has "Zoom" on her phone if that needs to be the format for the visit. Medication transmitted to her pharmacy.

## 2019-05-17 NOTE — Telephone Encounter (Signed)
Ok to start Omeprazole 40mg  daily, 30 mins before breakfast or dinner. Anti reflux measures. Follow up virtual visit. Thanks

## 2019-05-19 DIAGNOSIS — M25474 Effusion, right foot: Secondary | ICD-10-CM | POA: Diagnosis not present

## 2019-05-19 DIAGNOSIS — M62571 Muscle wasting and atrophy, not elsewhere classified, right ankle and foot: Secondary | ICD-10-CM | POA: Diagnosis not present

## 2019-05-19 DIAGNOSIS — R269 Unspecified abnormalities of gait and mobility: Secondary | ICD-10-CM | POA: Diagnosis not present

## 2019-05-19 DIAGNOSIS — M25674 Stiffness of right foot, not elsewhere classified: Secondary | ICD-10-CM | POA: Diagnosis not present

## 2019-06-06 ENCOUNTER — Other Ambulatory Visit: Payer: Self-pay | Admitting: Family Medicine

## 2019-06-06 DIAGNOSIS — Z1231 Encounter for screening mammogram for malignant neoplasm of breast: Secondary | ICD-10-CM

## 2019-06-08 ENCOUNTER — Encounter: Payer: Self-pay | Admitting: *Deleted

## 2019-06-09 ENCOUNTER — Encounter: Payer: Self-pay | Admitting: Gastroenterology

## 2019-06-09 ENCOUNTER — Ambulatory Visit (INDEPENDENT_AMBULATORY_CARE_PROVIDER_SITE_OTHER): Payer: PPO | Admitting: Gastroenterology

## 2019-06-09 VITALS — Ht 67.0 in | Wt 140.0 lb

## 2019-06-09 DIAGNOSIS — K2990 Gastroduodenitis, unspecified, without bleeding: Secondary | ICD-10-CM | POA: Diagnosis not present

## 2019-06-09 DIAGNOSIS — K219 Gastro-esophageal reflux disease without esophagitis: Secondary | ICD-10-CM

## 2019-06-09 DIAGNOSIS — K2970 Gastritis, unspecified, without bleeding: Secondary | ICD-10-CM

## 2019-06-09 MED ORDER — FAMOTIDINE 20 MG PO TABS: 20.0000 mg | ORAL_TABLET | Freq: Every day | ORAL | 11 refills | Status: DC

## 2019-06-09 NOTE — Patient Instructions (Addendum)
Pepcid 20 mg daily at bedtime as needed  Use Gaviscon 1 tablet after meals and at bedtime as needed  Antireflux measures  Follow-up in 1 year  I appreciate the  opportunity to care for you  Thank You   Harl Bowie , MD    Gastroesophageal Reflux Disease, Adult Gastroesophageal reflux (GER) happens when acid from the stomach flows up into the tube that connects the mouth and the stomach (esophagus). Normally, food travels down the esophagus and stays in the stomach to be digested. However, when a person has GER, food and stomach acid sometimes move back up into the esophagus. If this becomes a more serious problem, the person may be diagnosed with a disease called gastroesophageal reflux disease (GERD). GERD occurs when the reflux:  Happens often.  Causes frequent or severe symptoms.  Causes problems such as damage to the esophagus. When stomach acid comes in contact with the esophagus, the acid may cause soreness (inflammation) in the esophagus. Over time, GERD may create small holes (ulcers) in the lining of the esophagus. What are the causes? This condition is caused by a problem with the muscle between the esophagus and the stomach (lower esophageal sphincter, or LES). Normally, the LES muscle closes after food passes through the esophagus to the stomach. When the LES is weakened or abnormal, it does not close properly, and that allows food and stomach acid to go back up into the esophagus. The LES can be weakened by certain dietary substances, medicines, and medical conditions, including:  Tobacco use.  Pregnancy.  Having a hiatal hernia.  Alcohol use.  Certain foods and beverages, such as coffee, chocolate, onions, and peppermint. What increases the risk? You are more likely to develop this condition if you:  Have an increased body weight.  Have a connective tissue disorder.  Use NSAID medicines. What are the signs or symptoms? Symptoms of this condition  include:  Heartburn.  Difficult or painful swallowing.  The feeling of having a lump in the throat.  Abitter taste in the mouth.  Bad breath.  Having a large amount of saliva.  Having an upset or bloated stomach.  Belching.  Chest pain. Different conditions can cause chest pain. Make sure you see your health care provider if you experience chest pain.  Shortness of breath or wheezing.  Ongoing (chronic) cough or a night-time cough.  Wearing away of tooth enamel.  Weight loss. How is this diagnosed? Your health care provider will take a medical history and perform a physical exam. To determine if you have mild or severe GERD, your health care provider may also monitor how you respond to treatment. You may also have tests, including:  A test to examine your stomach and esophagus with a small camera (endoscopy).  A test thatmeasures the acidity level in your esophagus.  A test thatmeasures how much pressure is on your esophagus.  A barium swallow or modified barium swallow test to show the shape, size, and functioning of your esophagus. How is this treated? The goal of treatment is to help relieve your symptoms and to prevent complications. Treatment for this condition may vary depending on how severe your symptoms are. Your health care provider may recommend:  Changes to your diet.  Medicine.  Surgery. Follow these instructions at home: Eating and drinking   Follow a diet as recommended by your health care provider. This may involve avoiding foods and drinks such as: ? Coffee and tea (with or without caffeine). ? Drinks that  containalcohol. ? Energy drinks and sports drinks. ? Carbonated drinks or sodas. ? Chocolate and cocoa. ? Peppermint and mint flavorings. ? Garlic and onions. ? Horseradish. ? Spicy and acidic foods, including peppers, chili powder, curry powder, vinegar, hot sauces, and barbecue sauce. ? Citrus fruit juices and citrus fruits, such as  oranges, lemons, and limes. ? Tomato-based foods, such as red sauce, chili, salsa, and pizza with red sauce. ? Fried and fatty foods, such as donuts, french fries, potato chips, and high-fat dressings. ? High-fat meats, such as hot dogs and fatty cuts of red and white meats, such as rib eye steak, sausage, ham, and bacon. ? High-fat dairy items, such as whole milk, butter, and cream cheese.  Eat small, frequent meals instead of large meals.  Avoid drinking large amounts of liquid with your meals.  Avoid eating meals during the 2-3 hours before bedtime.  Avoid lying down right after you eat.  Do not exercise right after you eat. Lifestyle   Do not use any products that contain nicotine or tobacco, such as cigarettes, e-cigarettes, and chewing tobacco. If you need help quitting, ask your health care provider.  Try to reduce your stress by using methods such as yoga or meditation. If you need help reducing stress, ask your health care provider.  If you are overweight, reduce your weight to an amount that is healthy for you. Ask your health care provider for guidance about a safe weight loss goal. General instructions  Pay attention to any changes in your symptoms.  Take over-the-counter and prescription medicines only as told by your health care provider. Do not take aspirin, ibuprofen, or other NSAIDs unless your health care provider told you to do so.  Wear loose-fitting clothing. Do not wear anything tight around your waist that causes pressure on your abdomen.  Raise (elevate) the head of your bed about 6 inches (15 cm).  Avoid bending over if this makes your symptoms worse.  Keep all follow-up visits as told by your health care provider. This is important. Contact a health care provider if:  You have: ? New symptoms. ? Unexplained weight loss. ? Difficulty swallowing or it hurts to swallow. ? Wheezing or a persistent cough. ? A hoarse voice.  Your symptoms do not  improve with treatment. Get help right away if you:  Have pain in your arms, neck, jaw, teeth, or back.  Feel sweaty, dizzy, or light-headed.  Have chest pain or shortness of breath.  Vomit and your vomit looks like blood or coffee grounds.  Faint.  Have stool that is bloody or black.  Cannot swallow, drink, or eat. Summary  Gastroesophageal reflux happens when acid from the stomach flows up into the esophagus. GERD is a disease in which the reflux happens often, causes frequent or severe symptoms, or causes problems such as damage to the esophagus.  Treatment for this condition may vary depending on how severe your symptoms are. Your health care provider may recommend diet and lifestyle changes, medicine, or surgery.  Contact a health care provider if you have new or worsening symptoms.  Take over-the-counter and prescription medicines only as told by your health care provider. Do not take aspirin, ibuprofen, or other NSAIDs unless your health care provider told you to do so.  Keep all follow-up visits as told by your health care provider. This is important. This information is not intended to replace advice given to you by your health care provider. Make sure you discuss any questions  you have with your health care provider. Document Released: 09/10/2005 Document Revised: 06/09/2018 Document Reviewed: 06/09/2018 Elsevier Interactive Patient Education  2019 [employer].

## 2019-06-14 NOTE — Progress Notes (Signed)
Gail Gilbert    [id number]    03/15/1951  Primary Care Physician:Newlin, Charlane Ferretti, MD  Referring Physician: Charlott Rakes, MD [address] [place],  [place] [address]  This service was provided via audio and video telemedicine (Doximity) due to [place] 19 pandemic.  Patient location: Home Provider location: Office Used 2 patient identifiers to confirm the correct person. Explained the limitations in evaluation and management via telemedicine. Patient is aware of potential medical charges for this visit.  Patient consented to this virtual visit.  The persons participating in this telemedicine service were myself and the patient    Chief complaint:  GERD  HPI: 68 year old female with history of transverse myelitis GERD and erosive gastritis for follow-up visit.  Last seen in August 2019. She is doing better with changes in diet and lifestyle modifications.  She is avoiding NSAIDs.  No longer taking omeprazole, concerned about potential side effects with long-term use.  She has occasional heartburn, worse mostly at bedtime.  No dysphagia or odynophagia. Denies any nausea, vomiting, abdominal pain, melena or bright red blood per rectum  EGD August 12, 2018 showed erosive gastritis with hemorrhage, biopsies negative for H. pylori, normal duodenum and esophagus   Outpatient Encounter Medications as of 06/09/2019  Medication Sig  . acetaminophen (TYLENOL) 500 MG tablet Take 500 mg by mouth every 6 (six) hours as needed.  . gabapentin (NEURONTIN) 300 MG capsule TAKE 1 CAPSULE(300 MG) BY MOUTH TWICE DAILY  . sertraline (ZOLOFT) 25 MG tablet TAKE 1 TABLET(25 MG) BY MOUTH DAILY  . traZODone (DESYREL) 50 MG tablet TAKE 1 TABLET(50 MG) BY MOUTH AT BEDTIME  . famotidine (PEPCID) 20 MG tablet Take 1 tablet (20 mg total) by mouth at bedtime.  . [DISCONTINUED] famotidine (PEPCID) 20 MG tablet Take 1 tablet (20 mg total) by mouth at bedtime.   No facility-administered  encounter medications on file as of 06/09/2019.     Allergies as of 06/09/2019 - Review Complete 06/08/2019  Allergen Reaction Noted  . Antihistamines, diphenhydramine-type Palpitations 02/19/2013  . Omeprazole  08/11/2018  . Pseudoephedrine hcl Other (See Comments) 11/07/2016    Past Medical History:  Diagnosis Date  . Cataract, immature   . Dental crowns present   . GERD (gastroesophageal reflux disease)   . Hammer toe of right foot 07/2016   2nd and 3rd toes  . IBS (irritable bowel syndrome)    no current problems  . Idiopathic transverse myelitis ([place]) 2014   residual numbness feet and right small finger  . Osteoarthritis    cervical spine, lumbar spine  . RLS (restless legs syndrome)    no current med.    Past Surgical History:  Procedure Laterality Date  . BUNIONECTOMY Bilateral   . COLONOSCOPY    . COLONOSCOPY WITH PROPOFOL  11/26/2015  . FOOT SURGERY    . HAMMER TOE SURGERY  10/29/2011   Procedure: HAMMER TOE CORRECTION;  Surgeon: Kerin Salen;  Location: [facility];  Service: Orthopedics;  Laterality: Left;  Left 2nd toe duvries arthroplasty  . HAMMER TOE SURGERY Right 07/23/2016   Procedure: [place];  Surgeon: Frederik Pear, MD;  Location: [facility];  Service: Orthopedics;  Laterality: Right;  . MIDDLE EAR SURGERY     to relieve ringing in ear  . POLYPECTOMY    . SYNOVIAL CYST EXCISION     lumbar back    Family History  Problem Relation  Age of Onset  . Arthritis Mother        spinal stenosis  . Cataracts Mother   . Heart disease Mother        pacemaker  . Hypertension Father   . Heart disease Father        Quarry manager  . Congestive Heart Failure Father     Social History   Socioeconomic History  . Marital status: Married    Spouse name: Gershon Mussel  . Number of children: 1  . Years of education: college  . Highest education level: Not on file  Occupational History  . Occupation:  unemployed    Comment: former S[employer]  . Financial resource strain: Not on file  . Food insecurity    Worry: Not on file    Inability: Not on file  . Transportation needs    Medical: Not on file    Non-medical: Not on file  Tobacco Use  . Smoking status: Never Smoker  . Smokeless tobacco: Never Used  Substance and Sexual Activity  . Alcohol use: Yes    Alcohol/week: 0.0 standard drinks    Comment: occasionally  . Drug use: No  . Sexual activity: Yes    Partners: Male    Birth control/protection: Post-menopausal  Lifestyle  . Physical activity    Days per week: Not on file    Minutes per session: Not on file  . Stress: Not on file  Relationships  . Social Herbalist on phone: Not on file    Gets together: Not on file    Attends religious service: Not on file    Active member of club or organization: Not on file    Attends meetings of clubs or organizations: Not on file    Relationship status: Not on file  . Intimate partner violence    Fear of current or ex partner: Not on file    Emotionally abused: Not on file    Physically abused: Not on file    Forced sexual activity: Not on file  Other Topics Concern  . Not on file  Social History Narrative   Divorced.   Lives alone.   Adult children live in town.  Her parents are in [place]    Patient is right handed   Education level is some college   Caffeine consumption is a couple of cups a day             Review of systems: Review of Systems as per HPI All other systems reviewed and are negative.   Physical Exam: Vitals were not taken and physical exam was not performed during this virtual visit.  Data Reviewed:  Reviewed labs, radiology imaging, old records and pertinent past GI work up   Assessment and Plan/Recommendations:  68 year old female with idiopathic transverse myelitis, GERD and erosive gastritis secondary to NSAIDs  GERD: Start Pepcid 20 mg daily at bedtime as  needed Okay to use Gaviscon for postprandial breakthrough symptoms and at bedtime as needed Antireflux measures  Continue to avoid NSAIDs to prevent gastritis  Return in 1 year    K. Denzil Magnuson , MD   CC: Charlott Rakes, MD

## 2019-06-18 ENCOUNTER — Other Ambulatory Visit: Payer: Self-pay | Admitting: Family Medicine

## 2019-06-18 DIAGNOSIS — F419 Anxiety disorder, unspecified: Secondary | ICD-10-CM

## 2019-06-18 DIAGNOSIS — F329 Major depressive disorder, single episode, unspecified: Secondary | ICD-10-CM

## 2019-06-24 DIAGNOSIS — L821 Other seborrheic keratosis: Secondary | ICD-10-CM | POA: Diagnosis not present

## 2019-07-08 ENCOUNTER — Other Ambulatory Visit: Payer: Self-pay | Admitting: Family Medicine

## 2019-07-08 DIAGNOSIS — F32A Depression, unspecified: Secondary | ICD-10-CM

## 2019-07-08 DIAGNOSIS — F329 Major depressive disorder, single episode, unspecified: Secondary | ICD-10-CM

## 2019-07-18 ENCOUNTER — Other Ambulatory Visit: Payer: Self-pay | Admitting: Family Medicine

## 2019-07-18 DIAGNOSIS — F329 Major depressive disorder, single episode, unspecified: Secondary | ICD-10-CM

## 2019-07-18 DIAGNOSIS — F419 Anxiety disorder, unspecified: Secondary | ICD-10-CM

## 2019-07-22 ENCOUNTER — Ambulatory Visit
Admission: RE | Admit: 2019-07-22 | Discharge: 2019-07-22 | Disposition: A | Discharge status: Home / Self Care | Payer: PPO | Source: Ambulatory Visit | Attending: Family Medicine | Admitting: Family Medicine

## 2019-07-22 ENCOUNTER — Other Ambulatory Visit: Payer: Self-pay

## 2019-07-22 DIAGNOSIS — Z1231 Encounter for screening mammogram for malignant neoplasm of breast: Secondary | ICD-10-CM | POA: Diagnosis not present

## 2019-09-29 ENCOUNTER — Other Ambulatory Visit: Payer: Self-pay | Admitting: Family Medicine

## 2019-09-29 DIAGNOSIS — F32A Depression, unspecified: Secondary | ICD-10-CM

## 2019-09-29 DIAGNOSIS — F419 Anxiety disorder, unspecified: Secondary | ICD-10-CM

## 2019-09-29 DIAGNOSIS — F329 Major depressive disorder, single episode, unspecified: Secondary | ICD-10-CM

## 2019-10-18 ENCOUNTER — Other Ambulatory Visit: Payer: Self-pay | Admitting: Family Medicine

## 2019-10-18 DIAGNOSIS — G0491 Myelitis, unspecified: Secondary | ICD-10-CM

## 2019-10-26 ENCOUNTER — Other Ambulatory Visit: Payer: Self-pay | Admitting: Family Medicine

## 2019-10-26 DIAGNOSIS — F329 Major depressive disorder, single episode, unspecified: Secondary | ICD-10-CM

## 2019-10-26 DIAGNOSIS — F32A Depression, unspecified: Secondary | ICD-10-CM

## 2019-10-26 DIAGNOSIS — F419 Anxiety disorder, unspecified: Secondary | ICD-10-CM

## 2019-12-22 ENCOUNTER — Encounter: Payer: Self-pay | Admitting: Podiatry

## 2019-12-22 ENCOUNTER — Other Ambulatory Visit: Payer: Self-pay

## 2019-12-22 ENCOUNTER — Ambulatory Visit (INDEPENDENT_AMBULATORY_CARE_PROVIDER_SITE_OTHER): Payer: PPO

## 2019-12-22 ENCOUNTER — Ambulatory Visit: Payer: PPO | Admitting: Podiatry

## 2019-12-22 ENCOUNTER — Other Ambulatory Visit: Payer: Self-pay | Admitting: Podiatry

## 2019-12-22 DIAGNOSIS — L603 Nail dystrophy: Secondary | ICD-10-CM

## 2019-12-22 DIAGNOSIS — M2042 Other hammer toe(s) (acquired), left foot: Secondary | ICD-10-CM

## 2019-12-22 DIAGNOSIS — B351 Tinea unguium: Secondary | ICD-10-CM | POA: Diagnosis not present

## 2019-12-22 DIAGNOSIS — M2012 Hallux valgus (acquired), left foot: Secondary | ICD-10-CM

## 2019-12-22 DIAGNOSIS — M6240 Contracture of muscle, unspecified site: Secondary | ICD-10-CM

## 2019-12-22 NOTE — Progress Notes (Signed)
She presents today to discuss surgery regarding her left foot.  She states that her toes are curling down and they are starting to hurt if she walks is causing pain at the knuckles as well.  She denies any change in her past medical history medications allergies surgeries and social history.  Objective: Vital signs are stable she is alert oriented x3 severe hammertoe deformity #3 #4 of the left foot and a flexor contraction of the DIPJ to and already fused PIPJ second digit left foot.  Assessment: Hammertoe deformities #2 #3 #4 the left foot.  Plan: Discussed etiology pathology and surgical therapies consented her for surgical repair with a tenotomy of the second flexor tendon and a hammertoe repair with screws to toes #3 and 4 of the left foot.

## 2019-12-22 NOTE — Patient Instructions (Signed)
Pre-Operative Instructions  Congratulations, you have decided to take an important step towards improving your quality of life.  You can be assured that the doctors and staff at [facility] will be with you every step of the way.  Here are some important things you should know:  1. Plan to be at the surgery center/hospital at least 1 (one) hour prior to your scheduled time, unless otherwise directed by the surgical center/hospital staff.  You must have a responsible adult accompany you, remain during the surgery and drive you home.  Make sure you have directions to the surgical center/hospital to ensure you arrive on time. 2. If you are having surgery at [facility] or [facility] hospitals, you will need a copy of your medical history and physical form from your family physician within one month prior to the date of surgery. We will give you a form for your primary physician to complete.  3. We make every effort to accommodate the date you request for surgery.  However, there are times where surgery dates or times have to be moved.  We will contact you as soon as possible if a change in schedule is required.   4. No aspirin/ibuprofen for one week before surgery.  If you are on aspirin, any non-steroidal anti-inflammatory medications (Mobic, Aleve, Ibuprofen) should not be taken seven (7) days prior to your surgery.  You make take Tylenol for pain prior to surgery.  5. Medications - If you are taking daily heart and blood pressure medications, seizure, reflux, allergy, asthma, anxiety, pain or diabetes medications, make sure you notify the surgery center/hospital before the day of surgery so they can tell you which medications you should take or avoid the day of surgery. 6. No food or drink after midnight the night before surgery unless directed otherwise by surgical center/hospital staff. 7. No alcoholic beverages 24-hours prior to surgery.  No smoking 24-hours prior or 24-hours after  surgery. 8. Wear loose pants or shorts. They should be loose enough to fit over bandages, boots, and casts. 9. Don't wear slip-on shoes. Sneakers are preferred. 10. Bring your boot with you to the surgery center/hospital.  Also bring crutches or a walker if your physician has prescribed it for you.  If you do not have this equipment, it will be provided for you after surgery. 11. If you have not been contacted by the surgery center/hospital by the day before your surgery, call to confirm the date and time of your surgery. 12. Leave-time from work may vary depending on the type of surgery you have.  Appropriate arrangements should be made prior to surgery with your employer. 13. Prescriptions will be provided immediately following surgery by your doctor.  Fill these as soon as possible after surgery and take the medication as directed. Pain medications will not be refilled on weekends and must be approved by the doctor. 14. Remove nail polish on the operative foot and avoid getting pedicures prior to surgery. 15. Wash the night before surgery.  The night before surgery wash the foot and leg well with water and the antibacterial soap provided. Be sure to pay special attention to beneath the toenails and in between the toes.  Wash for at least three (3) minutes. Rinse thoroughly with water and dry well with a towel.  Perform this wash unless told not to do so by your physician.  Enclosed: 1 Ice pack (please put in freezer the night before surgery)   1 Hibiclens skin cleaner     Pre-op instructions  If you have any questions regarding the instructions, please do not hesitate to call our office.  [place]: [address], [place], [place] [address] -- [phone]  [place]: [address] [place], [place] [address] -- [phone]  [place]: [address], [place], [place] [address] -- [phone]   Website: [email or web address] 

## 2019-12-27 ENCOUNTER — Ambulatory Visit: Payer: PPO | Attending: Family Medicine | Admitting: Family Medicine

## 2019-12-27 ENCOUNTER — Other Ambulatory Visit: Payer: Self-pay

## 2019-12-27 DIAGNOSIS — Z13228 Encounter for screening for other metabolic disorders: Secondary | ICD-10-CM | POA: Diagnosis not present

## 2019-12-27 DIAGNOSIS — G0491 Myelitis, unspecified: Secondary | ICD-10-CM | POA: Diagnosis not present

## 2019-12-27 DIAGNOSIS — F329 Major depressive disorder, single episode, unspecified: Secondary | ICD-10-CM

## 2019-12-27 DIAGNOSIS — F32A Depression, unspecified: Secondary | ICD-10-CM

## 2019-12-27 DIAGNOSIS — F419 Anxiety disorder, unspecified: Secondary | ICD-10-CM

## 2019-12-27 MED ORDER — TRAZODONE HCL 50 MG PO TABS: 50.0000 mg | ORAL_TABLET | Freq: Every day | ORAL | 1 refills | Status: DC

## 2019-12-27 MED ORDER — SERTRALINE HCL 25 MG PO TABS: ORAL_TABLET | ORAL | 1 refills | Status: DC

## 2019-12-27 MED ORDER — GABAPENTIN 300 MG PO CAPS: 300.0000 mg | ORAL_CAPSULE | Freq: Every day | ORAL | 1 refills | Status: DC

## 2019-12-27 NOTE — Progress Notes (Signed)
Virtual Visit via Telephone Note  I connected with [name], on 12/27/2019 at 3:25 PM by telephone due to the COVID-19 pandemic and verified that I am speaking with the correct person using two identifiers.   Consent: I discussed the limitations, risks, security and privacy concerns of performing an evaluation and management service by telephone and the availability of in person appointments. I also discussed with the patient that there may be a patient responsible charge related to this service. The patient expressed understanding and agreed to proceed.   Location of Patient: Home  Location of Provider: Clinic   Persons participating in Telemedicine visit: Brandalynn Brugger Farrington-CMA Dr. Margarita Rana     History of Present Illness: Gail Gilbert is a 69 year old female with a history of anxiety and depression, idiopathic transverse myelitis seen today for follow-up visit.   She has not ben seen since 2019; she has been feeling okay.  With regards to her transverse myelitis she has balance issues and numbness; sometimes she gets muscle spasms in her legs but does not need muscle relaxants she states. She is on Trazodone for insomnia, Zoloft for anxiety and depression, gabapentin for her paresthesias. She has no acute concerns today. Her last set of labs was in 03/2018.  Past Medical History:  Diagnosis Date  . Cataract, immature   . Dental crowns present   . GERD (gastroesophageal reflux disease)   . Hammer toe of right foot 07/2016   2nd and 3rd toes  . IBS (irritable bowel syndrome)    no current problems  . Idiopathic transverse myelitis ([place]) 2014   residual numbness feet and right small finger  . Osteoarthritis    cervical spine, lumbar spine  . RLS (restless legs syndrome)    no current med.   Allergies  Allergen Reactions  . Antihistamines, Diphenhydramine-Type Palpitations  . Omeprazole     Joint pain, flu-like symptoms  . Pseudoephedrine Hcl Other  (See Comments)    Current Outpatient Medications on File Prior to Visit  Medication Sig Dispense Refill  . acetaminophen (TYLENOL) 500 MG tablet Take 500 mg by mouth every 6 (six) hours as needed.    . gabapentin (NEURONTIN) 300 MG capsule TAKE 1 CAPSULE(300 MG) BY MOUTH TWICE DAILY 180 capsule 1  . sertraline (ZOLOFT) 25 MG tablet TAKE 1 TABLET(25 MG) BY MOUTH DAILY 30 tablet 0  . traZODone (DESYREL) 50 MG tablet Take 1 tablet (50 mg total) by mouth at bedtime. MUST MAKE APPT FOR FURTHER REFILLS 30 tablet 0  . famotidine (PEPCID) 20 MG tablet Take 1 tablet (20 mg total) by mouth at bedtime. 30 tablet 11   No current facility-administered medications on file prior to visit.    Observations/Objective: Alert, awake, oriented x3 Not in acute distress  Assessment and Plan: 1. Anxiety and depression Controlled - traZODone (DESYREL) 50 MG tablet; Take 1 tablet (50 mg total) by mouth at bedtime.  Dispense: 90 tablet; Refill: 1 - sertraline (ZOLOFT) 25 MG tablet; TAKE 1 TABLET(25 MG) BY MOUTH DAILY  Dispense: 90 tablet; Refill: 1  2. Myelitis ([place]) Residual paresthesias and balance issues Stable with no acute exacerbation Doing well on gabapentin - gabapentin (NEURONTIN) 300 MG capsule; Take 1 capsule (300 mg total) by mouth at bedtime.  Dispense: 90 capsule; Refill: 1  3. Screening for metabolic disorder - Complete Metabolic Panel with GFR; Future - Lipid panel; Future   Follow Up Instructions: 6 months   I discussed the assessment and treatment plan  with the patient. The patient was provided an opportunity to ask questions and all were answered. The patient agreed with the plan and demonstrated an understanding of the instructions.   The patient was advised to call back or seek an in-person evaluation if the symptoms worsen or if the condition fails to improve as anticipated.     I provided 16 minutes total of non-face-to-face time during this encounter including median  intraservice time, reviewing previous notes, investigations, ordering medications, medical decision making, coordinating care and patient verbalized understanding at the end of the visit.     Charlott Rakes, MD, FAAFP. [facility], [place]   12/27/2019, 3:25 PM

## 2019-12-27 NOTE — Progress Notes (Signed)
Patient has been called and DOB has been verified. Patient has been screened and transferred to PCP to start phone visit.     

## 2019-12-28 ENCOUNTER — Encounter: Payer: Self-pay | Admitting: Family Medicine

## 2020-01-03 ENCOUNTER — Ambulatory Visit: Payer: PPO | Attending: Family Medicine

## 2020-01-03 ENCOUNTER — Other Ambulatory Visit: Payer: Self-pay

## 2020-01-03 DIAGNOSIS — Z13228 Encounter for screening for other metabolic disorders: Secondary | ICD-10-CM | POA: Diagnosis not present

## 2020-01-04 ENCOUNTER — Other Ambulatory Visit: Payer: Self-pay | Admitting: Family Medicine

## 2020-01-04 LAB — CMP14+EGFR
ALT: 20 IU/L (ref 0–32)
AST: 26 IU/L (ref 0–40)
Albumin/Globulin Ratio: 2.4 — ABNORMAL HIGH (ref 1.2–2.2)
Albumin: 4.5 g/dL (ref 3.8–4.8)
Alkaline Phosphatase: 95 IU/L (ref 39–117)
BUN/Creatinine Ratio: 24 (ref 12–28)
BUN: 18 mg/dL (ref 8–27)
Bilirubin Total: 0.4 mg/dL (ref 0.0–1.2)
CO2: 25 mmol/L (ref 20–29)
Calcium: 9.6 mg/dL (ref 8.7–10.3)
Chloride: 97 mmol/L (ref 96–106)
Creatinine, Ser: 0.74 mg/dL (ref 0.57–1.00)
GFR calc Af Amer: 96 mL/min/{1.73_m2} (ref >59)
GFR calc non Af Amer: 84 mL/min/{1.73_m2} (ref >59)
Globulin, Total: 1.9 g/dL (ref 1.5–4.5)
Glucose: 87 mg/dL (ref 65–99)
Potassium: 4.3 mmol/L (ref 3.5–5.2)
Sodium: 140 mmol/L (ref 134–144)
Total Protein: 6.4 g/dL (ref 6.0–8.5)

## 2020-01-04 LAB — LIPID PANEL
Chol/HDL Ratio: 2.6 ratio (ref 0.0–4.4)
Cholesterol, Total: 253 mg/dL — ABNORMAL HIGH (ref 100–199)
HDL: 98 mg/dL (ref >39)
LDL Chol Calc (NIH): 146 mg/dL — ABNORMAL HIGH (ref 0–99)
Triglycerides: 57 mg/dL (ref 0–149)
VLDL Cholesterol Cal: 9 mg/dL (ref 5–40)

## 2020-01-04 MED ORDER — ATORVASTATIN CALCIUM 20 MG PO TABS: 20.0000 mg | ORAL_TABLET | Freq: Every day | ORAL | 6 refills | Status: DC

## 2020-01-09 ENCOUNTER — Telehealth: Payer: Self-pay | Admitting: Podiatry

## 2020-01-09 NOTE — Telephone Encounter (Signed)
DOS: 02/03/2020  SURGICAL PROCEDURES: Flexor Tenotomy Single 2nd [id number]), Hammertoe Repair 3rd and 4th [id number]).  [employer] Effective 12/16/2019 -  [employer] Auth# [id number] valid from 02/03/2020 - 05/03/2020.

## 2020-02-01 ENCOUNTER — Other Ambulatory Visit: Payer: Self-pay | Admitting: Podiatry

## 2020-02-01 MED ORDER — OXYCODONE-ACETAMINOPHEN 10-325 MG PO TABS: 1.0000 | ORAL_TABLET | Freq: Three times a day (TID) | ORAL | 0 refills | Status: AC | PRN

## 2020-02-01 MED ORDER — CEPHALEXIN 500 MG PO CAPS: 500.0000 mg | ORAL_CAPSULE | Freq: Three times a day (TID) | ORAL | 0 refills | Status: DC

## 2020-02-01 MED ORDER — ONDANSETRON HCL 4 MG PO TABS: 4.0000 mg | ORAL_TABLET | Freq: Three times a day (TID) | ORAL | 0 refills | Status: DC | PRN

## 2020-02-03 DIAGNOSIS — K219 Gastro-esophageal reflux disease without esophagitis: Secondary | ICD-10-CM | POA: Diagnosis not present

## 2020-02-03 DIAGNOSIS — M2042 Other hammer toe(s) (acquired), left foot: Secondary | ICD-10-CM | POA: Diagnosis not present

## 2020-02-03 DIAGNOSIS — M24575 Contracture, left foot: Secondary | ICD-10-CM | POA: Diagnosis not present

## 2020-02-03 DIAGNOSIS — M25572 Pain in left ankle and joints of left foot: Secondary | ICD-10-CM | POA: Diagnosis not present

## 2020-02-03 DIAGNOSIS — M62472 Contracture of muscle, left ankle and foot: Secondary | ICD-10-CM | POA: Diagnosis not present

## 2020-02-09 ENCOUNTER — Ambulatory Visit (INDEPENDENT_AMBULATORY_CARE_PROVIDER_SITE_OTHER): Payer: PPO

## 2020-02-09 ENCOUNTER — Encounter: Payer: Self-pay | Admitting: Podiatry

## 2020-02-09 ENCOUNTER — Ambulatory Visit (INDEPENDENT_AMBULATORY_CARE_PROVIDER_SITE_OTHER): Payer: PPO | Admitting: Podiatry

## 2020-02-09 ENCOUNTER — Other Ambulatory Visit: Payer: Self-pay

## 2020-02-09 VITALS — Temp 97.3°F

## 2020-02-09 DIAGNOSIS — M2042 Other hammer toe(s) (acquired), left foot: Secondary | ICD-10-CM | POA: Diagnosis not present

## 2020-02-09 DIAGNOSIS — M6240 Contracture of muscle, unspecified site: Secondary | ICD-10-CM | POA: Diagnosis not present

## 2020-02-09 DIAGNOSIS — Z9889 Other specified postprocedural states: Secondary | ICD-10-CM

## 2020-02-09 NOTE — Progress Notes (Signed)
She presents today for her first postop visit date of surgery is 02/03/2020 flexor tenotomy second toe left foot hammertoe repair third and fourth toes left foot with extensor tenotomies.  She denies fever chills nausea vomiting muscle aches pains calf pain back pain chest pain shortness of breath.  Objective: Dry sterile dressing intact presents with her cam walker today once removed demonstrates no erythema it is mild edema no cellulitis drainage or odor.  Sutures are in place margins well coapted.  Radiographs demonstrate well healing surgical toes.  Assessment well-healing surgical foot left.  Plan: Redressed today dressed a compressive dressing keep it dry clean and elevated follow-up with me in 1 week for suture removal.

## 2020-02-21 ENCOUNTER — Ambulatory Visit (INDEPENDENT_AMBULATORY_CARE_PROVIDER_SITE_OTHER): Payer: PPO | Admitting: Podiatry

## 2020-02-21 ENCOUNTER — Encounter: Payer: Self-pay | Admitting: Podiatry

## 2020-02-21 ENCOUNTER — Other Ambulatory Visit: Payer: Self-pay

## 2020-02-21 VITALS — BP 97/64 | HR 75 | Temp 97.2°F | Resp 16

## 2020-02-21 DIAGNOSIS — M2042 Other hammer toe(s) (acquired), left foot: Secondary | ICD-10-CM | POA: Diagnosis not present

## 2020-02-21 DIAGNOSIS — M6240 Contracture of muscle, unspecified site: Secondary | ICD-10-CM

## 2020-02-21 DIAGNOSIS — Z9889 Other specified postprocedural states: Secondary | ICD-10-CM

## 2020-02-22 NOTE — Progress Notes (Signed)
She presents today for follow-up of her postop visit date of surgery February 03, 2020 flexor tenotomies and hammertoe repair #3 #4 the left foot.  She states that she is doing well.  Objective: Surgical foot appears to be doing very well margins are well coapted sutures were removed today margins remain well coapted.  Assessment: Well-healing surgical foot.  Plan: Sutures removed today margins remain well coapted I demonstrated to her how to wrap her toes on a daily basis she will continue the use of the [place] shoes and I will follow-up with her in about 2 to 3 weeks.

## 2020-03-06 ENCOUNTER — Encounter: Payer: Self-pay | Admitting: Podiatry

## 2020-03-06 ENCOUNTER — Other Ambulatory Visit: Payer: Self-pay

## 2020-03-06 ENCOUNTER — Ambulatory Visit (INDEPENDENT_AMBULATORY_CARE_PROVIDER_SITE_OTHER): Payer: PPO

## 2020-03-06 ENCOUNTER — Ambulatory Visit (INDEPENDENT_AMBULATORY_CARE_PROVIDER_SITE_OTHER): Payer: PPO | Admitting: Podiatry

## 2020-03-06 DIAGNOSIS — M2042 Other hammer toe(s) (acquired), left foot: Secondary | ICD-10-CM

## 2020-03-06 DIAGNOSIS — Z9889 Other specified postprocedural states: Secondary | ICD-10-CM

## 2020-03-06 DIAGNOSIS — M6240 Contracture of muscle, unspecified site: Secondary | ICD-10-CM

## 2020-03-06 NOTE — Progress Notes (Signed)
She presents today for follow-up of her single tenotomy flexor second toe as well as hammertoe repair third and fourth left.  She states that they are feeling good the fourth toes a little swollen because I was on it a lot yesterday.  She denies fever chills nausea vomiting muscle aches pains calf pain back pain chest pain shortness of breath.  Objective: Vital signs are stable alert and oriented x3/4 toe is a little bit swollen.  Mild erythema to the fourth toe no cellulitis drainage or odor.  Radiographs demonstrate well healing surgical toes.  Assessment: Well-healing surgical toes.  Plan: Continue to compress daily with a compressive wrap continue to wear the Darco shoe for about another 2 weeks then try to progress to a regular tennis shoe and less painful.  We will follow-up with her BAKO results next visit.  1 month return.

## 2020-03-19 ENCOUNTER — Telehealth: Payer: Self-pay | Admitting: *Deleted

## 2020-03-19 NOTE — Telephone Encounter (Signed)
I informed pt of Dr. Stephenie Acres review of results and orders. Pt states she will see him tomorrow.

## 2020-03-19 NOTE — Telephone Encounter (Signed)
-----   Message from Garrel Ridgel, [place] sent at 03/19/2020  8:51 AM EDT ----- Positive for fungus - keep follow up appt

## 2020-03-20 ENCOUNTER — Other Ambulatory Visit: Payer: Self-pay

## 2020-03-20 ENCOUNTER — Encounter: Payer: Self-pay | Admitting: Podiatry

## 2020-03-20 ENCOUNTER — Ambulatory Visit (INDEPENDENT_AMBULATORY_CARE_PROVIDER_SITE_OTHER): Payer: PPO | Admitting: Podiatry

## 2020-03-20 DIAGNOSIS — Z79899 Other long term (current) drug therapy: Secondary | ICD-10-CM

## 2020-03-20 DIAGNOSIS — M2042 Other hammer toe(s) (acquired), left foot: Secondary | ICD-10-CM

## 2020-03-20 DIAGNOSIS — Z9889 Other specified postprocedural states: Secondary | ICD-10-CM

## 2020-03-20 DIAGNOSIS — L603 Nail dystrophy: Secondary | ICD-10-CM

## 2020-03-20 DIAGNOSIS — M6240 Contracture of muscle, unspecified site: Secondary | ICD-10-CM

## 2020-03-20 LAB — HEPATIC FUNCTION PANEL
AG Ratio: 2.3 (calc) (ref 1.0–2.5)
ALT: 16 U/L (ref 6–29)
AST: 20 U/L (ref 10–35)
Albumin: 4.2 g/dL (ref 3.6–5.1)
Alkaline phosphatase (APISO): 83 U/L (ref 37–153)
Bilirubin, Direct: 0.1 mg/dL (ref 0.0–0.2)
Globulin: 1.8 g/dL (calc) — ABNORMAL LOW (ref 1.9–3.7)
Indirect Bilirubin: 0.3 mg/dL (calc) (ref 0.2–1.2)
Total Bilirubin: 0.4 mg/dL (ref 0.2–1.2)
Total Protein: 6 g/dL — ABNORMAL LOW (ref 6.1–8.1)

## 2020-03-20 MED ORDER — TERBINAFINE HCL 250 MG PO TABS: 250.0000 mg | ORAL_TABLET | Freq: Every day | ORAL | 0 refills | Status: DC

## 2020-03-20 NOTE — Progress Notes (Signed)
She presents today for follow-up of her flexor tenotomy to the second toe of the left foot and hammertoe repair toes #3 #4 the left foot.  States that they are feeling pretty good still little swollen.  She is also here today for her pathology of her toenails.  Objective: Vital signs are stable alert oriented x3 toes are still mildly swollen #3 #4 left foot with some blood that appears to be growing out from beneath the toenail of the third toe left.  Otherwise the toes appear to be doing quite well.  Pathology results do demonstrate saprophytic fungus.  Assessment: Saprophytic fungus well-healing surgical toes 2 through 4 left foot.  Plan: Discussed etiology pathology and surgical therapies at this point we discussed in great detail today oral therapy versus laser therapy and completely discounted topical therapy because of the organism.  She understands the pros and cons of the medication.  She would like to go ahead and have her liver tests done to make sure that she is doing well with that since she just started her Lipitor.  I am also going to go ahead and get her started on day terbinafine expressing to her that is going to only be about 84 to 85% effective with this organism.  She understands and is amendable to it.  I will follow-up with her in 1 month for set of x-rays of the left foot and also to discuss another liver profile and continuation of her Lamisil.  Should she have questions or concerns she will notify us immediately.  I will follow-up with her should her blood work come back abnormal.

## 2020-03-23 ENCOUNTER — Telehealth: Payer: Self-pay | Admitting: *Deleted

## 2020-03-23 MED ORDER — TERBINAFINE HCL 250 MG PO TABS: 250.0000 mg | ORAL_TABLET | Freq: Every day | ORAL | 0 refills | Status: DC

## 2020-03-23 NOTE — Telephone Encounter (Signed)
-----   Message from Garrel Ridgel, [place] sent at 03/22/2020  7:40 AM EDT ----- Blood work looks good and may continue medication.

## 2020-03-23 NOTE — Telephone Encounter (Signed)
Left message informing pt of Dr. Hyatt's review of results and orders. 

## 2020-04-26 ENCOUNTER — Other Ambulatory Visit: Payer: Self-pay

## 2020-04-26 ENCOUNTER — Ambulatory Visit (INDEPENDENT_AMBULATORY_CARE_PROVIDER_SITE_OTHER): Payer: PPO | Admitting: Podiatry

## 2020-04-26 ENCOUNTER — Ambulatory Visit (INDEPENDENT_AMBULATORY_CARE_PROVIDER_SITE_OTHER): Payer: PPO

## 2020-04-26 DIAGNOSIS — Z79899 Other long term (current) drug therapy: Secondary | ICD-10-CM | POA: Diagnosis not present

## 2020-04-26 DIAGNOSIS — M6240 Contracture of muscle, unspecified site: Secondary | ICD-10-CM

## 2020-04-26 DIAGNOSIS — L603 Nail dystrophy: Secondary | ICD-10-CM | POA: Diagnosis not present

## 2020-04-26 DIAGNOSIS — M2042 Other hammer toe(s) (acquired), left foot: Secondary | ICD-10-CM

## 2020-04-26 DIAGNOSIS — Z9889 Other specified postprocedural states: Secondary | ICD-10-CM

## 2020-04-26 MED ORDER — TERBINAFINE HCL 250 MG PO TABS: 250.0000 mg | ORAL_TABLET | Freq: Every day | ORAL | 0 refills | Status: DC

## 2020-04-26 NOTE — Progress Notes (Signed)
She presents today date of surgery 02/03/2020 flexor tenotomy of the second toe hammertoe repair third and fourth with screws.  States that they are doing good no problems whatsoever she is here to follow-up for her nail fungus has taken 1 months worth of Lamisil with no side effects.  Objective: Vital signs are stable alert oriented x3.  Pulses are palpable.  There is no erythema edema cellulitis drainage or odor to the surgical sites.  Everything is going to heal uneventfully radiographs taken today demonstrate well-healed arthrodesis sites.  Screw retention to fourth and third toes left is good.  No change in the nails as of yet.  Assessment: Well-healing surgical toes second third and fourth left long-term therapy with Lamisil for onychomycosis.  Plan: Requesting another liver profile today and I will follow-up with her in 4 months.  When he wrote another prescription for 90 Lamisil tablets.  1 p.o. daily I will follow-up with her in 4 months

## 2020-04-27 LAB — HEPATIC FUNCTION PANEL
AG Ratio: 2 (calc) (ref 1.0–2.5)
ALT: 13 U/L (ref 6–29)
AST: 19 U/L (ref 10–35)
Albumin: 4.2 g/dL (ref 3.6–5.1)
Alkaline phosphatase (APISO): 81 U/L (ref 37–153)
Bilirubin, Direct: 0.1 mg/dL (ref 0.0–0.2)
Globulin: 2.1 g/dL (calc) (ref 1.9–3.7)
Indirect Bilirubin: 0.2 mg/dL (calc) (ref 0.2–1.2)
Total Bilirubin: 0.3 mg/dL (ref 0.2–1.2)
Total Protein: 6.3 g/dL (ref 6.1–8.1)

## 2020-05-01 ENCOUNTER — Telehealth: Payer: Self-pay | Admitting: *Deleted

## 2020-05-01 NOTE — Telephone Encounter (Signed)
I informed pt of Dr. Hyatt's review of results and orders. 

## 2020-05-01 NOTE — Telephone Encounter (Signed)
-----   Message from Garrel Ridgel, [place] sent at 05/01/2020  1:10 PM EDT ----- Blood work looks great may continue medications.

## 2020-06-02 ENCOUNTER — Other Ambulatory Visit: Payer: Self-pay | Admitting: Family Medicine

## 2020-06-02 DIAGNOSIS — F32A Depression, unspecified: Secondary | ICD-10-CM

## 2020-06-02 DIAGNOSIS — G0491 Myelitis, unspecified: Secondary | ICD-10-CM

## 2020-06-09 ENCOUNTER — Other Ambulatory Visit: Payer: Self-pay | Admitting: Family Medicine

## 2020-06-09 DIAGNOSIS — F419 Anxiety disorder, unspecified: Secondary | ICD-10-CM

## 2020-08-05 ENCOUNTER — Other Ambulatory Visit: Payer: Self-pay | Admitting: Family Medicine

## 2020-08-05 NOTE — Telephone Encounter (Signed)
Requested Prescriptions  Pending Prescriptions Disp Refills  . atorvastatin (LIPITOR) 20 MG tablet [Pharmacy Med Name: ATORVASTATIN 20MG  TABLETS] 90 tablet 1    Sig: TAKE 1 TABLET(20 MG) BY MOUTH DAILY     Cardiovascular:  Antilipid - Statins Failed - 08/05/2020  4:12 PM      Failed - Total Cholesterol in normal range and within 360 days    Cholesterol, Total  Date Value Ref Range Status  01/03/2020 253 (H) 100 - 199 mg/dL Final         Failed - LDL in normal range and within 360 days    LDL Chol Calc (NIH)  Date Value Ref Range Status  01/03/2020 146 (H) 0 - 99 mg/dL Final         Passed - HDL in normal range and within 360 days    HDL  Date Value Ref Range Status  01/03/2020 98 >39 mg/dL Final         Passed - Triglycerides in normal range and within 360 days    Triglycerides  Date Value Ref Range Status  01/03/2020 57 0 - 149 mg/dL Final         Passed - Patient is not pregnant      Passed - Valid encounter within last 12 months    Recent Outpatient Visits          7 months ago Screening for metabolic disorder   [place], Charlane Ferretti, MD   1 year ago Myelitis [facility])   [place], [place], [place]   2 years ago Idiopathic transverse myelitis [facility])   [place] Ramirez, [place], MD   2 years ago Need for 23-polyvalent pneumococcal polysaccharide vaccine   [place], Marlena Clipper, MD   3 years ago Pap smear for cervical cancer screening   [place], Marlena Clipper, MD

## 2020-08-16 ENCOUNTER — Other Ambulatory Visit: Payer: Self-pay | Admitting: Family Medicine

## 2020-08-16 DIAGNOSIS — Z1231 Encounter for screening mammogram for malignant neoplasm of breast: Secondary | ICD-10-CM

## 2020-08-28 ENCOUNTER — Other Ambulatory Visit: Payer: Self-pay

## 2020-08-28 ENCOUNTER — Ambulatory Visit: Payer: PPO | Admitting: Podiatry

## 2020-08-28 DIAGNOSIS — Z79899 Other long term (current) drug therapy: Secondary | ICD-10-CM | POA: Diagnosis not present

## 2020-08-28 DIAGNOSIS — L603 Nail dystrophy: Secondary | ICD-10-CM

## 2020-08-28 MED ORDER — CEPHALEXIN 500 MG PO CAPS
500.0000 mg | ORAL_CAPSULE | Freq: Three times a day (TID) | ORAL | 0 refills | Status: DC
Start: 2020-08-28 — End: 2020-09-11

## 2020-08-28 MED ORDER — TERBINAFINE HCL 250 MG PO TABS: 250.0000 mg | ORAL_TABLET | Freq: Every day | ORAL | 0 refills | Status: DC

## 2020-08-29 NOTE — Progress Notes (Signed)
She presents today for follow-up of her Lamisil therapy states that all my toes are doing great with the exception of the hallux right.  She states that is still quite thickened discolored.  She states that she is completed her 120 days without any problems taking the medicine whatsoever.  She is also complaining of a painful fourth toe of the left foot that is red and swollen.  Objective: Vital signs are stable alert and oriented x3 pulses are palpable bilateral.  Nail plates appear to be growing out very nicely a slow growth on the right hallux though it does appear to be moving out at this time.  She does have erythema to the fourth toe of the left foot at this time this does appear to be a problem with internal fixation just beneath her toenail.  Assessment: Well-healing onychomycosis cellulitis secondary to a wound beneath her nail more than likely associated with the screw fourth toe left foot.  Plan: Start her back on her Lamisil 1 tablet every other day.  Also started her on Keflex 500 mg 1 p.o. 3 times daily consented her for screw removal fourth toe left foot.  She understands this is amenable to it I will follow-up with her in the near future for surgical intervention.  We did discuss the possible postop complications which include but not limited to postop pain bleeding swelling infection recurrence need for further surgery overcorrection under correction our inability to remove the screw in its entirety.  She understands this and is amenable to it she also understands that she may lose the nail plate and have an abnormal nail plate that grows in its place.

## 2020-09-01 ENCOUNTER — Other Ambulatory Visit: Payer: Self-pay | Admitting: Family Medicine

## 2020-09-01 DIAGNOSIS — G0491 Myelitis, unspecified: Secondary | ICD-10-CM

## 2020-09-01 DIAGNOSIS — F419 Anxiety disorder, unspecified: Secondary | ICD-10-CM

## 2020-09-01 NOTE — Telephone Encounter (Signed)
Requested Prescriptions  Pending Prescriptions Disp Refills  . traZODone (DESYREL) 50 MG tablet [Pharmacy Med Name: TRAZODONE 50MG  TABLETS] 30 tablet 0    Sig: TAKE 1 TABLET(50 MG) BY MOUTH AT BEDTIME     Psychiatry: Antidepressants - Serotonin Modulator Failed - 09/01/2020 10:47 AM      Failed - Valid encounter within last 6 months    Recent Outpatient Visits          8 months ago Screening for metabolic disorder   [place] Charlott Rakes, MD   1 year ago Myelitis [facility])   [place], [place], [place]   2 years ago Idiopathic transverse myelitis [facility])   [place], Enobong, MD   3 years ago Need for 23-polyvalent pneumococcal polysaccharide vaccine   [place], Marlena Clipper, MD   3 years ago Pap smear for cervical cancer screening   [place], Marlena Clipper, MD      Future Appointments            In 1 week Charlott Rakes, MD [place] - Completed PHQ-2 or PHQ-9 in the last 360 days.      Marland Kitchen gabapentin (NEURONTIN) 300 MG capsule [Pharmacy Med Name: GABAPENTIN 300MG  CAPSULES] 90 capsule 0    Sig: TAKE 1 CAPSULE(300 MG) BY MOUTH AT BEDTIME     Neurology: Anticonvulsants - gabapentin Passed - 09/01/2020 10:47 AM      Passed - Valid encounter within last 12 months    Recent Outpatient Visits          8 months ago Screening for metabolic disorder   [place] Charlott Rakes, MD   1 year ago Myelitis [facility])   [place], [place], [place]   2 years ago Idiopathic transverse myelitis [facility])   [place], Enobong, MD   3 years ago Need for 23-polyvalent pneumococcal polysaccharide vaccine   [place],  Marlena Clipper, MD   3 years ago Pap smear for cervical cancer screening   [place], MD      Future Appointments            In 1 week Charlott Rakes, MD [place]

## 2020-09-04 ENCOUNTER — Other Ambulatory Visit: Payer: Self-pay

## 2020-09-04 ENCOUNTER — Ambulatory Visit
Admission: RE | Admit: 2020-09-04 | Discharge: 2020-09-04 | Disposition: A | Discharge status: Home / Self Care | Payer: PPO | Source: Ambulatory Visit | Attending: Family Medicine | Admitting: Family Medicine

## 2020-09-04 DIAGNOSIS — Z1231 Encounter for screening mammogram for malignant neoplasm of breast: Secondary | ICD-10-CM

## 2020-09-08 ENCOUNTER — Other Ambulatory Visit: Payer: Self-pay | Admitting: Family Medicine

## 2020-09-08 DIAGNOSIS — F419 Anxiety disorder, unspecified: Secondary | ICD-10-CM

## 2020-09-08 NOTE — Telephone Encounter (Signed)
Requested Prescriptions  Pending Prescriptions Disp Refills  . sertraline (ZOLOFT) 25 MG tablet [Pharmacy Med Name: SERTRALINE 25MG  TABLETS] 30 tablet 0    Sig: TAKE 1 TABLET(25 MG) BY MOUTH DAILY     Psychiatry:  Antidepressants - SSRI Failed - 09/08/2020 10:50 AM      Failed - Valid encounter within last 6 months    Recent Outpatient Visits          8 months ago Screening for metabolic disorder   [place] Charlott Rakes, MD   1 year ago Myelitis [facility])   [place], [place], [place]   2 years ago Idiopathic transverse myelitis [facility])   [place], Enobong, MD   3 years ago Need for 23-polyvalent pneumococcal polysaccharide vaccine   [employer] [place], Marlena Clipper, MD   3 years ago Pap smear for cervical cancer screening   [place], Marlena Clipper, MD      Future Appointments            In 3 days Charlott Rakes, MD [place] - Completed PHQ-2 or PHQ-9 in the last 360 days.

## 2020-09-11 ENCOUNTER — Ambulatory Visit: Payer: PPO | Attending: Family Medicine | Admitting: Family Medicine

## 2020-09-11 ENCOUNTER — Other Ambulatory Visit: Payer: Self-pay | Admitting: Family Medicine

## 2020-09-11 ENCOUNTER — Encounter: Payer: Self-pay | Admitting: Family Medicine

## 2020-09-11 ENCOUNTER — Other Ambulatory Visit: Payer: Self-pay

## 2020-09-11 VITALS — BP 101/63 | HR 87 | Temp 98.0°F | Ht 67.0 in | Wt 136.4 lb

## 2020-09-11 DIAGNOSIS — E7800 Pure hypercholesterolemia, unspecified: Secondary | ICD-10-CM

## 2020-09-11 DIAGNOSIS — M62838 Other muscle spasm: Secondary | ICD-10-CM

## 2020-09-11 DIAGNOSIS — F329 Major depressive disorder, single episode, unspecified: Secondary | ICD-10-CM

## 2020-09-11 DIAGNOSIS — F419 Anxiety disorder, unspecified: Secondary | ICD-10-CM

## 2020-09-11 DIAGNOSIS — G0491 Myelitis, unspecified: Secondary | ICD-10-CM

## 2020-09-11 MED ORDER — TRAZODONE HCL 50 MG PO TABS: ORAL_TABLET | ORAL | 1 refills | Status: DC

## 2020-09-11 MED ORDER — TIZANIDINE HCL 4 MG PO TABS: 4.0000 mg | ORAL_TABLET | Freq: Three times a day (TID) | ORAL | 1 refills | Status: DC | PRN

## 2020-09-11 MED ORDER — SERTRALINE HCL 25 MG PO TABS: 25.0000 mg | ORAL_TABLET | Freq: Every day | ORAL | 1 refills | Status: DC

## 2020-09-11 NOTE — Progress Notes (Signed)
States that she gets real bad muscle spasms and is requesting medication to help.

## 2020-09-11 NOTE — Patient Instructions (Signed)
Muscle Cramps and Spasms Muscle cramps and spasms are when muscles tighten by themselves. They usually get better within minutes. Muscle cramps are painful. They are usually stronger and last longer than muscle spasms. Muscle spasms may or may not be painful. They can last a few seconds or much longer. Cramps and spasms can affect any muscle, but they occur most often in the calf muscles of the leg. They are usually not caused by a serious problem. In many cases, the cause is not known. Some common causes include:  Doing more physical work or exercise than your body is ready for.  Using the muscles too much (overuse) by repeating certain movements too many times.  Staying in a certain position for a long time.  Playing a sport or doing an activity without preparing properly.  Using bad form or technique while playing a sport or doing an activity.  Not having enough water in your body (dehydration).  Injury.  Side effects of some medicines.  Low levels of the salts and minerals in your blood (electrolytes), such as low potassium or calcium. Follow these instructions at home: Managing pain and stiffness      Massage, stretch, and relax the muscle. Do this for many minutes at a time.  If told, put heat on tight or tense muscles as often as told by your doctor. Use the heat source that your doctor recommends, such as a moist heat pack or a heating pad. ? Place a towel between your skin and the heat source. ? Leave the heat on for 20-30 minutes. ? Remove the heat if your skin turns bright red. This is very important if you are not able to feel pain, heat, or cold. You may have a greater risk of getting burned.  If told, put ice on the affected area. This may help if you are sore or have pain after a cramp or spasm. ? Put ice in a plastic bag. ? Place a towel between your skin and the bag. ? Leave the ice on for 20 minutes, 2-3 times a day.  Try taking hot showers or baths to help  relax tight muscles. Eating and drinking  Drink enough fluid to keep your pee (urine) pale yellow.  Eat a healthy diet to help ensure that your muscles work well. This should include: ? Fruits and vegetables. ? Lean protein. ? Whole grains. ? Low-fat or nonfat dairy products. General instructions  If you are having cramps often, avoid intense exercise for several days.  Take over-the-counter and prescription medicines only as told by your doctor.  Watch for any changes in your symptoms.  Keep all follow-up visits as told by your doctor. This is important. Contact a doctor if:  Your cramps or spasms get worse or happen more often.  Your cramps or spasms do not get better with time. Summary  Muscle cramps and spasms are when muscles tighten by themselves. They usually get better within minutes.  Cramps and spasms occur most often in the calf muscles of the leg.  Massage, stretch, and relax the muscle. This may help the cramp or spasm go away.  Drink enough fluid to keep your pee (urine) pale yellow. This information is not intended to replace advice given to you by your health care provider. Make sure you discuss any questions you have with your health care provider. Document Revised: 04/26/2018 Document Reviewed: 04/26/2018 Elsevier Patient Education  2020 Elsevier Inc.  

## 2020-09-11 NOTE — Progress Notes (Signed)
Subjective:  Patient ID: Gail Gilbert, female    DOB: 11-13-1951  Age: 69 y.o. MRN: [id number]  CC: No chief complaint on file.   HPI Gail Gilbert is a 69 year old female with a history of anxiety and depression, idiopathic transverse myelitis seen today for follow-up visit. She has been having intermittent muscle spasms and recalls after a long trip the spasms developed in her R leg but every once in a while her R hand and R foot will spasm and she will have to walk around or use her other hand to straighten out her fingers. She is attributing her symptoms to her transverse myelitis for which she has chronic neuropathy and remains on gabapentin which has been effective bringing down her numbness to about 5/10.  She takes Lipitor every other day and when asked why she does this rather than taking it every day she states she does not like to take too many medications.  Lipitor was initiated in 12/2019 after her lipid panel came back elevated. She remains on Zoloft for anxiety and depression and is on trazodone for insomnia. Past Medical History:  Diagnosis Date  . Cataract, immature   . Dental crowns present   . GERD (gastroesophageal reflux disease)   . Hammer toe of right foot 07/2016   2nd and 3rd toes  . IBS (irritable bowel syndrome)    no current problems  . Idiopathic transverse myelitis ([place]) 2014   residual numbness feet and right small finger  . Osteoarthritis    cervical spine, lumbar spine  . RLS (restless legs syndrome)    no current med.    Past Surgical History:  Procedure Laterality Date  . BUNIONECTOMY Bilateral   . COLONOSCOPY    . COLONOSCOPY WITH PROPOFOL  11/26/2015  . FOOT SURGERY    . HAMMER TOE SURGERY  10/29/2011   Procedure: HAMMER TOE CORRECTION;  Surgeon: Kerin [place];  Location: [place];  Service: Orthopedics;  Laterality: Left;  Left 2nd toe duvries arthroplasty  . HAMMER TOE SURGERY Right 07/23/2016   Procedure: Schaller;  Surgeon: Frederik Pear, MD;  Location: [facility];  Service: Orthopedics;  Laterality: Right;  . MIDDLE EAR SURGERY     to relieve ringing in ear  . POLYPECTOMY    . SYNOVIAL CYST EXCISION     lumbar back    Family History  Problem Relation Age of Onset  . Arthritis Mother        spinal stenosis  . Cataracts Mother   . Heart disease Mother        pacemaker  . Hypertension Father   . Heart disease Father        Quarry manager  . Congestive Heart Failure Father     Allergies  Allergen Reactions  . Antihistamines, Diphenhydramine-Type Palpitations  . Omeprazole     Joint pain, flu-like symptoms  . Pseudoephedrine Hcl Other (See Comments)    Outpatient Medications Prior to Visit  Medication Sig Dispense Refill  . acetaminophen (TYLENOL) 500 MG tablet Take 500 mg by mouth every 6 (six) hours as needed.    Marland Kitchen atorvastatin (LIPITOR) 20 MG tablet TAKE 1 TABLET(20 MG) BY MOUTH DAILY 90 tablet 1  . gabapentin (NEURONTIN) 300 MG capsule TAKE 1 CAPSULE(300 MG) BY MOUTH AT BEDTIME 90 capsule 0  . terbinafine (LAMISIL) 250 MG tablet Take 1 tablet (250 mg total) by mouth daily. 30 tablet 0  . cephALEXin (KEFLEX) 500  MG capsule Take 1 capsule (500 mg total) by mouth 3 (three) times daily. 30 capsule 0  . sertraline (ZOLOFT) 25 MG tablet TAKE 1 TABLET(25 MG) BY MOUTH DAILY 30 tablet 0  . traZODone (DESYREL) 50 MG tablet TAKE 1 TABLET(50 MG) BY MOUTH AT BEDTIME 30 tablet 0   No facility-administered medications prior to visit.     ROS Review of Systems  Constitutional: Negative for activity change, appetite change and fatigue.  HENT: Negative for congestion, sinus pressure and sore throat.   Eyes: Negative for visual disturbance.  Respiratory: Negative for cough, chest tightness, shortness of breath and wheezing.   Cardiovascular: Negative for chest pain and palpitations.  Gastrointestinal: Negative for abdominal distention, abdominal  pain and constipation.  Endocrine: Negative for polydipsia.  Genitourinary: Negative for dysuria and frequency.  Musculoskeletal:       See HPI  Skin: Negative for rash.  Neurological: Positive for numbness. Negative for tremors and light-headedness.  Hematological: Does not bruise/bleed easily.  Psychiatric/Behavioral: Negative for agitation and behavioral problems.    Objective:  BP 101/63   Pulse 87   Temp 98 F (36.7 C) (Oral)   Ht _0  (1.702 m)   Wt 136 lb 6.4 oz (61.9 kg)   SpO2 97%   BMI 21.36 kg/m   BP/Weight 09/11/2020 02/21/2020 5/80/9983  Systolic BP 382 97 -  Diastolic BP 63 64 -  Wt. (Lbs) 136.4 - 140  BMI 21.36 - 21.93      Physical Exam Constitutional:      Appearance: She is well-developed.  Neck:     Vascular: No JVD.  Cardiovascular:     Rate and Rhythm: Normal rate.     Heart sounds: Normal heart sounds. No murmur heard.   Pulmonary:     Effort: Pulmonary effort is normal.     Breath sounds: Normal breath sounds. No wheezing or rales.  Chest:     Chest wall: No tenderness.  Abdominal:     General: Bowel sounds are normal. There is no distension.     Palpations: Abdomen is soft. There is no mass.     Tenderness: There is no abdominal tenderness.  Musculoskeletal:        General: Normal range of motion.     Right lower leg: No edema.     Left lower leg: No edema.     Comments: Negative straight leg raise bilaterally  Neurological:     Mental Status: She is alert and oriented to person, place, and time.  Psychiatric:        Mood and Affect: Mood normal.     CMP Latest Ref Rng & Units 04/26/2020 03/20/2020 01/03/2020  Glucose 65 - 99 mg/dL - - 87  BUN 8 - 27 mg/dL - - 18  Creatinine 0.57 - 1.00 mg/dL - - 0.74  Sodium 134 - 144 mmol/L - - 140  Potassium 3.5 - 5.2 mmol/L - - 4.3  Chloride 96 - 106 mmol/L - - 97  CO2 20 - 29 mmol/L - - 25  Calcium 8.7 - 10.3 mg/dL - - 9.6  Total Protein 6.1 - 8.1 g/dL 6.3 6.0(L) 6.4  Total Bilirubin 0.2 -  1.2 mg/dL 0.3 0.4 0.4  Alkaline Phos 39 - 117 IU/L - - 95  AST 10 - 35 U/L _1 ALT 6 - 29 U/L _2 Lipid Panel     Component Value Date/Time   CHOL 253 (H) 01/03/2020 1011  TRIG 57 01/03/2020 1011   HDL 98 01/03/2020 1011   CHOLHDL 2.6 01/03/2020 1011   CHOLHDL 2.7 12/24/2016 1102   VLDL 14 12/24/2016 1102   LDLCALC 146 (H) 01/03/2020 1011    CBC    Component Value Date/Time   WBC 6.1 09/17/2015 1040   RBC 4.47 09/17/2015 1040   HGB 13.7 09/17/2015 1040   HCT 40.9 09/17/2015 1040   PLT 304 09/17/2015 1040   MCV 91.5 09/17/2015 1040   MCH 30.6 09/17/2015 1040   MCHC 33.5 09/17/2015 1040   RDW 13.1 09/17/2015 1040   LYMPHSABS 1.8 09/17/2015 1040   MONOABS 0.5 09/17/2015 1040   EOSABS 0.3 09/17/2015 1040   BASOSABS 0.1 09/17/2015 1040    Lab Results  Component Value Date   HGBA1C 5.40 09/17/2015    Assessment & Plan:  1. Muscle spasm Uncontrolled Symptoms could be related to statin use Advised to keep a symptom diary for the next 2 weeks after which she will hold off on Lipitor for the following 2 weeks and if spasms resolve she will notify me for additional management options Meanwhile I have commenced tizanidine and advised her to stay hydrated We will check for electrolyte abnormalities - tiZANidine (ZANAFLEX) 4 MG tablet; Take 1 tablet (4 mg total) by mouth every 8 (eight) hours as needed for muscle spasms.  Dispense: 60 tablet; Refill: 1 - CMP14+EGFR  2. Anxiety and depression Stable - sertraline (ZOLOFT) 25 MG tablet; Take 1 tablet (25 mg total) by mouth daily.  Dispense: 90 tablet; Refill: 1 - traZODone (DESYREL) 50 MG tablet; TAKE 1 TABLET(50 MG) BY MOUTH AT BEDTIME  Dispense: 90 tablet; Refill: 1  3. Myelitis ([place]) With residual neuropathy Continue gabapentin  4. Pure hypercholesterolemia Uncontrolled Currently on Lipitor which she has been taking every other day We will need to exclude statin myopathy Low-cholesterol diet Lipid  panel at next visit.    Meds ordered this encounter  Medications  . tiZANidine (ZANAFLEX) 4 MG tablet    Sig: Take 1 tablet (4 mg total) by mouth every 8 (eight) hours as needed for muscle spasms.    Dispense:  60 tablet    Refill:  1  . sertraline (ZOLOFT) 25 MG tablet    Sig: Take 1 tablet (25 mg total) by mouth daily.    Dispense:  90 tablet    Refill:  1  . traZODone (DESYREL) 50 MG tablet    Sig: TAKE 1 TABLET(50 MG) BY MOUTH AT BEDTIME    Dispense:  90 tablet    Refill:  1    Follow-up: Return in about 6 months (around 03/11/2021) for chronic disease management.       Charlott Rakes, MD, FAAFP. [facility] [place], [place]   09/11/2020, 11:39 AM

## 2020-09-12 LAB — CMP14+EGFR
ALT: 17 IU/L (ref 0–32)
AST: 21 IU/L (ref 0–40)
Albumin/Globulin Ratio: 2 (ref 1.2–2.2)
Albumin: 4.3 g/dL (ref 3.8–4.8)
Alkaline Phosphatase: 100 IU/L (ref 44–121)
BUN/Creatinine Ratio: 27 (ref 12–28)
BUN: 17 mg/dL (ref 8–27)
Bilirubin Total: <0.2 mg/dL (ref 0.0–1.2)
CO2: 24 mmol/L (ref 20–29)
Calcium: 9.2 mg/dL (ref 8.7–10.3)
Chloride: 100 mmol/L (ref 96–106)
Creatinine, Ser: 0.62 mg/dL (ref 0.57–1.00)
GFR calc Af Amer: 107 mL/min/{1.73_m2} (ref >59)
GFR calc non Af Amer: 93 mL/min/{1.73_m2} (ref >59)
Globulin, Total: 2.1 g/dL (ref 1.5–4.5)
Glucose: 85 mg/dL (ref 65–99)
Potassium: 4.8 mmol/L (ref 3.5–5.2)
Sodium: 137 mmol/L (ref 134–144)
Total Protein: 6.4 g/dL (ref 6.0–8.5)

## 2020-09-15 ENCOUNTER — Other Ambulatory Visit: Payer: Self-pay | Admitting: Family Medicine

## 2020-09-15 DIAGNOSIS — F419 Anxiety disorder, unspecified: Secondary | ICD-10-CM

## 2020-09-15 DIAGNOSIS — F32A Depression, unspecified: Secondary | ICD-10-CM

## 2020-09-19 ENCOUNTER — Telehealth: Payer: Self-pay

## 2020-09-19 NOTE — Telephone Encounter (Signed)
DOS 10/12/2020  REMOVAL FIXATION DEEP LT - 20680  RECEIVED AUTHORIZATION FAX FROM [employer].  AUTH # [id number] GOOD FROM 10/12/2020 - 01/10/2021

## 2020-10-11 ENCOUNTER — Other Ambulatory Visit: Payer: Self-pay | Admitting: Podiatry

## 2020-10-11 MED ORDER — ONDANSETRON HCL 4 MG PO TABS: 4.0000 mg | ORAL_TABLET | Freq: Three times a day (TID) | ORAL | 0 refills | Status: DC | PRN

## 2020-10-11 MED ORDER — CEPHALEXIN 500 MG PO CAPS: 500.0000 mg | ORAL_CAPSULE | Freq: Three times a day (TID) | ORAL | 0 refills | Status: DC

## 2020-10-11 MED ORDER — HYDROCODONE-ACETAMINOPHEN 10-325 MG PO TABS
1.0000 | ORAL_TABLET | Freq: Four times a day (QID) | ORAL | 0 refills | Status: DC | PRN
Start: 2020-10-11 — End: 2020-10-18

## 2020-10-12 DIAGNOSIS — Z4889 Encounter for other specified surgical aftercare: Secondary | ICD-10-CM | POA: Diagnosis not present

## 2020-10-12 DIAGNOSIS — L601 Onycholysis: Secondary | ICD-10-CM | POA: Diagnosis not present

## 2020-10-12 DIAGNOSIS — T8484XA Pain due to internal orthopedic prosthetic devices, implants and grafts, initial encounter: Secondary | ICD-10-CM | POA: Diagnosis not present

## 2020-10-18 ENCOUNTER — Ambulatory Visit (INDEPENDENT_AMBULATORY_CARE_PROVIDER_SITE_OTHER): Payer: PPO

## 2020-10-18 ENCOUNTER — Encounter: Payer: Self-pay | Admitting: Podiatry

## 2020-10-18 ENCOUNTER — Ambulatory Visit (INDEPENDENT_AMBULATORY_CARE_PROVIDER_SITE_OTHER): Payer: PPO | Admitting: Podiatry

## 2020-10-18 ENCOUNTER — Other Ambulatory Visit: Payer: Self-pay

## 2020-10-18 DIAGNOSIS — T8460XA Infection and inflammatory reaction due to internal fixation device of unspecified site, initial encounter: Secondary | ICD-10-CM

## 2020-10-18 DIAGNOSIS — Z9889 Other specified postprocedural states: Secondary | ICD-10-CM

## 2020-10-18 NOTE — Progress Notes (Signed)
She presents today for her first postop visit date of surgery 10/12/2020 removal screw third digit left foot.  States that it really has not been bad at all denies fever chills nausea vomiting muscle aches pains calf pain back pain chest pain shortness of breath.  Objective: Vital signs are stable alert oriented x3 dressed her dressing intact was removed demonstrates sutures are intact margins well coapted toe looks much better appears to be clearing from its infection.  Assessment: Well-healing surgical foot.  Plan: Redressed today dressed a compressive dressing follow-up with her in 1 week for suture removal prior to she goes to [place].

## 2020-10-25 ENCOUNTER — Ambulatory Visit (INDEPENDENT_AMBULATORY_CARE_PROVIDER_SITE_OTHER): Payer: PPO | Admitting: Podiatry

## 2020-10-25 ENCOUNTER — Encounter: Payer: Self-pay | Admitting: Podiatry

## 2020-10-25 ENCOUNTER — Other Ambulatory Visit: Payer: Self-pay

## 2020-10-25 DIAGNOSIS — Z9889 Other specified postprocedural states: Secondary | ICD-10-CM

## 2020-10-25 DIAGNOSIS — T8460XA Infection and inflammatory reaction due to internal fixation device of unspecified site, initial encounter: Secondary | ICD-10-CM

## 2020-10-27 NOTE — Progress Notes (Signed)
She presents today date of surgery 10/12/2020 status post removal screw third digit left foot.  She states that she is doing fine no problems whatsoever.  Objective: Vital signs are stable she is alert and oriented x3.  Surgical site appears to be healing very nicely margins well coapted sutures are in place the toe is nonerythematous.  There is no pain.  Assessment: Well-healing surgical toe fourth left.  Plan: Remove all of the sutures today redressed with compressive dressing we will let her start getting this wet I like to follow-up with her when she returns from her trip.

## 2020-11-06 ENCOUNTER — Encounter: Payer: PPO | Admitting: Podiatry

## 2020-11-20 ENCOUNTER — Encounter: Payer: Self-pay | Admitting: Podiatry

## 2020-11-20 ENCOUNTER — Other Ambulatory Visit: Payer: Self-pay

## 2020-11-20 ENCOUNTER — Ambulatory Visit (INDEPENDENT_AMBULATORY_CARE_PROVIDER_SITE_OTHER): Payer: PPO | Admitting: Podiatry

## 2020-11-20 DIAGNOSIS — T8460XA Infection and inflammatory reaction due to internal fixation device of unspecified site, initial encounter: Secondary | ICD-10-CM

## 2020-11-20 DIAGNOSIS — Z9889 Other specified postprocedural states: Secondary | ICD-10-CM

## 2020-11-20 NOTE — Progress Notes (Signed)
She presents today date of surgery 10/12/2020 removal fixation deep K wire states that it feels much better.  Objective: Vital signs stable alert oriented x3 there is no erythema edema cellulitis drainage or odor.  She has tenderness along the medial incurvated nail margin of the hallux right.  No purulence no malodor.  Assessment: Well-healing surgical toe left ingrown toenail hallux right tibial border.  Plan: He is going to make an appointment with front desk to have that matrixectomy performed tibial border hallux right and we will follow-up with her surgical toe as needed.

## 2021-02-11 ENCOUNTER — Other Ambulatory Visit: Payer: Self-pay | Admitting: Family Medicine

## 2021-02-11 DIAGNOSIS — G0491 Myelitis, unspecified: Secondary | ICD-10-CM

## 2021-02-11 NOTE — Telephone Encounter (Signed)
Requested Prescriptions  Pending Prescriptions Disp Refills  . gabapentin (NEURONTIN) 300 MG capsule [Pharmacy Med Name: GABAPENTIN 300MG  CAPSULES] 90 capsule 0    Sig: TAKE 1 CAPSULE(300 MG) BY MOUTH AT BEDTIME     Neurology: Anticonvulsants - gabapentin Passed - 02/11/2021 10:27 AM      Passed - Valid encounter within last 12 months    Recent Outpatient Visits          5 months ago Muscle spasm   [place], Enobong, MD   1 year ago Screening for metabolic disorder   [place] Charlott Rakes, MD   2 years ago Myelitis [facility])   [place], [place], [place]   2 years ago Idiopathic transverse myelitis [facility])   [place], Enobong, MD   3 years ago Need for 23-polyvalent pneumococcal polysaccharide vaccine   [place], Marlena Clipper, MD

## 2021-02-14 ENCOUNTER — Other Ambulatory Visit: Payer: Self-pay | Admitting: Family Medicine

## 2021-02-14 DIAGNOSIS — G0491 Myelitis, unspecified: Secondary | ICD-10-CM

## 2021-03-05 ENCOUNTER — Encounter: Payer: Self-pay | Admitting: Nurse Practitioner

## 2021-03-05 ENCOUNTER — Other Ambulatory Visit: Payer: Self-pay

## 2021-03-05 ENCOUNTER — Ambulatory Visit: Payer: Medicare HMO | Admitting: Nurse Practitioner

## 2021-03-05 VITALS — BP 102/68 | HR 76 | Ht 67.0 in | Wt 141.6 lb

## 2021-03-05 DIAGNOSIS — R1030 Lower abdominal pain, unspecified: Secondary | ICD-10-CM | POA: Diagnosis not present

## 2021-03-05 DIAGNOSIS — R1010 Upper abdominal pain, unspecified: Secondary | ICD-10-CM

## 2021-03-05 MED ORDER — FAMOTIDINE 20 MG PO TABS: 20.0000 mg | ORAL_TABLET | Freq: Every day | ORAL | 5 refills | Status: DC

## 2021-03-05 NOTE — Patient Instructions (Signed)
We have sent the following medications to your pharmacy for you to pick up at your convenience: Famotidine 20 mg - take one daily  If you are age 69 or older, your body mass index should be between 23-30. Your Body mass index is 22.18 kg/m. If this is out of the aforementioned range listed, please consider follow up with your Primary Care Provider.  If you are age 32 or younger, your body mass index should be between 19-25. Your Body mass index is 22.18 kg/m. If this is out of the aformentioned range listed, please consider follow up with your Primary Care Provider.   Due to recent changes in healthcare laws, you may see the results of your imaging and laboratory studies on MyChart before your provider has had a chance to review them.  We understand that in some cases there may be results that are confusing or concerning to you. Not all laboratory results come back in the same time frame and the provider may be waiting for multiple results in order to interpret others.  Please give [place] 48 hours in order for your provider to thoroughly review all the results before contacting the office for clarification of your results.

## 2021-03-05 NOTE — Progress Notes (Signed)
ASSESSMENT AND PLAN    # Intermittent upper abdominal discomfort.  --Omeprazole makes her feel achy like the flu --Trial of famotidine 20 mg q am.  --Continue to avoid NSAIDs.  --Call if discomfort doesn't improve with famotidine  # Lower abdominal discomfort / bloating. Improving with dietary changes.  --Update GYN exam.  --Continue to look for food triggers. --If pain persists or gets worse will consider CT scan.    # Colon cancer screening. She is up to date ( 2016)   HISTORY OF PRESENT ILLNESS    Chief Complaint : anal discomfort / lesion. Lower abdominal discomfort.   Gail Gilbert is a 70 y.o. female known to Dr. Silverio Decamp with past medical history of IBS, GERD, osteoarthritis, restless leg syndrome, chronic neuropathy , hyperlipidemia, anxiety/depression  June 2020-  telemedicine visit follow-up GERD and erosive gastritis.  Patient was feeling better with dietary changes and NSAIDs.  She had stopped omeprazole being concerned about the side effects.  Interval History:  Patient concerned about a growth near anus where there is also "aching". She noticed this within the last 1-2 months. The size of lesion hasn't changed. BMs are normal and she hasn't had any blood in her stools.    Patient mentions that her lower abdomen" just doesn't feel right" sometimes. She just started noticing the discomfort about 1-2 months ago. She feels bloated and symptoms are worse in the evenings after eating.  She has been trying to avoid certain foods just as gluten and diary which has helped her symptoms. Again, her bowel movements are normal . No urinary symptoms. She is overdue for GYN appointment.   Gail Gilbert also gets nonradiating upper abdominal discomfort two -three times a week. Discomfort is not related to eating .  Patient says she feels like the discomfort is similar to when she was diagnosed with gastritis in 2019. No associated nausea or vomiting. Omeprazole makes her feel achy  like she has the flu.  She has not been taking any NSAIDs.  Her weight is stable   Data Review:  Late September 2021 CMP normal   Previous Endoscopic Evaluations / Pertinent Studies:    August 2019 EGD --Erosive gastritis with hemorrhage --Biopsies negative for H. pylori --Normal duodenum and esophagus    Past Medical History:  Diagnosis Date  . Cataract, immature   . Dental crowns present   . GERD (gastroesophageal reflux disease)   . Hammer toe of right foot 07/2016   2nd and 3rd toes  . IBS (irritable bowel syndrome)    no current problems  . Idiopathic transverse myelitis ([place]) 2014   residual numbness feet and right small finger  . Osteoarthritis    cervical spine, lumbar spine  . RLS (restless legs syndrome)    no current med.    Current Medications, Allergies, Past Surgical History, Family History and Social History were reviewed in [employer] record.   Current Outpatient Medications  Medication Sig Dispense Refill  . acetaminophen (TYLENOL) 500 MG tablet Take 500 mg by mouth every 6 (six) hours as needed.    Marland Kitchen atorvastatin (LIPITOR) 20 MG tablet TAKE 1 TABLET(20 MG) BY MOUTH DAILY 90 tablet 1  . gabapentin (NEURONTIN) 300 MG capsule TAKE 1 CAPSULE(300 MG) BY MOUTH AT BEDTIME 90 capsule 0  . sertraline (ZOLOFT) 25 MG tablet Take 1 tablet (25 mg total) by mouth daily. 90 tablet 1  . terbinafine (LAMISIL) 250 MG tablet Take 1 tablet (250 mg  total) by mouth daily. 30 tablet 0  . traZODone (DESYREL) 50 MG tablet TAKE 1 TABLET(50 MG) BY MOUTH AT BEDTIME 90 tablet 1   No current facility-administered medications for this visit.    Review of Systems: No chest pain. No shortness of breath. No urinary complaints.   PHYSICAL EXAM :    Wt Readings from Last 3 Encounters:  09/11/20 136 lb 6.4 oz (61.9 kg)  06/09/19 140 lb (63.5 kg)  06/08/19 140 lb (63.5 kg)    BP 102/68   Pulse 76   Ht 5\' 7"  (1.702 m)   Wt 141 lb 9.6 oz (64.2 kg)   BMI  22.18 kg/m  Constitutional:  Pleasant  thin female in no acute distress. Psychiatric: Normal mood and affect. Behavior is normal. EENT: Pupils normal.  Conjunctivae are normal. No scleral icterus. Neck supple.  Cardiovascular: Normal rate, regular rhythm. No edema Pulmonary/chest: Effort normal and breath sounds normal. No wheezing, rales or rhonchi. Abdominal: Soft, nondistended, nontender. Bowel sounds active throughout. There are no masses palpable. No hepatomegaly. Rectal: three small perianal hemorroid tags Neurological: Alert and oriented to person place and time. Skin: Skin is warm and dry. No rashes noted.  Tye Savoy, NP  03/05/2021, 9:31 AM

## 2021-04-02 ENCOUNTER — Other Ambulatory Visit: Payer: Self-pay | Admitting: Family Medicine

## 2021-04-02 DIAGNOSIS — F32A Depression, unspecified: Secondary | ICD-10-CM

## 2021-04-06 NOTE — Progress Notes (Signed)
Reviewed and agree with documentation and assessment and plan. K. Veena Shantel Helwig , MD   

## 2021-04-11 ENCOUNTER — Other Ambulatory Visit: Payer: Self-pay

## 2021-04-11 ENCOUNTER — Ambulatory Visit: Payer: Medicare HMO | Admitting: Podiatry

## 2021-04-11 DIAGNOSIS — L603 Nail dystrophy: Secondary | ICD-10-CM

## 2021-04-11 NOTE — Progress Notes (Signed)
She presents today states that the right great toe still has a fungus on it even after all the others have cleared up from the Lamisil.  Objective: Vital signs stable alert oriented x3.  Pulses palpable although toenails appear to be clear with exception of the hallux distal medial one quarter of the nail plate.  This does look as if it is a nail dystrophy and is loose from the bed does not not look like is changed ever at all since inception of treatment.  Assessment: Nail dystrophy.  Plan: I discussed with her in great detail she understands that we could resample this but most likely because this not contaminating the skin or have any cutaneous effects most likely it is just nail dystrophy she understands and is amenable to follow-up with hand.

## 2021-04-27 ENCOUNTER — Other Ambulatory Visit: Payer: Self-pay | Admitting: Family Medicine

## 2021-04-27 DIAGNOSIS — F32A Depression, unspecified: Secondary | ICD-10-CM

## 2021-04-27 DIAGNOSIS — F419 Anxiety disorder, unspecified: Secondary | ICD-10-CM

## 2021-04-27 NOTE — Telephone Encounter (Signed)
Last RF: 04/02/21 #30 RF due: yes Active med list: yes Sent pt MyChart message to call office and make appt.

## 2021-04-29 NOTE — Telephone Encounter (Signed)
  Notes to clinic: Patient has appointment on 06/11/2021 Review for refill until that time   Requested Prescriptions  Pending Prescriptions Disp Refills   traZODone (DESYREL) 50 MG tablet [Pharmacy Med Name: TRAZODONE 50MG  TABLETS] 30 tablet 0    Sig: TAKE 1 TABLET(50 MG) BY MOUTH AT BEDTIME      Psychiatry: Antidepressants - Serotonin Modulator Failed - 04/29/2021 12:29 PM      Failed - Valid encounter within last 6 months    Recent Outpatient Visits           7 months ago Muscle spasm   [facility] Charlott Rakes, MD   1 year ago Screening for metabolic disorder   [place], MD   2 years ago Myelitis [facility])   [place], [place], [place]   3 years ago Idiopathic transverse myelitis [facility])   [place], Enobong, MD   3 years ago Need for 23-polyvalent pneumococcal polysaccharide vaccine   [place], Marlena Clipper, MD       Future Appointments             In 1 month Charlott Rakes, MD [place] - Completed PHQ-2 or PHQ-9 in the last 360 days

## 2021-04-29 NOTE — Telephone Encounter (Signed)
Copied from [place] [phone]. Topic: Quick Communication - Rx Refill/Question >> Apr 29, 2021 12:27 PM Leward Quan A wrote: Medication: gabapentin (NEURONTIN) 300 MG capsule   Has the patient contacted their pharmacy? Yes.   (Agent: If no, request that the patient contact the pharmacy for the refill.) (Agent: If yes, when and what did the pharmacy advise?)  Preferred Pharmacy (with phone number or street name): [facility] [place], [place]  Phone:  [phone] Fax:  [phone]     Agent: Please be advised that RX refills may take up to 3 business days. We ask that you follow-up with your pharmacy.

## 2021-05-09 ENCOUNTER — Other Ambulatory Visit: Payer: Self-pay | Admitting: Family Medicine

## 2021-05-09 NOTE — Telephone Encounter (Signed)
Requested Prescriptions  Pending Prescriptions Disp Refills  . atorvastatin (LIPITOR) 20 MG tablet [Pharmacy Med Name: ATORVASTATIN 20MG  TABLETS] 90 tablet 0    Sig: TAKE 1 TABLET(20 MG) BY MOUTH DAILY     Cardiovascular:  Antilipid - Statins Failed - 05/09/2021  3:37 AM      Failed - Total Cholesterol in normal range and within 360 days    Cholesterol, Total  Date Value Ref Range Status  01/03/2020 253 (H) 100 - 199 mg/dL Final         Failed - LDL in normal range and within 360 days    LDL Chol Calc (NIH)  Date Value Ref Range Status  01/03/2020 146 (H) 0 - 99 mg/dL Final         Failed - HDL in normal range and within 360 days    HDL  Date Value Ref Range Status  01/03/2020 98 >39 mg/dL Final         Failed - Triglycerides in normal range and within 360 days    Triglycerides  Date Value Ref Range Status  01/03/2020 57 0 - 149 mg/dL Final         Passed - Patient is not pregnant      Passed - Valid encounter within last 12 months    Recent Outpatient Visits          8 months ago Muscle spasm   [place], Enobong, MD   1 year ago Screening for metabolic disorder   [place], Enobong, MD   2 years ago Myelitis [facility])   [place], [place], [place]   3 years ago Idiopathic transverse myelitis [facility])   [place] , En[place], MD   3 years ago Need for 23-polyvalent pneumococcal polysaccharide vaccine   [place], MD      Future Appointments            In 1 month Charlott Rakes, MD [place]

## 2021-05-10 ENCOUNTER — Other Ambulatory Visit: Payer: Self-pay | Admitting: Family Medicine

## 2021-05-10 DIAGNOSIS — G0491 Myelitis, unspecified: Secondary | ICD-10-CM

## 2021-05-11 ENCOUNTER — Other Ambulatory Visit: Payer: Self-pay | Admitting: Family Medicine

## 2021-05-11 DIAGNOSIS — F419 Anxiety disorder, unspecified: Secondary | ICD-10-CM

## 2021-05-11 DIAGNOSIS — F32A Depression, unspecified: Secondary | ICD-10-CM

## 2021-05-11 NOTE — Telephone Encounter (Signed)
Requested Prescriptions  Pending Prescriptions Disp Refills  . sertraline (ZOLOFT) 25 MG tablet [Pharmacy Med Name: SERTRALINE 25MG  TABLETS] 31 tablet 0    Sig: TAKE 1 TABLET(25 MG) BY MOUTH DAILY     Psychiatry:  Antidepressants - SSRI Failed - 05/11/2021  8:08 AM      Failed - Valid encounter within last 6 months    Recent Outpatient Visits          8 months ago Muscle spasm   [place], Enobong, MD   1 year ago Screening for metabolic disorder   [place] Charlott Rakes, MD   2 years ago Myelitis [facility])   [place], [place], [place]   3 years ago Idiopathic transverse myelitis [facility])   [place], Enobong, MD   3 years ago Need for 23-polyvalent pneumococcal polysaccharide vaccine   [place], Marlena Clipper, MD      Future Appointments            In 1 month Charlott Rakes, MD [place] - Completed PHQ-2 or PHQ-9 in the last 360 days

## 2021-05-25 ENCOUNTER — Other Ambulatory Visit: Payer: Self-pay | Admitting: Family Medicine

## 2021-05-25 DIAGNOSIS — F32A Depression, unspecified: Secondary | ICD-10-CM

## 2021-05-25 NOTE — Telephone Encounter (Signed)
Requested Prescriptions  Pending Prescriptions Disp Refills  . traZODone (DESYREL) 50 MG tablet [Pharmacy Med Name: TRAZODONE 50MG  TABLETS] 17 tablet 0    Sig: TAKE 1 TABLET(50 MG) BY MOUTH AT BEDTIME     Psychiatry: Antidepressants - Serotonin Modulator Failed - 05/25/2021 10:06 AM      Failed - Valid encounter within last 6 months    Recent Outpatient Visits          8 months ago Muscle spasm   [place], Enobong, MD   1 year ago Screening for metabolic disorder   [place] Charlott Rakes, MD   2 years ago Myelitis [facility])   [place], [place], [place]   3 years ago Idiopathic transverse myelitis [facility])   [place], Enobong, MD   3 years ago Need for 23-polyvalent pneumococcal polysaccharide vaccine   [place] Tresa Garter, MD      Future Appointments            In 2 weeks Charlott Rakes, MD [place] - Completed PHQ-2 or PHQ-9 in the last 360 days

## 2021-06-11 ENCOUNTER — Encounter: Payer: Medicare HMO | Admitting: Family Medicine

## 2021-06-12 DIAGNOSIS — H2513 Age-related nuclear cataract, bilateral: Secondary | ICD-10-CM | POA: Diagnosis not present

## 2021-06-12 DIAGNOSIS — H43813 Vitreous degeneration, bilateral: Secondary | ICD-10-CM | POA: Diagnosis not present

## 2021-06-12 DIAGNOSIS — H33321 Round hole, right eye: Secondary | ICD-10-CM | POA: Diagnosis not present

## 2021-06-14 DIAGNOSIS — Z0100 Encounter for examination of eyes and vision without abnormal findings: Secondary | ICD-10-CM | POA: Diagnosis not present

## 2021-06-27 ENCOUNTER — Ambulatory Visit (INDEPENDENT_AMBULATORY_CARE_PROVIDER_SITE_OTHER): Payer: Medicare HMO | Admitting: Nurse Practitioner

## 2021-06-27 VITALS — BP 120/72 | HR 71 | Temp 97.9°F | Resp 18 | Ht 67.0 in | Wt 145.0 lb

## 2021-06-27 DIAGNOSIS — Z0000 Encounter for general adult medical examination without abnormal findings: Secondary | ICD-10-CM | POA: Diagnosis not present

## 2021-06-27 NOTE — Patient Instructions (Addendum)
Gail Gilbert , Thank you for taking time to come for your Medicare Wellness Visit. I appreciate your ongoing commitment to your health goals. Please review the following plan we discussed and let me know if I can assist you in the future.   These are the goals we discussed:  Goals   None     This is a list of the screening recommended for you and due dates:  Health Maintenance  Topic Date Due  . COVID-19 Vaccine (1) Never done  . Pneumonia vaccines (2 of 2 - PPSV23) 06/06/2021  . Flu Shot  07/15/2021  . Mammogram  09/04/2022  . Colon Cancer Screening  11/25/2025  . Tetanus Vaccine  10/29/2027  . DEXA scan (bone density measurement)  Completed  . Hepatitis C Screening: USPSTF Recommendation to screen - Ages 80-79 yo.  Completed  . Zoster (Shingles) Vaccine  Completed  . HPV Vaccine  Aged Out    Fall Prevention in the Home, Adult Falls can cause injuries and can happen to people of all ages. There are many things you can do to make your home safe and to help prevent falls. Ask forhelp when making these changes. What actions can I take to prevent falls? General Instructions Use good lighting in all rooms. Replace any light bulbs that burn out. Turn on the lights in dark areas. Use night-lights. Keep items that you use often in easy-to-reach places. Lower the shelves around your home if needed. Set up your furniture so you have a clear path. Avoid moving your furniture around. Do not have throw rugs or other things on the floor that can make you trip. Avoid walking on wet floors. If any of your floors are uneven, fix them. Add color or contrast paint or tape to clearly mark and help you see: Grab bars or handrails. First and last steps of staircases. Where the edge of each step is. If you use a stepladder: Make sure that it is fully opened. Do not climb a closed stepladder. Make sure the sides of the stepladder are locked in place. Ask someone to hold the stepladder while you use  it. Know where your pets are when moving through your home. What can I do in the bathroom?     Keep the floor dry. Clean up any water on the floor right away. Remove soap buildup in the tub or shower. Use nonskid mats or decals on the floor of the tub or shower. Attach bath mats securely with double-sided, nonslip rug tape. If you need to sit down in the shower, use a plastic, nonslip stool. Install grab bars by the toilet and in the tub and shower. Do not use towel bars as grab bars. What can I do in the bedroom? Make sure that you have a light by your bed that is easy to reach. Do not use any sheets or blankets for your bed that hang to the floor. Have a firm chair with side arms that you can use for support when you get dressed. What can I do in the kitchen? Clean up any spills right away. If you need to reach something above you, use a step stool with a grab bar. Keep electrical cords out of the way. Do not use floor polish or wax that makes floors slippery. What can I do with my stairs? Do not leave any items on the stairs. Make sure that you have a light switch at the top and the bottom of the stairs. Make  sure that there are handrails on both sides of the stairs. Fix handrails that are broken or loose. Install nonslip stair treads on all your stairs. Avoid having throw rugs at the top or bottom of the stairs. Choose a carpet that does not hide the edge of the steps on the stairs. Check carpeting to make sure that it is firmly attached to the stairs. Fix carpet that is loose or worn. What can I do on the outside of my home? Use bright outdoor lighting. Fix the edges of walkways and driveways and fix any cracks. Remove anything that might make you trip as you walk through a door, such as a raised step or threshold. Trim any bushes or trees on paths to your home. Check to see if handrails are loose or broken and that both sides of all steps have handrails. Install guardrails  along the edges of any raised decks and porches. Clear paths of anything that can make you trip, such as tools or rocks. Have leaves, snow, or ice cleared regularly. Use sand or salt on paths during winter. Clean up any spills in your garage right away. This includes grease or oil spills. What other actions can I take? Wear shoes that: Have a low heel. Do not wear high heels. Have rubber bottoms. Feel good on your feet and fit well. Are closed at the toe. Do not wear open-toe sandals. Use tools that help you move around if needed. These include: Canes. Walkers. Scooters. Crutches. Review your medicines with your doctor. Some medicines can make you feel dizzy. This can increase your chance of falling. Ask your doctor what else you can do to help prevent falls. Where to find more information Centers for Disease Control and Prevention, STEAD[employer]: http://www.wolf.info/ National Institute on Aging: http://kim-miller.com/ Contact a doctor if: You are afraid of falling at home. You feel weak, drowsy, or dizzy at home. You fall at home. Summary There are many simple things that you can do to make your home safe and to help prevent falls. Ways to make your home safe include removing things that can make you trip and installing grab bars in the bathroom. Ask for help when making these changes in your home. This information is not intended to replace advice given to you by your health care provider. Make sure you discuss any questions you have with your healthcare provider. Document Revised: 07/04/2020 Document Reviewed: 07/04/2020 Elsevier Patient Education  [place] Maintenance, Female Adopting a healthy lifestyle and getting preventive care are important in promoting health and wellness. Ask your health care provider about: The right schedule for you to have regular tests and exams. Things you can do on your own to prevent diseases and keep yourself healthy. What should I know about diet,  weight, and exercise? Eat a healthy diet  Eat a diet that includes plenty of vegetables, fruits, low-fat dairy products, and lean protein. Do not eat a lot of foods that are high in solid fats, added sugars, or sodium.  Maintain a healthy weight Body mass index (BMI) is used to identify weight problems. It estimates body fat based on height and weight. Your health care provider can help determineyour BMI and help you achieve or maintain a healthy weight. Get regular exercise Get regular exercise. This is one of the most important things you can do for your health. Most adults should: Exercise for at least 150 minutes each week. The exercise should increase your heart rate and make you  sweat (moderate-intensity exercise). Do strengthening exercises at least twice a week. This is in addition to the moderate-intensity exercise. Spend less time sitting. Even light physical activity can be beneficial. Watch cholesterol and blood lipids Have your blood tested for lipids and cholesterol at 69 years of age, then havethis test every 5 years. Have your cholesterol levels checked more often if: Your lipid or cholesterol levels are high. You are older than 69 years of age. You are at high risk for heart disease. What should I know about cancer screening? Depending on your health history and family history, you may need to have cancer screening at various ages. This may include screening for: Breast cancer. Cervical cancer. Colorectal cancer. Skin cancer. Lung cancer. What should I know about heart disease, diabetes, and high blood pressure? Blood pressure and heart disease High blood pressure causes heart disease and increases the risk of stroke. This is more likely to develop in people who have high blood pressure readings, are of African descent, or are overweight. Have your blood pressure checked: Every 3-5 years if you are 59-28 years of age. Every year if you are 22 years old or  older. Diabetes Have regular diabetes screenings. This checks your fasting blood sugar level. Have the screening done: Once every three years after age 70 if you are at a normal weight and have a low risk for diabetes. More often and at a younger age if you are overweight or have a high risk for diabetes. What should I know about preventing infection? Hepatitis B If you have a higher risk for hepatitis B, you should be screened for this virus. Talk with your health care provider to find out if you are at risk forhepatitis B infection. Hepatitis C Testing is recommended for: Everyone born from [birth year] through 1964. Anyone with known risk factors for hepatitis C. Sexually transmitted infections (STIs) Get screened for STIs, including gonorrhea and chlamydia, if: You are sexually active and are younger than 69 years of age. You are older than 70 years of age and your health care provider tells you that you are at risk for this type of infection. Your sexual activity has changed since you were last screened, and you are at increased risk for chlamydia or gonorrhea. Ask your health care provider if you are at risk. Ask your health care provider about whether you are at high risk for HIV. Your health care provider may recommend a prescription medicine to help prevent HIV infection. If you choose to take medicine to prevent HIV, you should first get tested for HIV. You should then be tested every 3 months for as long as you are taking the medicine. Pregnancy If you are about to stop having your period (premenopausal) and you may become pregnant, seek counseling before you get pregnant. Take 400 to 800 micrograms (mcg) of folic acid every day if you become pregnant. Ask for birth control (contraception) if you want to prevent pregnancy. Osteoporosis and menopause Osteoporosis is a disease in which the bones lose minerals and strength with aging. This can result in bone fractures. If you are 66 years old  or older, or if you are at risk for osteoporosis and fractures, ask your health care provider if you should: Be screened for bone loss. Take a calcium or vitamin D supplement to lower your risk of fractures. Be given hormone replacement therapy (HRT) to treat symptoms of menopause. Follow these instructions at home: Lifestyle Do not use any products that contain nicotine  or tobacco, such as cigarettes, e-cigarettes, and chewing tobacco. If you need help quitting, ask your health care provider. Do not use street drugs. Do not share needles. Ask your health care provider for help if you need support or information about quitting drugs. Alcohol use Do not drink alcohol if: Your health care provider tells you not to drink. You are pregnant, may be pregnant, or are planning to become pregnant. If you drink alcohol: Limit how much you use to 0-1 drink a day. Limit intake if you are breastfeeding. Be aware of how much alcohol is in your drink. In the [place] one drink equals one 12 oz bottle of beer (355 mL), one 5 oz glass of wine (148 mL), or one 1 oz glass of hard liquor (44 mL). General instructions Schedule regular health, dental, and eye exams. Stay current with your vaccines. Tell your health care provider if: You often feel depressed. You have ever been abused or do not feel safe at home. Summary Adopting a healthy lifestyle and getting preventive care are important in promoting health and wellness. Follow your health care provider's instructions about healthy diet, exercising, and getting tested or screened for diseases. Follow your health care provider's instructions on monitoring your cholesterol and blood pressure. This information is not intended to replace advice given to you by your health care provider. Make sure you discuss any questions you have with your healthcare provider. Document Revised: 11/24/2018 Document Reviewed: 11/24/2018 Elsevier Patient Education  2022 [employer].

## 2021-06-27 NOTE — Progress Notes (Signed)
Subjective:   Gail Gilbert is a 70 y.o. female who presents for Medicare Annual (Subsequent) preventive examination.  Review of Systems    Review of Systems  Constitutional: Negative.   HENT: Negative.    Eyes: Negative.   Respiratory: Negative.    Cardiovascular: Negative.   Gastrointestinal: Negative.   Genitourinary: Negative.   Musculoskeletal: Negative.   Skin: Negative.   Neurological: Negative.   Endo/Heme/Allergies: Negative.   Psychiatric/Behavioral: Negative.     Cardiac Risk Factors include: advanced age (>21men, >53 women);dyslipidemia     Objective:    Today's Vitals   06/27/21 0939  BP: 120/72  Pulse: 71  Resp: 18  Temp: 97.9 F (36.6 C)  SpO2: 97%  Weight: 145 lb (65.8 kg)  Height: 5\' 7"  (1.702 m)   Body mass index is 22.71 kg/m.  Advanced Directives 06/27/2021 09/29/2018 08/19/2017 03/25/2017 01/14/2017 12/24/2016 08/28/2016  Does Patient Have a Medical Advance Directive? No No No No No No No  Would patient like information on creating a medical advance directive? No - Patient declined No - Patient declined No - Patient declined No - Patient declined - - No - patient declined information  Pre-existing out of facility DNR order (yellow form or pink MOST form) - - - - - - -    Current Medications (verified) Outpatient Encounter Medications as of 06/27/2021  Medication Sig   acetaminophen (TYLENOL) 500 MG tablet Take 500 mg by mouth every 6 (six) hours as needed.   amoxicillin (AMOXIL) 500 MG capsule SMARTSIG:1 Caplet By Mouth 3 Times Daily   atorvastatin (LIPITOR) 20 MG tablet TAKE 1 TABLET(20 MG) BY MOUTH DAILY   famotidine (PEPCID) 20 MG tablet Take 1 tablet (20 mg total) by mouth daily.   gabapentin (NEURONTIN) 300 MG capsule TAKE 1 CAPSULE(300 MG) BY MOUTH AT BEDTIME   sertraline (ZOLOFT) 25 MG tablet TAKE 1 TABLET(25 MG) BY MOUTH DAILY   traZODone (DESYREL) 50 MG tablet TAKE 1 TABLET(50 MG) BY MOUTH AT BEDTIME   No facility-administered encounter  medications on file as of 06/27/2021.    Allergies (verified) Antihistamines, diphenhydramine-type; Omeprazole; and Pseudoephedrine hcl   History: Past Medical History:  Diagnosis Date   Cataract, immature    Dental crowns present    GERD (gastroesophageal reflux disease)    Hammer toe of right foot 07/2016   2nd and 3rd toes   IBS (irritable bowel syndrome)    no current problems   Idiopathic transverse myelitis ([place]) 2014   residual numbness feet and right small finger   Osteoarthritis    cervical spine, lumbar spine   RLS (restless legs syndrome)    no current med.   Past Surgical History:  Procedure Laterality Date   BUNIONECTOMY Bilateral    COLONOSCOPY     COLONOSCOPY WITH PROPOFOL  11/26/2015   FOOT SURGERY     HAMMER TOE SURGERY  10/29/2011   Procedure: HAMMER TOE CORRECTION;  Surgeon: Kerin Salen;  Location: [facility];  Service: Orthopedics;  Laterality: Left;  Left 2nd toe duvries arthroplasty   HAMMER TOE SURGERY Right 07/23/2016   Procedure: HAMMER TOE CORRECTION [place];  Surgeon: Frederik Pear, MD;  Location: [facility];  Service: Orthopedics;  Laterality: Right;   MIDDLE EAR SURGERY     to relieve ringing in ear   POLYPECTOMY     SYNOVIAL CYST EXCISION     lumbar back   Family History  Problem Relation Age of Onset  Arthritis Mother        spinal stenosis   Cataracts Mother    Heart disease Mother        pacemaker   Hypertension Father    Heart disease Father        pacer/defibrillator   Congestive Heart Failure Father    Social History   Socioeconomic History   Marital status: Married    Spouse name: Tom   Number of children: 1   Years of education: college   Highest education level: Not on file  Occupational History   Occupation: unemployed    Comment: former Secretary/administrator  Tobacco Use   Smoking status: Never   Smokeless tobacco: Never  Vaping Use   Vaping Use: Never used  Substance and Sexual  Activity   Alcohol use: Yes    Alcohol/week: 0.0 standard drinks    Comment: occasionally   Drug use: No   Sexual activity: Yes    Partners: Male    Birth control/protection: Post-menopausal  Other Topics Concern   Not on file  Social History Narrative   Divorced.   Lives alone.   Adult children live in town.  Her parents are in [place]    Patient is right handed   Education level is some college   Caffeine consumption is a couple of cups a day          Social Determinants of Radio broadcast assistant Strain: Low Risk    Difficulty of Paying Living Expenses: Not very hard  Food Insecurity: No Food Insecurity   Worried About Charity fundraiser in the Last Year: Never true   Arboriculturist in the Last Year: Never true  Transportation Needs: No Transportation Needs   Lack of Transportation (Medical): No   Lack of Transportation (Non-Medical): No  Physical Activity: Sufficiently Active   Days of Exercise per Week: 7 days   Minutes of Exercise per Session: 30 min  Stress: Stress Concern Present   Feeling of Stress : To some extent  Social Connections: Engineer, building services of Communication with Friends and Family: More than three times a week   Frequency of Social Gatherings with Friends and Family: Once a week   Attends Religious Services: More than 4 times per year   Active Member of [employer] or Organizations: Yes   Attends Music therapist: More than 4 times per year   Marital Status: Married    Tobacco Counseling Counseling given: Yes   Clinical Intake:  Pre-visit preparation completed: No    BMI - recorded: 22.71 Nutritional Status: BMI of 19-24  Normal Nutritional Risks: None Diabetes: No  How often do you need to have someone help you when you read instructions, pamphlets, or other written materials from your doctor or pharmacy?: 1 - Never What is the last grade level you completed in school?: 12th plus some colleges  courses  Diabetic?no  Interpreter Needed?: No      Activities of Daily Living In your present state of health, do you have any difficulty performing the following activities: 06/27/2021  Hearing? N  Vision? N  Difficulty concentrating or making decisions? N  Walking or climbing stairs? N  Dressing or bathing? N  Doing errands, shopping? N  Preparing Food and eating ? N  Using the Toilet? N  In the past six months, have you accidently leaked urine? N  Do you have problems with loss of bowel control? N  Managing your  Medications? N  Managing your Finances? N  Housekeeping or managing your Housekeeping? N  Some recent data might be hidden    Patient Care Team: Charlott Rakes, MD as PCP - General (Family Medicine)  Indicate any recent Medical Services you may have received from other than [facility] providers in the past year (date may be approximate).     Assessment:   This is a routine wellness examination for [place].  Hearing/Vision screen No results found.  Dietary issues and exercise activities discussed: Current Exercise Habits: Home exercise routine, Type of exercise: walking, Time (Minutes): 30, Frequency (Times/Week): 7, Weekly Exercise (Minutes/Week): 210   Goals Addressed   None    Depression Screen PHQ 2/9 Scores 06/27/2021 09/11/2020 12/27/2019 09/29/2018 03/16/2018 08/19/2017 03/25/2017  PHQ - 2 Score 0 0 0 0 0 0 0  PHQ- 9 Score - 0 0 0 0 - 0  Exception Documentation - - - - - - -    Fall Risk Fall Risk  06/27/2021 09/11/2020 09/29/2018 03/16/2018 08/28/2016  Falls in the past year? 1 0 No No No  Number falls in past yr: 0 - - - -  Injury with Fall? 0 - - - -  Risk for fall due to : History of fall(s) No Fall Risks - - -  Follow up Education provided - - - -    FALL RISK PREVENTION PERTAINING TO THE HOME:  Any stairs in or around the home? Yes  If so, are there any without handrails? No  Home free of loose throw rugs in walkways, pet beds, electrical cords, etc?  Yes  Adequate lighting in your home to reduce risk of falls? Yes   ASSISTIVE DEVICES UTILIZED TO PREVENT FALLS:  Life alert? No  Use of a cane, walker or w/c? No  Grab bars in the bathroom? No  Shower chair or bench in shower? No  Elevated toilet seat or a handicapped toilet? No   TIMED UP AND GO:  Was the test performed? Yes .  Length of time to ambulate 10 feet: 3 sec.   Gait steady and fast without use of assistive device  Cognitive Function:        Immunizations Immunization History  Administered Date(s) Administered   Pneumococcal Conjugate-13 08/19/2017   Pneumococcal Polysaccharide-23 06/06/2016   Tdap 12/15/2007, 10/28/2017   Zoster Recombinat (Shingrix) 08/19/2017, 10/28/2017    TDAP status: Up to date  Flu Vaccine status: Declined, Education has been provided regarding the importance of this vaccine but patient still declined. Advised may receive this vaccine at local pharmacy or Health Dept. Aware to provide a copy of the vaccination record if obtained from local pharmacy or Health Dept. Verbalized acceptance and understanding.  Pneumococcal vaccine status: Due, Education has been provided regarding the importance of this vaccine. Advised may receive this vaccine at local pharmacy or Health Dept. Aware to provide a copy of the vaccination record if obtained from local pharmacy or Health Dept. Verbalized acceptance and understanding.  Covid-19 vaccine status: Declined, Education has been provided regarding the importance of this vaccine but patient still declined. Advised may receive this vaccine at local pharmacy or Health Dept.or vaccine clinic. Aware to provide a copy of the vaccination record if obtained from local pharmacy or Health Dept. Verbalized acceptance and understanding.  Qualifies for Shingles Vaccine? Yes   Zostavax completed Yes   Shingrix Completed?: Yes  Screening Tests Health Maintenance  Topic Date Due   COVID-19 Vaccine (1) Never done    PNA  vac Low Risk Adult (2 of 2 - PPSV23) 06/06/2021   INFLUENZA VACCINE  07/15/2021   MAMMOGRAM  09/04/2022   COLONOSCOPY (Pts 45-56yrs Insurance coverage will need to be confirmed)  11/25/2025   TETANUS/TDAP  10/29/2027   DEXA SCAN  Completed   Hepatitis C Screening  Completed   Zoster Vaccines- Shingrix  Completed   HPV VACCINES  Aged Out    Health Maintenance  Health Maintenance Due  Topic Date Due   COVID-19 Vaccine (1) Never done   PNA vac Low Risk Adult (2 of 2 - PPSV23) 06/06/2021    Colorectal cancer screening: Type of screening: Colonoscopy. Completed  . Repeat every 5 years  Mammogram status: Completed 2021. Repeat every year  Bone Density status: Completed  . Results reflect: Bone density results: NORMAL. Repeat every 5 years.  Lung Cancer Screening: (Low Dose CT Chest recommended if Age 9-80 years, 30 pack-year currently smoking OR have quit w/in 15years.) does not qualify.   Lung Cancer Screening Referral: na  Additional Screening:  Hepatitis C Screening: does qualify; Completed   Vision Screening: Recommended annual ophthalmology exams for early detection of glaucoma and other disorders of the eye. Is the patient up to date with their annual eye exam?  Yes  Who is the provider or what is the name of the office in which the patient attends annual eye exams? Dr. Katy Fitch If pt is not established with a provider, would they like to be referred to a provider to establish care?  na .   Dental Screening: Recommended annual dental exams for proper oral hygiene  Community Resource Referral / Chronic Care Management: CRR required this visit?  No   CCM required this visit?  No      Plan:     I have personally reviewed and noted the following in the patient's chart:   Medical and social history Use of alcohol, tobacco or illicit drugs  Current medications and supplements including opioid prescriptions.  Functional ability and status Nutritional  status Physical activity Advanced directives List of other physicians Hospitalizations, surgeries, and ER visits in previous 12 months Vitals Screenings to include cognitive, depression, and falls Referrals and appointments  In addition, I have reviewed and discussed with patient certain preventive protocols, quality metrics, and best practice recommendations. A written personalized care plan for preventive services as well as general preventive health recommendations were provided to patient.     Fenton Foy, NP   06/27/2021

## 2021-08-03 ENCOUNTER — Other Ambulatory Visit: Payer: Self-pay | Admitting: Family Medicine

## 2021-08-03 DIAGNOSIS — G0491 Myelitis, unspecified: Secondary | ICD-10-CM

## 2021-08-03 DIAGNOSIS — F419 Anxiety disorder, unspecified: Secondary | ICD-10-CM

## 2021-08-03 DIAGNOSIS — F32A Depression, unspecified: Secondary | ICD-10-CM

## 2021-08-03 NOTE — Telephone Encounter (Signed)
Requested Prescriptions  Pending Prescriptions Disp Refills  . sertraline (ZOLOFT) 25 MG tablet [Pharmacy Med Name: SERTRALINE '25MG'$  TABLETS] 11 tablet 0    Sig: TAKE 1 TABLET(25 MG) BY MOUTH DAILY     Psychiatry:  Antidepressants - SSRI Failed - 08/03/2021 10:01 AM      Failed - Valid encounter within last 6 months    Recent Outpatient Visits          10 months ago Muscle spasm   [place], Enobong, MD   1 year ago Screening for metabolic disorder   [place] Charlott Rakes, MD   2 years ago Myelitis [facility])   [place], [place], [place]   3 years ago Idiopathic transverse myelitis [facility])   [place], Enobong, MD   3 years ago Need for 23-polyvalent pneumococcal polysaccharide vaccine   [place], Marlena Clipper, MD      Future Appointments            In 1 week Charlott Rakes, MD [place] - Completed PHQ-2 or PHQ-9 in the last 360 days

## 2021-08-11 ENCOUNTER — Telehealth: Payer: Self-pay

## 2021-08-11 NOTE — Telephone Encounter (Signed)
LVM for pt call and schedule AWV   ([place] min nurse visit on 09/11 8-12 or 09/11 1-4   AWV-K[place]

## 2021-08-13 ENCOUNTER — Other Ambulatory Visit: Payer: Self-pay

## 2021-08-13 ENCOUNTER — Ambulatory Visit: Payer: Medicare HMO | Attending: Family Medicine | Admitting: Family Medicine

## 2021-08-13 ENCOUNTER — Encounter: Payer: Self-pay | Admitting: Family Medicine

## 2021-08-13 DIAGNOSIS — F32A Depression, unspecified: Secondary | ICD-10-CM | POA: Diagnosis not present

## 2021-08-13 DIAGNOSIS — E785 Hyperlipidemia, unspecified: Secondary | ICD-10-CM | POA: Diagnosis not present

## 2021-08-13 DIAGNOSIS — G629 Polyneuropathy, unspecified: Secondary | ICD-10-CM | POA: Diagnosis not present

## 2021-08-13 DIAGNOSIS — R102 Pelvic and perineal pain: Secondary | ICD-10-CM | POA: Diagnosis not present

## 2021-08-13 DIAGNOSIS — G4700 Insomnia, unspecified: Secondary | ICD-10-CM | POA: Diagnosis not present

## 2021-08-13 DIAGNOSIS — F419 Anxiety disorder, unspecified: Secondary | ICD-10-CM

## 2021-08-13 DIAGNOSIS — G0491 Myelitis, unspecified: Secondary | ICD-10-CM

## 2021-08-13 DIAGNOSIS — R69 Illness, unspecified: Secondary | ICD-10-CM | POA: Diagnosis not present

## 2021-08-13 DIAGNOSIS — R109 Unspecified abdominal pain: Secondary | ICD-10-CM | POA: Diagnosis not present

## 2021-08-13 DIAGNOSIS — E7800 Pure hypercholesterolemia, unspecified: Secondary | ICD-10-CM | POA: Diagnosis not present

## 2021-08-13 MED ORDER — TRAZODONE HCL 50 MG PO TABS: 50.0000 mg | ORAL_TABLET | Freq: Every day | ORAL | 0 refills | Status: AC

## 2021-08-13 NOTE — Progress Notes (Signed)
Virtual Visit via Video Note  I connected with Coral Spikes, on 08/13/2021 at 3:18 PM by video enabled telemedicine device due to the COVID-19 pandemic and verified that I am speaking with the correct person using two identifiers.   Consent: I discussed the limitations, risks, security and privacy concerns of performing an evaluation and management service by telemedicine and the availability of in person appointments. I also discussed with the patient that there may be a patient responsible charge related to this service. The patient expressed understanding and agreed to proceed.   Location of Patient: Environmental education officer of Provider: Clinic   Persons participating in Telemedicine visit: Coral Spikes Dr. Margarita Rana     History of Present Illness: Gail Gilbert is a 70 y.o. year old female  with a history of anxiety and depression, idiopathic transverse myelitis, hyperlipidemia seen today for follow-up visit.   Her transverse myelitis has been stable but she continues to experience some balance issues and some numbness in her feet.  She is on gabapentin for this. She requests a refill of trazodone for her insomnia and uses as needed.  Remains on Zoloft for her anxiety and depression.  She is currently in [place] visiting her son and plans to return to [place] in 10/2020.  She recently saw a physician over there and had a cholesterol checked.  She had also complained of lower abdominal pain and pelvic ultrasound was ordered. Past Medical History:  Diagnosis Date   Cataract, immature    Dental crowns present    GERD (gastroesophageal reflux disease)    Hammer toe of right foot 07/2016   2nd and 3rd toes   IBS (irritable bowel syndrome)    no current problems   Idiopathic transverse myelitis ([place]) 2014   residual numbness feet and right small finger   Osteoarthritis    cervical spine, lumbar spine   RLS (restless legs syndrome)    no current med.   Allergies  Allergen  Reactions   Antihistamines, Diphenhydramine-Type Palpitations   Omeprazole     Joint pain, flu-like symptoms   Pseudoephedrine Hcl Other (See Comments)    Current Outpatient Medications on File Prior to Visit  Medication Sig Dispense Refill   acetaminophen (TYLENOL) 500 MG tablet Take 500 mg by mouth every 6 (six) hours as needed.     amoxicillin (AMOXIL) 500 MG capsule SMARTSIG:1 Caplet By Mouth 3 Times Daily     atorvastatin (LIPITOR) 20 MG tablet TAKE 1 TABLET(20 MG) BY MOUTH DAILY 90 tablet 0   famotidine (PEPCID) 20 MG tablet Take 1 tablet (20 mg total) by mouth daily. 30 tablet 5   gabapentin (NEURONTIN) 300 MG capsule TAKE 1 CAPSULE(300 MG) BY MOUTH AT BEDTIME 90 capsule 0   sertraline (ZOLOFT) 25 MG tablet TAKE 1 TABLET(25 MG) BY MOUTH DAILY 11 tablet 0   traZODone (DESYREL) 50 MG tablet TAKE 1 TABLET(50 MG) BY MOUTH AT BEDTIME 17 tablet 0   No current facility-administered medications on file prior to visit.    ROS: See HPI  Observations/Objective: Awake, alert, oriented x3 Not in acute distress Normal mood  CMP Latest Ref Rng & Units 09/11/2020 04/26/2020 03/20/2020  Glucose 65 - 99 mg/dL 85 - -  BUN 8 - 27 mg/dL 17 - -  Creatinine 0.57 - 1.00 mg/dL 0.62 - -  Sodium 134 - 144 mmol/L 137 - -  Potassium 3.5 - 5.2 mmol/L 4.8 - -  Chloride 96 - 106 mmol/L 100 - -  CO2 20 - 29 mmol/L 24 - -  Calcium 8.7 - 10.3 mg/dL 9.2 - -  Total Protein 6.0 - 8.5 g/dL 6.4 6.3 6.0(L)  Total Bilirubin 0.0 - 1.2 mg/dL <0.2 0.3 0.4  Alkaline Phos 44 - 121 IU/L 100 - -  AST 0 - 40 IU/L '21 19 20  '$ ALT 0 - 32 IU/L '17 13 16    '$ Lipid Panel     Component Value Date/Time   CHOL 253 (H) 01/03/2020 1011   TRIG 57 01/03/2020 1011   HDL 98 01/03/2020 1011   CHOLHDL 2.6 01/03/2020 1011   CHOLHDL 2.7 12/24/2016 1102   VLDL 14 12/24/2016 1102   LDLCALC 146 (H) 01/03/2020 1011   LABVLDL 9 01/03/2020 1011    Lab Results  Component Value Date   HGBA1C 5.40 09/17/2015     Assessment and  Plan: 1. Anxiety and depression Controlled Continue Zoloft Trazodone as needed for insomnia - traZODone (DESYREL) 50 MG tablet; Take 1 tablet (50 mg total) by mouth at bedtime.  Dispense: 90 tablet; Refill: 0  2. Myelitis ([place]) With residual symptoms of neuropathy and gait abnormalities Symptoms are stable Continue gabapentin  3. Pure hypercholesterolemia Uncontrolled from last set of labs Continue Lipitor We will check a comprehensive metabolic panel and lipid panel when she returns to [facility] in 10/2020 Low-cholesterol diet   Follow Up Instructions: 3 months   I discussed the assessment and treatment plan with the patient. The patient was provided an opportunity to ask questions and all were answered. The patient agreed with the plan and demonstrated an understanding of the instructions.   The patient was advised to call back or seek an in-person evaluation if the symptoms worsen or if the condition fails to improve as anticipated.     I provided 18 minutes total of Telehealth time during this encounter including median intraservice time, reviewing previous notes, investigations, ordering medications, medical decision making, coordinating care and patient verbalized understanding at the end of the visit.     Charlott Rakes, MD, FAAFP. [facility] and [place], [place]   08/13/2021, 3:18 PM

## 2021-09-04 DIAGNOSIS — K7689 Other specified diseases of liver: Secondary | ICD-10-CM | POA: Diagnosis not present

## 2021-09-04 DIAGNOSIS — R102 Pelvic and perineal pain: Secondary | ICD-10-CM | POA: Diagnosis not present

## 2021-09-04 DIAGNOSIS — N281 Cyst of kidney, acquired: Secondary | ICD-10-CM | POA: Diagnosis not present

## 2021-09-24 NOTE — Addendum Note (Signed)
Addended by: Fenton Foy on: 09/24/2021 08:38 AM   Modules accepted: Orders, Level of Service, SmartSet

## 2021-11-28 ENCOUNTER — Other Ambulatory Visit: Payer: Self-pay

## 2021-11-28 ENCOUNTER — Ambulatory Visit (INDEPENDENT_AMBULATORY_CARE_PROVIDER_SITE_OTHER): Payer: Medicare HMO

## 2021-11-28 ENCOUNTER — Other Ambulatory Visit (HOSPITAL_BASED_OUTPATIENT_CLINIC_OR_DEPARTMENT_OTHER): Payer: Self-pay | Admitting: Family Medicine

## 2021-11-28 DIAGNOSIS — Z1231 Encounter for screening mammogram for malignant neoplasm of breast: Secondary | ICD-10-CM

## 2021-11-29 ENCOUNTER — Other Ambulatory Visit: Payer: Self-pay | Admitting: Family Medicine

## 2021-11-29 DIAGNOSIS — R928 Other abnormal and inconclusive findings on diagnostic imaging of breast: Secondary | ICD-10-CM

## 2021-12-05 ENCOUNTER — Encounter: Payer: Self-pay | Admitting: Physician Assistant

## 2021-12-05 ENCOUNTER — Ambulatory Visit: Payer: Medicare HMO | Attending: Physician Assistant | Admitting: Physician Assistant

## 2021-12-05 ENCOUNTER — Other Ambulatory Visit: Payer: Self-pay

## 2021-12-05 VITALS — BP 113/72 | HR 75 | Ht 67.0 in | Wt 147.8 lb

## 2021-12-05 DIAGNOSIS — R928 Other abnormal and inconclusive findings on diagnostic imaging of breast: Secondary | ICD-10-CM | POA: Diagnosis not present

## 2021-12-05 DIAGNOSIS — Z131 Encounter for screening for diabetes mellitus: Secondary | ICD-10-CM

## 2021-12-05 DIAGNOSIS — E7800 Pure hypercholesterolemia, unspecified: Secondary | ICD-10-CM

## 2021-12-05 DIAGNOSIS — K649 Unspecified hemorrhoids: Secondary | ICD-10-CM | POA: Diagnosis not present

## 2021-12-05 MED ORDER — HYDROCORTISONE ACETATE 25 MG RE SUPP: 25.0000 mg | Freq: Two times a day (BID) | RECTAL | 0 refills | Status: AC

## 2021-12-05 NOTE — Progress Notes (Signed)
Pt complain off pain in lower part of back by tailbone in the past 3 weeks.

## 2021-12-05 NOTE — Progress Notes (Signed)
Gail Gilbert, is a 70 y.o. female  RJJ:[id number]  KZS:[id number]  DOB - 1951-06-23  Chief Complaint  Patient presents with   Back Pain       Subjective:   Gail Gilbert is a 70 y.o. female here today for multiple issues.  She is moving to [place] in 3 weeks.  She had her mmg this month and they recommended more specific views but can't get her in until the end of January. She is leaving town after the first week in January.    Also concern for hemorrhoids.  Pain in anal region in the evenings after standing or sitting too long.  No melena or hematochezia.  Colonoscopy 2016 and normal and not recommended again until 2026.   Also wanted me to look at abd U/S results to interpret what the results showed     No problems updated.  ALLERGIES: Allergies  Allergen Reactions   Antihistamines, Diphenhydramine-Type Palpitations   Omeprazole     Joint pain, flu-like symptoms   Pseudoephedrine Hcl Other (See Comments)    PAST MEDICAL HISTORY: Past Medical History:  Diagnosis Date   Cataract, immature    Dental crowns present    GERD (gastroesophageal reflux disease)    Hammer toe of right foot 07/2016   2nd and 3rd toes   IBS (irritable bowel syndrome)    no current problems   Idiopathic transverse myelitis (Gail Gilbert) 2014   residual numbness feet and right small finger   Osteoarthritis    cervical spine, lumbar spine   RLS (restless legs syndrome)    no current med.    MEDICATIONS AT HOME: Prior to Admission medications   Medication Sig Start Date End Date Taking? Authorizing Provider  acetaminophen (TYLENOL) 500 MG tablet Take 500 mg by mouth every 6 (six) hours as needed.   Yes [provider]  atorvastatin (LIPITOR) 20 MG tablet TAKE 1 TABLET(20 MG) BY MOUTH DAILY 05/09/21  Yes [place], Enobong, MD  gabapentin (NEURONTIN) 300 MG capsule TAKE 1 CAPSULE(300 MG) BY MOUTH AT BEDTIME 08/03/21  Yes [place], Enobong, MD  hydrocortisone (ANUSOL-HC) 25 MG suppository Place 1  suppository (25 mg total) rectally 2 (two) times daily. 12/05/21  Yes Gail Caldron M, PA-C  sertraline (ZOLOFT) 25 MG tablet TAKE 1 TABLET(25 MG) BY MOUTH DAILY 08/03/21  Yes Gail Rakes, MD  traZODone (DESYREL) 50 MG tablet Take 1 tablet (50 mg total) by mouth at bedtime. 08/13/21  Yes Gail Rakes, MD  amoxicillin (AMOXIL) 500 MG capsule SMARTSIG:1 Caplet By Mouth 3 Times Daily Patient not taking: Reported on 12/05/2021 01/21/21   [provider]  famotidine (PEPCID) 20 MG tablet Take 1 tablet (20 mg total) by mouth daily. Patient not taking: Reported on 12/05/2021 03/05/21   Gail Craze, NP    ROS: Neg HEENT Neg resp Neg cardiac Neg GI Neg GU Neg MS Neg psych Neg neuro  Objective:   Vitals:   12/05/21 1419  BP: 113/72  Pulse: 75  SpO2: 96%  Weight: 147 lb 12.8 oz (67 kg)  Height: 5\' 7"  (1.702 Gilbert)   Exam General appearance : Awake, alert, not in any distress. Speech Clear. Not toxic looking HEENT: Atraumatic and Normocephalic Neck: Supple, no JVD. No cervical lymphadenopathy.  Chest: Good air entry bilaterally, CTAB.  No rales/rhonchi/wheezing CVS: S1 S2 regular, no murmurs.  Abdomen: Bowel sounds present, Non tender and not distended with no gaurding, rigidity or rebound. Extremities: B/L Lower Ext shows no edema, both legs are warm to  touch Neurology: Awake alert, and oriented X 3, CN II-XII intact, Non focal Skin: No Rash  Data Review Lab Results  Component Value Date   HGBA1C 5.40 09/17/2015    Assessment & Plan   1. Abnormal mammogram Sent a message to Dr Gail Gilbert to help coordinate follow up since patient is moving - Comprehensive metabolic panel  2. Pure hypercholesterolemia - Lipid panel - Comprehensive metabolic panel  3. Screening for diabetes mellitus I have had a lengthy discussion and provided education about insulin resistance and the intake of too much sugar/refined carbohydrates.  I have advised the patient to work at a goal  of eliminating sugary drinks, candy, desserts, sweets, refined sugars, processed foods, and white carbohydrates.  The patient expresses understanding.   - Hemoglobin A1c  4. Hemorrhoids, unspecified hemorrhoid type - Comprehensive metabolic panel - hydrocortisone (ANUSOL-HC) 25 MG suppository; Place 1 suppository (25 mg total) rectally 2 (two) times daily.  Dispense: 12 suppository; Refill: 0 -f/up with new PCP in [place];  possible GI referral if conservative management doesn't help.      Patient have been counseled extensively about nutrition and exercise. Other issues discussed during this visit include: low cholesterol diet, weight control and daily exercise, foot care, annual eye examinations at Ophthalmology, importance of adherence with medications and regular follow-up. We also discussed long term complications of uncontrolled diabetes and hypertension.   Return if symptoms worsen or fail to improve.  The patient was given clear instructions to go to ER or return to medical center if symptoms don't improve, worsen or new problems develop. The patient verbalized understanding. The patient was told to call to get lab results if they haven't heard anything in the next week.      Gail Caldron, PA-C [facility] and [place], [place]   12/05/2021, 3:15 PM Patient ID: Gail Gilbert, female   DOB: 1951-01-07, 70 y.o.   MRN: [id number]

## 2021-12-06 DIAGNOSIS — Z888 Allergy status to other drugs, medicaments and biological substances status: Secondary | ICD-10-CM | POA: Diagnosis not present

## 2021-12-06 DIAGNOSIS — Z8249 Family history of ischemic heart disease and other diseases of the circulatory system: Secondary | ICD-10-CM | POA: Diagnosis not present

## 2021-12-06 DIAGNOSIS — G8929 Other chronic pain: Secondary | ICD-10-CM | POA: Diagnosis not present

## 2021-12-06 DIAGNOSIS — Z9181 History of falling: Secondary | ICD-10-CM | POA: Diagnosis not present

## 2021-12-06 DIAGNOSIS — G629 Polyneuropathy, unspecified: Secondary | ICD-10-CM | POA: Diagnosis not present

## 2021-12-06 DIAGNOSIS — Z823 Family history of stroke: Secondary | ICD-10-CM | POA: Diagnosis not present

## 2021-12-06 DIAGNOSIS — E785 Hyperlipidemia, unspecified: Secondary | ICD-10-CM | POA: Diagnosis not present

## 2021-12-06 DIAGNOSIS — F325 Major depressive disorder, single episode, in full remission: Secondary | ICD-10-CM | POA: Diagnosis not present

## 2021-12-06 DIAGNOSIS — M1990 Unspecified osteoarthritis, unspecified site: Secondary | ICD-10-CM | POA: Diagnosis not present

## 2021-12-06 DIAGNOSIS — G4700 Insomnia, unspecified: Secondary | ICD-10-CM | POA: Diagnosis not present

## 2021-12-06 DIAGNOSIS — R69 Illness, unspecified: Secondary | ICD-10-CM | POA: Diagnosis not present

## 2021-12-06 LAB — COMPREHENSIVE METABOLIC PANEL
ALT: 14 IU/L (ref 0–32)
AST: 22 IU/L (ref 0–40)
Albumin/Globulin Ratio: 2.2 (ref 1.2–2.2)
Albumin: 4.7 g/dL (ref 3.8–4.8)
Alkaline Phosphatase: 102 IU/L (ref 44–121)
BUN/Creatinine Ratio: 24 (ref 12–28)
BUN: 18 mg/dL (ref 8–27)
Bilirubin Total: <0.2 mg/dL (ref 0.0–1.2)
CO2: 25 mmol/L (ref 20–29)
Calcium: 9.3 mg/dL (ref 8.7–10.3)
Chloride: 104 mmol/L (ref 96–106)
Creatinine, Ser: 0.76 mg/dL (ref 0.57–1.00)
Globulin, Total: 2.1 g/dL (ref 1.5–4.5)
Glucose: 106 mg/dL — ABNORMAL HIGH (ref 70–99)
Potassium: 4.3 mmol/L (ref 3.5–5.2)
Sodium: 141 mmol/L (ref 134–144)
Total Protein: 6.8 g/dL (ref 6.0–8.5)
eGFR: 85 mL/min/{1.73_m2} (ref >59)

## 2021-12-06 LAB — HEMOGLOBIN A1C
Est. average glucose Bld gHb Est-mCnc: 128 mg/dL
Hgb A1c MFr Bld: 6.1 % — ABNORMAL HIGH (ref 4.8–5.6)

## 2021-12-06 LAB — LIPID PANEL
Chol/HDL Ratio: 2.4 ratio (ref 0.0–4.4)
Cholesterol, Total: 194 mg/dL (ref 100–199)
HDL: 81 mg/dL (ref >39)
LDL Chol Calc (NIH): 101 mg/dL — ABNORMAL HIGH (ref 0–99)
Triglycerides: 65 mg/dL (ref 0–149)
VLDL Cholesterol Cal: 12 mg/dL (ref 5–40)

## 2021-12-25 ENCOUNTER — Ambulatory Visit
Admission: RE | Admit: 2021-12-25 | Discharge: 2021-12-25 | Disposition: A | Discharge status: Home / Self Care | Payer: Medicare HMO | Source: Ambulatory Visit | Attending: Family Medicine | Admitting: Family Medicine

## 2021-12-25 DIAGNOSIS — R922 Inconclusive mammogram: Secondary | ICD-10-CM | POA: Diagnosis not present

## 2021-12-25 DIAGNOSIS — R928 Other abnormal and inconclusive findings on diagnostic imaging of breast: Secondary | ICD-10-CM

## 2022-01-13 ENCOUNTER — Other Ambulatory Visit: Payer: Medicare HMO

## 2022-01-22 ENCOUNTER — Other Ambulatory Visit: Payer: Medicare HMO

## 2022-07-29 ENCOUNTER — Telehealth: Payer: Self-pay

## 2022-07-29 NOTE — Telephone Encounter (Signed)
Call placed to patient and VM was left informing patient to return call to schedule AWV over the phone.

## 2022-12-04 ENCOUNTER — Telehealth: Payer: Self-pay | Admitting: Family Medicine

## 2022-12-04 NOTE — Telephone Encounter (Signed)
Tried calling patient to schedule Medicare Annual Wellness Visit (AWV) either virtually or phone  my jabber number [phone]  No answer  Last AWV 06/27/21 please schedule with Nurse Health Adviser   45 min for awv-i and in office appointments 30 min for awv-s  phone/virtual appointments

## 2023-02-13 ENCOUNTER — Telehealth: Payer: Self-pay | Admitting: Family Medicine

## 2023-02-13 NOTE — Telephone Encounter (Signed)
Reason for CRM: Called patient to schedule Medicare Annual Wellness Visit (AWV). Left message for patient to call back and schedule Medicare Annual Wellness Visit (AWV).  Last date of AWV: 06/27/21   If any questions, please contact me at [phone].  Thank you ,  Barkley Boards AWV direct phone # [phone]
